# Patient Record
Sex: Female | Born: 1950 | Race: Black or African American | Hispanic: No | Marital: Married | Smoking: Former smoker
Health system: Southern US, Community
[De-identification: ages and names within clinical notes are randomized; demographics above are authoritative.]

## PROBLEM LIST (undated history)

## (undated) DIAGNOSIS — H269 Unspecified cataract: Secondary | ICD-10-CM

## (undated) DIAGNOSIS — H409 Unspecified glaucoma: Secondary | ICD-10-CM

## (undated) DIAGNOSIS — R109 Unspecified abdominal pain: Secondary | ICD-10-CM

## (undated) DIAGNOSIS — E559 Vitamin D deficiency, unspecified: Secondary | ICD-10-CM

## (undated) DIAGNOSIS — M1990 Unspecified osteoarthritis, unspecified site: Secondary | ICD-10-CM

## (undated) DIAGNOSIS — M549 Dorsalgia, unspecified: Secondary | ICD-10-CM

## (undated) DIAGNOSIS — E119 Type 2 diabetes mellitus without complications: Secondary | ICD-10-CM

## (undated) DIAGNOSIS — I10 Essential (primary) hypertension: Secondary | ICD-10-CM

## (undated) DIAGNOSIS — K219 Gastro-esophageal reflux disease without esophagitis: Secondary | ICD-10-CM

## (undated) HISTORY — DX: Unspecified glaucoma: H40.9

## (undated) HISTORY — PX: CHOLECYSTECTOMY: SHX55

## (undated) HISTORY — DX: Unspecified cataract: H26.9

## (undated) HISTORY — DX: Type 2 diabetes mellitus without complications: E11.9

## (undated) HISTORY — PX: CYST REMOVAL NECK: SHX6281

## (undated) HISTORY — PX: ABDOMINAL HYSTERECTOMY: SHX81

## (undated) NOTE — *Deleted (*Deleted)
[facility] [address] [place], [place] [address]  DATE OF SERVICE: March 02, 2020  CAROTID DOPPLER INTERPRETATION:  Bilateral Carotid Ultrsasound and Color Doppler Examination was performed. The RIGHT CCA shows mild to moderate plaque with right internal carotid artery being occluded mid to distal section. The LEFT CCA shows mild plaque in the vessel. There was no significant intimal thickening noted in the RIGHT carotid artery. There was no significant intimal thickening in the LEFT carotid artery.  The RIGHT CCA shows peak systolic velocity of 126 cm per second. The end diastolic velocity is 22 cm per second on the RIGHT side. The RIGHT ICA shows peak systolic velocity of 41 per second. RIGHT sided ICA end diastolic velocity is 15 cm per second. The RIGHT ECA shows a peak systolic velocity of 104 cm per second. The ICA/CCA ratio is calculated to be 0.3. This suggests less than 50% stenosis however the mid to distal internal carotid artery is fully occluded.. The Vertebral Artery shows antegrade flow.  The LEFT CCA shows peak systolic velocity of 118 cm per second. The end diastolic velocity is 36 cm per second on the LEFT side. The LEFT ICA shows peak systolic velocity of 80 per second. LEFT sided ICA end diastolic velocity is 22 cm per second. The LEFT ECA shows a peak systolic velocity of 112 cm per second. The ICA/CCA ratio is calculated to be 0.7. This suggests less than 50% stenosis. The Vertebral Artery shows antegrade flow.   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 The LEFT CAROTID shows less than 50% stenosis.  There is mild plaque formation noted on the LEFT and mild plaque with totally occluded internal carotid artery on the RIGHT  side. Consider a repeat Carotid doppler if clinical situation and symptoms warrant in 6-12 months. Patient should be encouraged to change lifestyles such as smoking  cessation, regular exercise and dietary modification. Use of statins in the right clinical setting and ASA is encouraged.  Saadat A Khan, MD FCCP Pulmonary Critical Care Medicine   

---

## 2008-02-11 ENCOUNTER — Ambulatory Visit: Payer: Self-pay

## 2009-01-30 ENCOUNTER — Ambulatory Visit: Payer: Self-pay | Admitting: Internal Medicine

## 2009-03-07 ENCOUNTER — Ambulatory Visit: Payer: Self-pay | Admitting: Internal Medicine

## 2009-04-09 ENCOUNTER — Ambulatory Visit: Payer: Self-pay | Admitting: Internal Medicine

## 2009-07-19 ENCOUNTER — Ambulatory Visit: Payer: Self-pay | Admitting: Gastroenterology

## 2010-03-22 ENCOUNTER — Ambulatory Visit: Payer: Self-pay

## 2010-06-25 ENCOUNTER — Ambulatory Visit: Payer: Self-pay | Admitting: Internal Medicine

## 2011-07-31 ENCOUNTER — Ambulatory Visit: Payer: Self-pay | Admitting: Internal Medicine

## 2011-08-11 ENCOUNTER — Ambulatory Visit: Payer: Self-pay | Admitting: Internal Medicine

## 2011-08-22 ENCOUNTER — Ambulatory Visit: Payer: Self-pay | Admitting: Emergency Medicine

## 2011-08-22 LAB — HEPATIC FUNCTION PANEL A (ARMC)
Albumin: 3.6 g/dL (ref 3.4–5.0)
Alkaline Phosphatase: 87 U/L (ref 50–136)
Bilirubin, Direct: <0.1 mg/dL (ref 0.00–0.20)
Bilirubin,Total: 0.6 mg/dL (ref 0.2–1.0)
SGOT(AST): 21 U/L (ref 15–37)
SGPT (ALT): 23 U/L
Total Protein: 7.5 g/dL (ref 6.4–8.2)

## 2011-08-28 ENCOUNTER — Ambulatory Visit: Payer: Self-pay | Admitting: Emergency Medicine

## 2011-09-01 LAB — PATHOLOGY REPORT

## 2012-01-23 ENCOUNTER — Ambulatory Visit: Payer: Self-pay | Admitting: Internal Medicine

## 2012-11-17 ENCOUNTER — Ambulatory Visit: Payer: Self-pay | Admitting: Internal Medicine

## 2012-12-29 ENCOUNTER — Emergency Department: Payer: Self-pay | Admitting: Emergency Medicine

## 2012-12-29 LAB — URINALYSIS, COMPLETE
Bacteria: NONE SEEN
Bilirubin,UR: NEGATIVE
Blood: NEGATIVE
Glucose,UR: NEGATIVE mg/dL (ref 0–75)
Ketone: NEGATIVE
Leukocyte Esterase: NEGATIVE
Nitrite: NEGATIVE
Ph: 7 (ref 4.5–8.0)
Protein: NEGATIVE
RBC,UR: 1 /HPF (ref 0–5)
Specific Gravity: 1.01 (ref 1.003–1.030)
Squamous Epithelial: 2
WBC UR: 1 /HPF (ref 0–5)

## 2012-12-29 LAB — COMPREHENSIVE METABOLIC PANEL
Albumin: 3.5 g/dL (ref 3.4–5.0)
Alkaline Phosphatase: 115 U/L (ref 50–136)
Anion Gap: 2 — ABNORMAL LOW (ref 7–16)
BUN: 8 mg/dL (ref 7–18)
Bilirubin,Total: 0.4 mg/dL (ref 0.2–1.0)
Calcium, Total: 8.7 mg/dL (ref 8.5–10.1)
Chloride: 110 mmol/L — ABNORMAL HIGH (ref 98–107)
Co2: 31 mmol/L (ref 21–32)
Creatinine: 0.67 mg/dL (ref 0.60–1.30)
EGFR (African American): >60
EGFR (Non-African Amer.): >60
Glucose: 97 mg/dL (ref 65–99)
Osmolality: 283 (ref 275–301)
Potassium: 3.8 mmol/L (ref 3.5–5.1)
SGOT(AST): 21 U/L (ref 15–37)
SGPT (ALT): 26 U/L (ref 12–78)
Sodium: 143 mmol/L (ref 136–145)
Total Protein: 7.3 g/dL (ref 6.4–8.2)

## 2012-12-29 LAB — CBC
HCT: 37.7 % (ref 35.0–47.0)
HGB: 12.6 g/dL (ref 12.0–16.0)
MCH: 26.6 pg (ref 26.0–34.0)
MCHC: 33.5 g/dL (ref 32.0–36.0)
MCV: 79 fL — ABNORMAL LOW (ref 80–100)
Platelet: 276 10*3/uL (ref 150–440)
RBC: 4.74 10*6/uL (ref 3.80–5.20)
RDW: 15 % — ABNORMAL HIGH (ref 11.5–14.5)
WBC: 7.6 10*3/uL (ref 3.6–11.0)

## 2013-05-19 HISTORY — PX: BREAST EXCISIONAL BIOPSY: SUR124

## 2013-12-02 ENCOUNTER — Ambulatory Visit: Payer: Self-pay | Admitting: Internal Medicine

## 2014-09-10 NOTE — Op Note (Signed)
PATIENT NAME:  Holly Forbes, Holly Forbes MR#:  [id number] DATE OF BIRTH:  1950-06-27  DATE OF PROCEDURE:  08/28/2011  PREOPERATIVE DIAGNOSES:  1. Acute cholecystitis.  2. Cyst on the left chest wall.   POSTOPERATIVE DIAGNOSES:  1. Acute cholecystitis.  2. Cyst on the left chest wall.   PROCEDURES: Excision of cyst on chest wall and laparoscopic cholecystectomy with cholangiogram.   SURGEON: Vella Kohler, MD   INDICATION: The patient was seen by me in my office because of right upper quadrant abdominal pain. She also had a cyst on the left breast skin which was a sebaceous cyst. I told her that we will remove it in surgery.   DESCRIPTION OF PROCEDURE: After she was brought to surgery, under general anesthesia, the abdomen was then prepped and draped. A small incision was made below the umbilicus. After cutting skin and subcutaneous tissue, the fascia was cut and the abdomen was entered. The fascia was then picked up with the sutures. After that the gallbladder was then found. She had a lot of adhesions of the cystic duct to the liver surface area. The cystic artery was small. By making dissection between the cystic duct and the gallbladder, I made a little hole at the distal end of the gallbladder so there was some bile leak from there, but basically the cystic duct was small. The patient had one stone. I tried to attempt to do a cholangiogram, but since there was a hole made near the cystic duct, near the junction of the gallbladder, dye kind of leaked out, so I could not really see the common bile duct, but I was very sure that I was away from it so dissection was done. The cystic duct was then isolated nicely and was then clipped and cut and the gallbladder was then lifted off from the liver. There was a little bit of oozing from the surface of the liver. It was cauterized and the gallbladder was then completely released and was removed from the epigastric port. After that irrigation of the gallbladder was  done and we made sure there was no bleeding or biliary leak noticed. After that the fascia was then closed with interrupted 0 Vicryl sutures. Subcuticular suturing was done and Steri-Strips applied.   After that attention was diverted to the left breast cyst, which is really a skin cyst. It was then completely excised.   The wound was then closed with subcuticular sutures with 4-0 Vicryl. The patient tolerated the procedure well and was sent to the recovery room in satisfactory condition.  ____________________________ Welford Roche Phylis Bougie, MD msh:slb D: 08/28/2011 08:52:20 ET T: 08/28/2011 11:26:27 ET JOB#: [id number]  cc: Mabell Esguerra S. Phylis Bougie, MD, <Dictator> Lavera Guise, MD Sharene Butters MD ELECTRONICALLY SIGNED 09/01/2011 17:16

## 2014-09-22 NOTE — Discharge Instructions (Signed)

## 2014-09-25 ENCOUNTER — Encounter
Admission: RE | Disposition: A | Discharge status: Home / Self Care | Payer: Self-pay | Source: Ambulatory Visit | Attending: Gastroenterology

## 2014-09-25 ENCOUNTER — Ambulatory Visit: Payer: 59 | Admitting: Anesthesiology

## 2014-09-25 ENCOUNTER — Ambulatory Visit
Admission: RE | Admit: 2014-09-25 | Discharge: 2014-09-25 | Disposition: A | Discharge status: Home / Self Care | Payer: 59 | Source: Ambulatory Visit | Attending: Gastroenterology | Admitting: Gastroenterology

## 2014-09-25 ENCOUNTER — Other Ambulatory Visit: Payer: Self-pay | Admitting: Gastroenterology

## 2014-09-25 DIAGNOSIS — E119 Type 2 diabetes mellitus without complications: Secondary | ICD-10-CM | POA: Diagnosis not present

## 2014-09-25 DIAGNOSIS — M1990 Unspecified osteoarthritis, unspecified site: Secondary | ICD-10-CM | POA: Insufficient documentation

## 2014-09-25 DIAGNOSIS — K2940 Chronic atrophic gastritis without bleeding: Secondary | ICD-10-CM | POA: Diagnosis not present

## 2014-09-25 DIAGNOSIS — K5730 Diverticulosis of large intestine without perforation or abscess without bleeding: Secondary | ICD-10-CM | POA: Insufficient documentation

## 2014-09-25 DIAGNOSIS — Z9049 Acquired absence of other specified parts of digestive tract: Secondary | ICD-10-CM | POA: Insufficient documentation

## 2014-09-25 DIAGNOSIS — K640 First degree hemorrhoids: Secondary | ICD-10-CM | POA: Insufficient documentation

## 2014-09-25 DIAGNOSIS — Z79899 Other long term (current) drug therapy: Secondary | ICD-10-CM | POA: Insufficient documentation

## 2014-09-25 DIAGNOSIS — K635 Polyp of colon: Secondary | ICD-10-CM | POA: Diagnosis not present

## 2014-09-25 DIAGNOSIS — Z7982 Long term (current) use of aspirin: Secondary | ICD-10-CM | POA: Insufficient documentation

## 2014-09-25 DIAGNOSIS — Z791 Long term (current) use of non-steroidal anti-inflammatories (NSAID): Secondary | ICD-10-CM | POA: Insufficient documentation

## 2014-09-25 DIAGNOSIS — Z800 Family history of malignant neoplasm of digestive organs: Secondary | ICD-10-CM | POA: Insufficient documentation

## 2014-09-25 DIAGNOSIS — Z1211 Encounter for screening for malignant neoplasm of colon: Secondary | ICD-10-CM | POA: Diagnosis not present

## 2014-09-25 DIAGNOSIS — I10 Essential (primary) hypertension: Secondary | ICD-10-CM | POA: Diagnosis not present

## 2014-09-25 DIAGNOSIS — E669 Obesity, unspecified: Secondary | ICD-10-CM | POA: Diagnosis not present

## 2014-09-25 DIAGNOSIS — Z90710 Acquired absence of both cervix and uterus: Secondary | ICD-10-CM | POA: Diagnosis not present

## 2014-09-25 DIAGNOSIS — K219 Gastro-esophageal reflux disease without esophagitis: Secondary | ICD-10-CM | POA: Diagnosis present

## 2014-09-25 HISTORY — DX: Essential (primary) hypertension: I10

## 2014-09-25 HISTORY — DX: Gastro-esophageal reflux disease without esophagitis: K21.9

## 2014-09-25 HISTORY — DX: Dorsalgia, unspecified: M54.9

## 2014-09-25 HISTORY — DX: Unspecified osteoarthritis, unspecified site: M19.90

## 2014-09-25 HISTORY — PX: COLONOSCOPY: SHX5424

## 2014-09-25 HISTORY — DX: Vitamin D deficiency, unspecified: E55.9

## 2014-09-25 HISTORY — DX: Unspecified abdominal pain: R10.9

## 2014-09-25 HISTORY — PX: POLYPECTOMY: SHX149

## 2014-09-25 HISTORY — PX: ESOPHAGOGASTRODUODENOSCOPY: SHX5428

## 2014-09-25 LAB — GLUCOSE, CAPILLARY: Glucose-Capillary: 125 mg/dL — ABNORMAL HIGH (ref 70–99)

## 2014-09-25 SURGERY — COLONOSCOPY
Anesthesia: Monitor Anesthesia Care | Wound class: Contaminated

## 2014-09-25 MED ORDER — PROPOFOL 10 MG/ML IV BOLUS
INTRAVENOUS | Status: DC | PRN
  Administered 2014-09-25 (×2): 40 mg via INTRAVENOUS
  Administered 2014-09-25: 20 mg via INTRAVENOUS
  Administered 2014-09-25 (×2): 80 mg via INTRAVENOUS
  Administered 2014-09-25: 60 mg via INTRAVENOUS

## 2014-09-25 MED ORDER — LIDOCAINE HCL (CARDIAC) 20 MG/ML IV SOLN
INTRAVENOUS | Status: DC | PRN
  Administered 2014-09-25: 30 mg via INTRAVENOUS

## 2014-09-25 MED ORDER — ACETAMINOPHEN 160 MG/5ML PO SOLN: 325.0000 mg | ORAL | Status: DC | PRN

## 2014-09-25 MED ORDER — SODIUM CHLORIDE 0.9 % IV SOLN
INTRAVENOUS | Status: DC
Start: 2014-09-25 — End: 2014-09-25

## 2014-09-25 MED ORDER — DEXAMETHASONE SODIUM PHOSPHATE 4 MG/ML IJ SOLN: 8.0000 mg | Freq: Once | INTRAMUSCULAR | Status: DC | PRN

## 2014-09-25 MED ORDER — FENTANYL CITRATE (PF) 100 MCG/2ML IJ SOLN: 25.0000 ug | INTRAMUSCULAR | Status: DC | PRN

## 2014-09-25 MED ORDER — ACETAMINOPHEN 325 MG PO TABS: 325.0000 mg | ORAL_TABLET | ORAL | Status: DC | PRN

## 2014-09-25 MED ORDER — OXYCODONE HCL 5 MG/5ML PO SOLN
5.0000 mg | Freq: Once | ORAL | Status: DC | PRN
Start: 2014-09-25 — End: 2014-09-25

## 2014-09-25 MED ORDER — LACTATED RINGERS IV SOLN
INTRAVENOUS | Status: DC
  Administered 2014-09-25 (×2): via INTRAVENOUS

## 2014-09-25 MED ORDER — OXYCODONE HCL 5 MG PO TABS: 5.0000 mg | ORAL_TABLET | Freq: Once | ORAL | Status: DC | PRN

## 2014-09-25 MED ORDER — STERILE WATER FOR IRRIGATION IR SOLN
Status: DC | PRN
  Administered 2014-09-25: 09:00:00

## 2014-09-25 MED ORDER — GLYCOPYRROLATE 0.2 MG/ML IJ SOLN
INTRAMUSCULAR | Status: DC | PRN
  Administered 2014-09-25: 0.2 mg via INTRAVENOUS

## 2014-09-25 SURGICAL SUPPLY — 38 items
BALLN DILATOR 10-12 8 (BALLOONS)
BALLN DILATOR 12-15 8 (BALLOONS)
BALLN DILATOR 15-18 8 (BALLOONS)
BALLN DILATOR CRE 0-12 8 (BALLOONS)
BALLN DILATOR ESOPH 8 10 CRE (MISCELLANEOUS) IMPLANT
BALLOON DILATOR 12-15 8 (BALLOONS) IMPLANT
BALLOON DILATOR 15-18 8 (BALLOONS) IMPLANT
BALLOON DILATOR CRE 0-12 8 (BALLOONS) IMPLANT
BLOCK BITE 60FR ADLT L/F GRN (MISCELLANEOUS) ×3 IMPLANT
CANISTER SUCT 1200ML W/VALVE (MISCELLANEOUS) ×3 IMPLANT
FCP ESCP3.2XJMB 240X2.8X (MISCELLANEOUS) ×2
FORCEPS BIOP RAD 4 LRG CAP 4 (CUTTING FORCEPS) IMPLANT
FORCEPS BIOP RJ4 240 W/NDL (MISCELLANEOUS) ×1
FORCEPS ESCP3.2XJMB 240X2.8X (MISCELLANEOUS) ×2 IMPLANT
GOWN CVR UNV OPN BCK APRN NK (MISCELLANEOUS) ×4 IMPLANT
GOWN ISOL THUMB LOOP REG UNIV (MISCELLANEOUS) ×2
HEMOCLIP INSTINCT (CLIP) IMPLANT
INJECTOR VARIJECT VIN23 (MISCELLANEOUS) IMPLANT
KIT CO2 TUBING (TUBING) ×3 IMPLANT
KIT DEFENDO VALVE AND CONN (KITS) IMPLANT
KIT ENDO PROCEDURE OLY (KITS) ×6 IMPLANT
LIGATOR MULTIBAND 6SHOOTER MBL (MISCELLANEOUS) IMPLANT
MARKER SPOT ENDO TATTOO 5ML (MISCELLANEOUS) IMPLANT
PAD GROUND ADULT SPLIT (MISCELLANEOUS) IMPLANT
SNARE SHORT THROW 13M SML OVAL (MISCELLANEOUS) IMPLANT
SNARE SHORT THROW 30M LRG OVAL (MISCELLANEOUS) IMPLANT
SPOT EX ENDOSCOPIC TATTOO (MISCELLANEOUS)
SUCTION POLY TRAP 4CHAMBER (MISCELLANEOUS) IMPLANT
SYR INFLATION 60ML (SYRINGE) IMPLANT
TRAP SUCTION POLY (MISCELLANEOUS) IMPLANT
TUBING CONN 6MMX3.1M (TUBING)
TUBING SUCTION CONN 0.25 STRL (TUBING) IMPLANT
UNDERPAD 30X60 958B10 (PK) (MISCELLANEOUS) IMPLANT
VALVE BIOPSY ENDO (VALVE) IMPLANT
VARIJECT INJECTOR VIN23 (MISCELLANEOUS)
WATER AUXILLARY (MISCELLANEOUS) IMPLANT
WATER STERILE IRR 500ML POUR (IV SOLUTION) ×3 IMPLANT
WIRE CRE 18-20MM 8CM F G (MISCELLANEOUS) IMPLANT

## 2014-09-25 NOTE — Op Note (Signed)
[facility] Gastroenterology Patient Name: Holly Forbes Procedure Date: 09/25/2014 8:37 AM MRN: [id number] Account #: [id number] Date of Birth: 11-06-1950 Admit Type: Outpatient Age: 64 Room: [facility] OR ROOM 01 Gender: Female Note Status: Finalized Procedure:         Upper GI endoscopy Indications:       Heartburn Providers:         Lucilla Lame, MD Referring MD:      Lavera Guise, MD (Referring MD) Medicines:         Propofol per Anesthesia Complications:     No immediate complications. Procedure:         Pre-Anesthesia Assessment:                    - Prior to the procedure, a History and Physical was                     performed, and patient medications and allergies were                     reviewed. The patient's tolerance of previous anesthesia                     was also reviewed. The risks and benefits of the procedure                     and the sedation options and risks were discussed with the                     patient. All questions were answered, and informed consent                     was obtained. Prior Anticoagulants: The patient has taken                     no previous anticoagulant or antiplatelet agents. ASA                     Grade Assessment: II - A patient with mild systemic                     disease. After reviewing the risks and benefits, the                     patient was deemed in satisfactory condition to undergo                     the procedure.                    After obtaining informed consent, the endoscope was passed                     under direct vision. Throughout the procedure, the                     patient's blood pressure, pulse, and oxygen saturations                     were monitored continuously. The Olympus GIF-HQ190                     Endoscope (S#. [id number]) was introduced through the mouth,  and advanced to the second part of duodenum. The upper GI                     endoscopy was  accomplished without difficulty. The patient                     tolerated the procedure well. Findings:      The examined esophagus was normal.      Localized mild inflammation characterized by erythema was found in the       gastric antrum. Biopsies were taken with a cold forceps for histology.      The examined duodenum was normal. Impression:        - Normal esophagus.                    - Gastritis. Biopsied.                    - Normal examined duodenum. Recommendation:    - Await pathology results.                    - Perform a colonoscopy today. Procedure Code(s): --- Professional ---                    [id number], Esophagogastroduodenoscopy, flexible, transoral;                     with biopsy, single or multiple Diagnosis Code(s): --- Professional ---                    R12, Heartburn                    K29.70, Gastritis, unspecified, without bleeding CPT copyright 2014 American Medical Association. All rights reserved. The codes documented in this report are preliminary and upon coder review may  be revised to meet current compliance requirements. Lucilla Lame, MD 09/25/2014 8:48:50 AM This report has been signed electronically. Number of Addenda: 0 Note Initiated On: 09/25/2014 8:37 AM Total Procedure Duration: 0 hours 1 minute 59 seconds       [facility]

## 2014-09-25 NOTE — H&P (Signed)
  [facility]  [address] [place], [place] [address] Phone: [phone] Fax : [phone]  Primary Care Physician:  Lavera Guise, MD Primary Gastroenterologist:  Dr. Allen Norris  Pre-Procedure History & Physical: HPI:  Holly Forbes is a 64 y.o. female is here for an endoscopy and colonoscopy.   Past Medical History  Diagnosis Date  . Hypertension   . 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 Back pain     Left sciatica, low back pain  . GERD (gastroesophageal reflux disease)   . Vitamin D deficiency   . Abdominal pain     Past Surgical History  Procedure Laterality Date  . Cholecystectomy    . Abdominal hysterectomy    . Cyst removal neck      Prior to Admission medications   Medication Sig Start Date End Date Taking? Authorizing Provider  aspirin 81 MG tablet Take 81 mg by mouth daily. am   Yes Historical Provider, MD  ibuprofen (ADVIL,MOTRIN) 400 MG tablet Take 400 mg by mouth as needed for moderate pain. 2 times/day   Yes Historical Provider, MD  lisinopril (PRINIVIL,ZESTRIL) 20 MG tablet Take 20 mg by mouth daily. am   Yes Historical Provider, MD  metFORMIN (GLUCOPHAGE) 500 MG tablet Take 500 mg by mouth daily. Mid-day   Yes Historical Provider, MD  Multiple Vitamin (MULTIVITAMIN) tablet Take 1 tablet by mouth daily. Doesn't take everyday   Yes Historical Provider, MD  pantoprazole (PROTONIX) 40 MG tablet Take 40 mg by mouth daily.   Yes Historical Provider, MD  VITAMIN D, CHOLECALCIFEROL, PO Take 1 Dose by mouth daily. Not on routine basis   Yes Historical Provider, MD    Allergies as of 09/19/2014  . (Not on File)    History reviewed. No pertinent family history.  History   Social History  . Marital Status: Married    Spouse Name: N/A  . Number of Children: N/A  . Years of Education: N/A   Occupational History  . Not on file.   Social History Main Topics  . Smoking status: Former Smoker -- 0.25 packs/day for 15 years    Types:  Cigarettes  . Smokeless tobacco: Not on file  . Alcohol Use: Yes     Comment: 3-5 glasses wine/month  . Drug Use: Not on file  . Sexual Activity: Not on file   Other Topics Concern  . Not on file   Social History Narrative    Review of Systems: See HPI, otherwise negative ROS  Physical Exam: BP 132/68 mmHg  Pulse 68  Temp(Src) 96.8 F (36 C) (Oral)  Resp 16  Ht 5\' 7"  (1.702 m)  Wt 211 lb (95.709 kg)  BMI 33.04 kg/m2  SpO2 99% General:   Alert,  pleasant and cooperative in NAD Head:  Normocephalic and atraumatic. Neck:  Supple; no masses or thyromegaly. Lungs:  Clear throughout to auscultation.    Heart:  Regular rate and rhythm. Abdomen:  Soft, nontender and nondistended. Normal bowel sounds, without guarding, and without rebound.   Neurologic:  Alert and  oriented x4;  grossly normal neurologically.  Impression/Plan: Holly Forbes is here for an endoscopy and colonoscopy to be performed for GERD, family history of colon cancer and personal histroy of polyps.  Risks, benefits, limitations, and alternatives regarding  endoscopy and colonoscopy have been reviewed with the patient.  Questions have been answered.  All parties agreeable.   [facility], MD  09/25/2014, 8:09 AM

## 2014-09-25 NOTE — Op Note (Signed)
[facility] Gastroenterology Patient Name: Holly Forbes Procedure Date: 09/25/2014 8:37 AM MRN: [id number] Account #: [id number] Date of Birth: 06/06/1950 Admit Type: Outpatient Age: 64 Room: [facility] OR ROOM 01 Gender: Female Note Status: Finalized Procedure:         Colonoscopy Indications:       High risk colon cancer surveillance: Personal history of                     colonic polyps, Family history of colon cancer in a                     first-degree relative Providers:         Lucilla Lame, MD Referring MD:      Lavera Guise, MD (Referring MD) Medicines:         Propofol per Anesthesia Complications:     No immediate complications. Procedure:         Pre-Anesthesia Assessment:                    - Prior to the procedure, a History and Physical was                     performed, and patient medications and allergies were                     reviewed. The patient's tolerance of previous anesthesia                     was also reviewed. The risks and benefits of the procedure                     and the sedation options and risks were discussed with the                     patient. All questions were answered, and informed consent                     was obtained. Prior Anticoagulants: The patient has taken                     no previous anticoagulant or antiplatelet agents. ASA                     Grade Assessment: II - A patient with mild systemic                     disease. After reviewing the risks and benefits, the                     patient was deemed in satisfactory condition to undergo                     the procedure.                    After obtaining informed consent, the colonoscope was                     passed under direct vision. Throughout the procedure, the                     patient's blood pressure, pulse, and oxygen saturations  were monitored continuously. The Olympus CF-HQ190L                     Colonoscope (S#.  [id number]) was introduced through the anus                     and advanced to the the cecum, identified by appendiceal                     orifice and ileocecal valve. The colonoscopy was performed                     without difficulty. The patient tolerated the procedure                     well. The quality of the bowel preparation was excellent. Findings:      The perianal and digital rectal examinations were normal.      A 3 mm polyp was found in the sigmoid colon. The polyp was sessile. The       polyp was removed with a cold biopsy forceps. Resection and retrieval       were complete.      Non-bleeding internal hemorrhoids were found during retroflexion. The       hemorrhoids were Grade I (internal hemorrhoids that do not prolapse).      Multiple small-mouthed diverticula were found in the sigmoid colon. Impression:        - One 3 mm polyp in the sigmoid colon. Resected and                     retrieved.                    - Non-bleeding internal hemorrhoids.                    - Diverticulosis in the sigmoid colon. Recommendation:    - Await pathology results.                    - Repeat colonoscopy in 5 years for surveillance. Procedure Code(s): --- Professional ---                    [phone], Colonoscopy, flexible; with biopsy, single or                     multiple Diagnosis Code(s): --- Professional ---                    Z80.0, Family history of malignant neoplasm of digestive                     organs                    Z86.010, Personal history of colonic polyps                    D12.5, Benign neoplasm of sigmoid colon CPT copyright 2014 American Medical Association. All rights reserved. The codes documented in this report are preliminary and upon coder review may  be revised to meet current compliance requirements. Lucilla Lame, MD 09/25/2014 9:04:26 AM This report has been signed electronically. Number of Addenda: 0 Note Initiated On: 09/25/2014 8:37 AM Scope Withdrawal  Time: 0 hours 9 minutes 37 seconds  Total Procedure Duration: 0 hours 10 minutes 33 seconds  [facility]

## 2014-09-25 NOTE — Anesthesia Preprocedure Evaluation (Addendum)
Anesthesia Evaluation  Patient identified by MRN, date of birth, ID band Patient awake    Reviewed: Allergy & Precautions, H&P , NPO status , Patient's Chart, lab work & pertinent test results, reviewed documented beta blocker date and time   Airway Mallampati: III  TM Distance: >3 FB Neck ROM: full    Dental no notable dental hx.    Pulmonary former smoker,  breath sounds clear to auscultation  Pulmonary exam normal       Cardiovascular Exercise Tolerance: Good hypertension, Rhythm:regular Rate:Normal     Neuro/Psych negative neurological ROS  negative psych ROS   GI/Hepatic Neg liver ROS, GERD-  Medicated,  Endo/Other  negative endocrine ROS  Renal/GU negative Renal ROS  negative genitourinary   Musculoskeletal   Abdominal   Peds  Hematology negative hematology ROS (+)   Anesthesia Other Findings   Reproductive/Obstetrics negative OB ROS                            Anesthesia Physical Anesthesia Plan  ASA: II  Anesthesia Plan: MAC   Post-op Pain Management:    Induction:   Airway Management Planned:   Additional Equipment:   Intra-op Plan:   Post-operative Plan:   Informed Consent: I have reviewed the patients History and Physical, chart, labs and discussed the procedure including the risks, benefits and alternatives for the proposed anesthesia with the patient or authorized representative who has indicated his/her understanding and acceptance.     Plan Discussed with: CRNA  Anesthesia Plan Comments:         Anesthesia Quick Evaluation

## 2014-09-25 NOTE — Anesthesia Procedure Notes (Signed)
Procedure Name: MAC Performed by: Zaylon Bossier Pre-anesthesia Checklist: Patient identified, Emergency Drugs available, Suction available, Patient being monitored and Timeout performed Patient Re-evaluated:Patient Re-evaluated prior to inductionOxygen Delivery Method: Nasal cannula       

## 2014-09-25 NOTE — Anesthesia Postprocedure Evaluation (Signed)
  Anesthesia Post-op Note  Patient: Holly Forbes  Procedure(s) Performed: Procedure(s) with comments: COLONOSCOPY (N/A) - cecum time- 0852 ESOPHAGOGASTRODUODENOSCOPY (EGD) (N/A) POLYPECTOMY INTESTINAL  Anesthesia type:MAC  Patient location: PACU  Post pain: Pain level controlled  Post assessment: Post-op Vital signs reviewed, Patient's Cardiovascular Status Stable, Respiratory Function Stable, Patent Airway and No signs of Nausea or vomiting  Post vital signs: Reviewed and stable  Last Vitals:  Filed Vitals:   09/25/14 0905  BP: 100/51  Pulse:   Temp:   Resp:     Level of consciousness: awake, alert  and patient cooperative  Complications: No apparent anesthesia complications

## 2014-09-25 NOTE — Transfer of Care (Signed)
Immediate Anesthesia Transfer of Care Note  Patient: Holly Forbes  Procedure(s) Performed: Procedure(s) with comments: COLONOSCOPY (N/A) - cecum time- 0852 ESOPHAGOGASTRODUODENOSCOPY (EGD) (N/A) POLYPECTOMY INTESTINAL  Patient Location: PACU  Anesthesia Type: MAC  Level of Consciousness: awake, alert  and patient cooperative  Airway and Oxygen Therapy: Patient Spontanous Breathing and Patient connected to supplemental oxygen  Post-op Assessment: Post-op Vital signs reviewed, Patient's Cardiovascular Status Stable, Respiratory Function Stable, Patent Airway and No signs of Nausea or vomiting  Post-op Vital Signs: Reviewed and stable  Complications: No apparent anesthesia complications

## 2014-09-27 ENCOUNTER — Encounter: Payer: Self-pay | Admitting: Gastroenterology

## 2015-04-19 ENCOUNTER — Other Ambulatory Visit: Payer: Self-pay

## 2015-04-19 DIAGNOSIS — K2100 Gastro-esophageal reflux disease with esophagitis, without bleeding: Secondary | ICD-10-CM

## 2015-04-19 MED ORDER — PANTOPRAZOLE SODIUM 40 MG PO TBEC: 40.0000 mg | DELAYED_RELEASE_TABLET | Freq: Every day | ORAL | Status: DC

## 2015-05-01 ENCOUNTER — Ambulatory Visit (INDEPENDENT_AMBULATORY_CARE_PROVIDER_SITE_OTHER): Payer: 59 | Admitting: Sports Medicine

## 2015-05-01 ENCOUNTER — Encounter: Payer: Self-pay | Admitting: Sports Medicine

## 2015-05-01 ENCOUNTER — Ambulatory Visit (INDEPENDENT_AMBULATORY_CARE_PROVIDER_SITE_OTHER): Payer: 59

## 2015-05-01 DIAGNOSIS — M79671 Pain in right foot: Secondary | ICD-10-CM

## 2015-05-01 DIAGNOSIS — S93601A Unspecified sprain of right foot, initial encounter: Secondary | ICD-10-CM

## 2015-05-01 DIAGNOSIS — M7750 Other enthesopathy of unspecified foot: Secondary | ICD-10-CM

## 2015-05-01 DIAGNOSIS — M6588 Other synovitis and tenosynovitis, other site: Secondary | ICD-10-CM | POA: Diagnosis not present

## 2015-05-01 MED ORDER — TRIAMCINOLONE ACETONIDE 10 MG/ML IJ SUSP
10.0000 mg | Freq: Once | INTRAMUSCULAR | Status: DC
Start: 2015-05-01 — End: 2023-08-13

## 2015-05-01 NOTE — Progress Notes (Signed)
Patient ID: Jackquline Branca, female   DOB: January 13, 1951, 64 y.o.   MRN: [id number] Subjective: Oletta Buehring is a 64 y.o. female patient who presents to office for evaluation of Right foot pain. Patient complains of  pain especially over the last few weeks in the Right foot at the lateral side; reports that on Thanksgiving she inverted her ankle getting into car. Report that pain has gotten a little better than it initaly was but is concerned because pain is still present . Patient has tried ace wrap and limited activity with no relief in symptoms. Admits to history of repeated sprains and instability/ Patient denies any other pedal complaints. Denies any other causative factors.   FBS not recorded; patient states that she is not a diabetic but is on metformin so she wont become diabetic; states A1C is ~6.2.  There are no active problems to display for this patient.  Current Outpatient Prescriptions on File Prior to Visit  Medication Sig Dispense Refill  . aspirin 81 MG tablet Take 81 mg by mouth daily. am    . ibuprofen (ADVIL,MOTRIN) 400 MG tablet Take 400 mg by mouth as needed for moderate pain. 2 times/day    . lisinopril (PRINIVIL,ZESTRIL) 20 MG tablet Take 20 mg by mouth daily. am    . metFORMIN (GLUCOPHAGE) 500 MG tablet Take 500 mg by mouth daily. Mid-day    . Multiple Vitamin (MULTIVITAMIN) tablet Take 1 tablet by mouth daily. Doesn't take everyday    . pantoprazole (PROTONIX) 40 MG tablet Take 1 tablet (40 mg total) by mouth daily. 30 tablet 11  . VITAMIN D, CHOLECALCIFEROL, PO Take 1 Dose by mouth daily. Not on routine basis     No current facility-administered medications on file prior to visit.   No Known Allergies   Objective:  General: Alert and oriented x3 in no acute distress  Dermatology: No open lesions bilateral lower extremities, no webspace macerations, no ecchymosis bilateral, all nails x 10 are well manicured.  Vascular: Dorsalis Pedis and Posterior Tibial pedal  pulses 2/4, Capillary Fill Time 3 seconds,(+) pedal hair growth bilateral, mild focal edema to lateral foot and ankle on right, Temperature gradient within normal limits.  Neurology: Gross sensation intact via light touch bilateral, Protective sensation intact  with Semmes Weinstein Monofilament to all pedal sites, Position sense intact, vibratory intact bilateral, Deep tendon reflexes within normal limits bilateral, No babinski sign present bilateral. (- )Tinels sign right foot.   Musculoskeletal: There is tenderness with palpation at 5th metatarsal base on Right,No instability or pain with palpation to lateral ankle ligaments or sinus tarsi, no pain with calf compression bilateral. Ankle and pedal joint range of motion is within normal limits however there is mild guarding on inversion, there is no 1st ray hypermobility noted bilateral, decreased 1st MPJ rom Right>Left with functional limitus noted on weightbearing exam, planus foot type bilateral.Strength within normal limits in all groups bilateral.   Xrays  Right Foot 3 views    Impression:Normal osseous mineralization, joint spaces within normal limits. Mild dorsal spur at 1st MTPJ. No fracture/dislocation. Mild midfoot breach with dorsal osteophytes consistent with mild osteoarthritis and planus foot type, soft tissues within normal limits, No foreign body.   Assessment and Plan: Problem List Items Addressed This Visit    None    Visit Diagnoses    Tendonitis of foot    -  Primary    right peroneous brevis    Relevant Medications    triamcinolone acetonide (KENALOG) 10 MG/ML  injection 10 mg    Right foot pain        Relevant Medications    triamcinolone acetonide (KENALOG) 10 MG/ML injection 10 mg    Other Relevant Orders    DG Foot Complete Right    Sprain of foot, right, initial encounter           -Complete examination performed -Xrays reviewed -Discussed treatement options for likely sprain and PB tendonitis -After verbal  consent inject along right PB tendon sheath and at point of max tenderness at 5th met base 2cc mixture of 2% lidocaine plain, 0.5% marcaine plain, kenalog 10, and dexamethasone phosphate without complication; post injection care discussed with patient -Dispensed CAM boot to wear at all times when ambulating -Recommend to continue with OTC motrin as needed -Recommend ice and elevation 1-2x daily -May consider at next visit transitioning to brace and PT and other treatment options since history of repeat ankle/foot sprains.  -Patient to return to office in 2 weeks for follow up evaluation or sooner if condition worsens.  Landis Martins, DPM

## 2015-05-22 ENCOUNTER — Encounter: Payer: Self-pay | Admitting: Sports Medicine

## 2015-05-22 ENCOUNTER — Ambulatory Visit (INDEPENDENT_AMBULATORY_CARE_PROVIDER_SITE_OTHER): Payer: BLUE CROSS/BLUE SHIELD | Admitting: Sports Medicine

## 2015-05-22 DIAGNOSIS — M79671 Pain in right foot: Secondary | ICD-10-CM | POA: Diagnosis not present

## 2015-05-22 DIAGNOSIS — S93601D Unspecified sprain of right foot, subsequent encounter: Secondary | ICD-10-CM | POA: Diagnosis not present

## 2015-05-22 DIAGNOSIS — M6588 Other synovitis and tenosynovitis, other site: Secondary | ICD-10-CM

## 2015-05-22 DIAGNOSIS — M7750 Other enthesopathy of unspecified foot: Secondary | ICD-10-CM

## 2015-05-22 NOTE — Progress Notes (Signed)
Patient ID: Holly Forbes, female   DOB: 1950/08/20, 65 y.o.   MRN: [id number]  Subjective: Holly Forbes is a 65 y.o. female patient who returns to office for follow up evaluation of Right foot pain. Patient states that the pain is feeling better after the injection and wearing boot; admits to occasional throbbing and some soft tissue swelling. Patient denies any other pedal complaints.   FBS not recorded; patient states that she is not a diabetic but is on metformin so she wont become diabetic; states A1C is ~6.2.  There are no active problems to display for this patient.  Current Outpatient Prescriptions on File Prior to Visit  Medication Sig Dispense Refill  . aspirin 81 MG tablet Take 81 mg by mouth daily. am    . ibuprofen (ADVIL,MOTRIN) 400 MG tablet Take 400 mg by mouth as needed for moderate pain. 2 times/day    . lisinopril (PRINIVIL,ZESTRIL) 20 MG tablet Take 20 mg by mouth daily. am    . metFORMIN (GLUCOPHAGE) 500 MG tablet Take 500 mg by mouth daily. Mid-day    . Multiple Vitamin (MULTIVITAMIN) tablet Take 1 tablet by mouth daily. Doesn't take everyday    . pantoprazole (PROTONIX) 40 MG tablet Take 1 tablet (40 mg total) by mouth daily. 30 tablet 11  . VITAMIN D, CHOLECALCIFEROL, PO Take 1 Dose by mouth daily. Not on routine basis     Current Facility-Administered Medications on File Prior to Visit  Medication Dose Route Frequency Provider Last Rate Last Dose  . triamcinolone acetonide (KENALOG) 10 MG/ML injection 10 mg  10 mg Other Once [employer], DPM       No Known Allergies   Objective:  General: Alert and oriented x3 in no acute distress  Dermatology: No open lesions bilateral lower extremities, no webspace macerations, no ecchymosis bilateral, all nails x 10 are well manicured.  Vascular: Dorsalis Pedis and Posterior Tibial pedal pulses 2/4, Capillary Fill Time 3 seconds,(+) pedal hair growth bilateral, mild focal edema to lateral foot and ankle on right,  Temperature gradient within normal limits.  Neurology: Gross sensation intact via light touch bilateral, Protective sensation intact  with Semmes Weinstein Monofilament to all pedal sites, Position sense intact, vibratory intact bilateral, Deep tendon reflexes within normal limits bilateral, No babinski sign present bilateral. (- )Tinels sign right foot.   Musculoskeletal: There is no tenderness with palpation at 5th metatarsal base on Right,No instability or pain with palpation to lateral ankle ligaments or sinus tarsi, no pain with calf compression bilateral. Ankle and pedal joint range of motion is within normal limits no guarding on inversion, there is no 1st ray hypermobility noted bilateral, decreased 1st MPJ rom Right>Left with functional limitus noted on weightbearing exam, planus foot type bilateral.Strength within normal limits in all groups bilateral.    Assessment and Plan: Problem List Items Addressed This Visit    None    Visit Diagnoses    Right foot pain    -  Primary    Tendonitis of foot        Sprain of foot, right, subsequent encounter           -Complete examination performed -Previous Xrays reviewed -Discussed treatement options for sprain and PB tendonitis -Cont with CAM boot for the next week with transisition to [place] to wear at all times when ambulating with good supportive shoes -Recommend to continue with OTC motrin as needed -Recommend ice and elevation 1-2x daily -May consider PT and other treatment options since history  of repeat ankle/foot sprains at next encounter depending on how patient does with brace.  -Patient to return to office in 2 weeks for follow up evaluation or sooner if condition worsens.  Landis Martins, DPM

## 2015-06-05 ENCOUNTER — Encounter: Payer: Self-pay | Admitting: Sports Medicine

## 2015-06-05 ENCOUNTER — Ambulatory Visit (INDEPENDENT_AMBULATORY_CARE_PROVIDER_SITE_OTHER): Payer: BLUE CROSS/BLUE SHIELD | Admitting: Sports Medicine

## 2015-06-05 DIAGNOSIS — M7750 Other enthesopathy of unspecified foot: Secondary | ICD-10-CM

## 2015-06-05 DIAGNOSIS — S93601D Unspecified sprain of right foot, subsequent encounter: Secondary | ICD-10-CM | POA: Diagnosis not present

## 2015-06-05 DIAGNOSIS — M6588 Other synovitis and tenosynovitis, other site: Secondary | ICD-10-CM | POA: Diagnosis not present

## 2015-06-05 DIAGNOSIS — M79671 Pain in right foot: Secondary | ICD-10-CM | POA: Diagnosis not present

## 2015-06-05 NOTE — Progress Notes (Signed)
Patient ID: Sharn Parlin, female   DOB: 1950/12/17, 65 y.o.   MRN: [id number]  Subjective: Holly Forbes is a 65 y.o. female patient who returns to office for follow up evaluation of Right foot pain. Patient states that she is not having any pain in the foot; 100% better; states that she has even stopped wearing the brace. Patient denies any other pedal complaints.    There are no active problems to display for this patient.  Current Outpatient Prescriptions on File Prior to Visit  Medication Sig Dispense Refill  . aspirin 81 MG tablet Take 81 mg by mouth daily. am    . ibuprofen (ADVIL,MOTRIN) 400 MG tablet Take 400 mg by mouth as needed for moderate pain. 2 times/day    . lisinopril (PRINIVIL,ZESTRIL) 20 MG tablet Take 20 mg by mouth daily. am    . metFORMIN (GLUCOPHAGE) 500 MG tablet Take 500 mg by mouth daily. Mid-day    . Multiple Vitamin (MULTIVITAMIN) tablet Take 1 tablet by mouth daily. Doesn't take everyday    . pantoprazole (PROTONIX) 40 MG tablet Take 1 tablet (40 mg total) by mouth daily. 30 tablet 11  . VITAMIN D, CHOLECALCIFEROL, PO Take 1 Dose by mouth daily. Not on routine basis     Current Facility-Administered Medications on File Prior to Visit  Medication Dose Route Frequency Provider Last Rate Last Dose  . triamcinolone acetonide (KENALOG) 10 MG/ML injection 10 mg  10 mg Other Once [employer], DPM       No Known Allergies   Objective:  General: Alert and oriented x3 in no acute distress  Dermatology: No open lesions bilateral lower extremities, no webspace macerations, no ecchymosis bilateral, all nails x 10 are well manicured.  Vascular: Dorsalis Pedis and Posterior Tibial pedal pulses 2/4, Capillary Fill Time 3 seconds,(+) pedal hair growth bilateral, mild focal edema to lateral foot and ankle on right, Temperature gradient within normal limits.  Neurology: Gross sensation intact via light touch bilateral, Protective sensation intact  with Semmes Weinstein  Monofilament to all pedal sites, Position sense intact, vibratory intact bilateral, Deep tendon reflexes within normal limits bilateral, No babinski sign present bilateral. (- )Tinels sign right foot.   Musculoskeletal: There is no tenderness with palpation at 5th metatarsal base on Right,No instability or pain with palpation to lateral ankle ligaments or sinus tarsi, no pain with calf compression bilateral. Ankle and pedal joint range of motion is within normal limits no guarding on inversion, there is no 1st ray hypermobility noted bilateral, decreased 1st MPJ rom Right>Left with functional limitus noted on weightbearing exam, planus foot type bilateral.Strength within normal limits in all groups bilateral.    Assessment and Plan: Problem List Items Addressed This Visit    None    Visit Diagnoses    Right foot pain    -  Primary    Tendonitis of foot        Sprain of foot, right, subsequent encounter           -Complete examination performed -Discussed long term care for sprain and tendonitis -Patient may wean from brace and return to activities as tolerated -Advised patient that if symptoms recur to ice, elevate, wear brace and return for re-eval. No PT at this time since patient is doing well with no residual symptoms. -Patient to return to office as needed or sooner if condition worsens.  Landis Martins, DPM

## 2015-06-28 ENCOUNTER — Emergency Department: Payer: BLUE CROSS/BLUE SHIELD

## 2015-06-28 ENCOUNTER — Emergency Department
Admission: EM | Admit: 2015-06-28 | Discharge: 2015-06-28 | Disposition: A | Discharge status: Home / Self Care | Payer: BLUE CROSS/BLUE SHIELD | Attending: Student | Admitting: Student

## 2015-06-28 ENCOUNTER — Encounter: Payer: Self-pay | Admitting: Emergency Medicine

## 2015-06-28 DIAGNOSIS — IMO0001 Reserved for inherently not codable concepts without codable children: Secondary | ICD-10-CM

## 2015-06-28 DIAGNOSIS — S3210XA Unspecified fracture of sacrum, initial encounter for closed fracture: Secondary | ICD-10-CM

## 2015-06-28 DIAGNOSIS — Z79899 Other long term (current) drug therapy: Secondary | ICD-10-CM | POA: Insufficient documentation

## 2015-06-28 DIAGNOSIS — Y92009 Unspecified place in unspecified non-institutional (private) residence as the place of occurrence of the external cause: Secondary | ICD-10-CM | POA: Insufficient documentation

## 2015-06-28 DIAGNOSIS — S322XXA Fracture of coccyx, initial encounter for closed fracture: Secondary | ICD-10-CM | POA: Insufficient documentation

## 2015-06-28 DIAGNOSIS — Z7984 Long term (current) use of oral hypoglycemic drugs: Secondary | ICD-10-CM | POA: Diagnosis not present

## 2015-06-28 DIAGNOSIS — Y998 Other external cause status: Secondary | ICD-10-CM | POA: Diagnosis not present

## 2015-06-28 DIAGNOSIS — Z7982 Long term (current) use of aspirin: Secondary | ICD-10-CM | POA: Diagnosis not present

## 2015-06-28 DIAGNOSIS — Y9389 Activity, other specified: Secondary | ICD-10-CM | POA: Diagnosis not present

## 2015-06-28 DIAGNOSIS — I10 Essential (primary) hypertension: Secondary | ICD-10-CM | POA: Insufficient documentation

## 2015-06-28 DIAGNOSIS — Z87891 Personal history of nicotine dependence: Secondary | ICD-10-CM | POA: Diagnosis not present

## 2015-06-28 DIAGNOSIS — M62830 Muscle spasm of back: Secondary | ICD-10-CM

## 2015-06-28 DIAGNOSIS — W010XXA Fall on same level from slipping, tripping and stumbling without subsequent striking against object, initial encounter: Secondary | ICD-10-CM | POA: Diagnosis not present

## 2015-06-28 DIAGNOSIS — S3992XA Unspecified injury of lower back, initial encounter: Secondary | ICD-10-CM | POA: Diagnosis present

## 2015-06-28 MED ORDER — HYDROCODONE-ACETAMINOPHEN 5-325 MG PO TABS
2.0000 | ORAL_TABLET | Freq: Once | ORAL | Status: AC
  Administered 2015-06-28: 2 via ORAL
  Filled 2015-06-28: qty 2

## 2015-06-28 MED ORDER — CYCLOBENZAPRINE HCL 5 MG PO TABS: 5.0000 mg | ORAL_TABLET | Freq: Three times a day (TID) | ORAL | Status: AC | PRN

## 2015-06-28 MED ORDER — HYDROCODONE-ACETAMINOPHEN 5-325 MG PO TABS: 1.0000 | ORAL_TABLET | Freq: Four times a day (QID) | ORAL | Status: DC | PRN

## 2015-06-28 NOTE — Discharge Instructions (Signed)
Tailbone Injury The tailbone (coccyx) is the small bone at the lower end of the spine. A tailbone injury may involve stretched ligaments, bruising, or a broken bone (fracture). Tailbone injuries can be painful, and some may take a long time to heal. CAUSES This condition is often caused by falling and landing on the tailbone. Other causes include:  Repeated strain or friction from actions such as rowing and bicycling.  Childbirth. In some cases, the cause may not be known. RISK FACTORS This condition is more common in women than in men. SYMPTOMS Symptoms of this condition include:  Pain in the lower back, especially when sitting.  Pain or difficulty when standing up from a sitting position.  Bruising in the tailbone area.  Painful bowel movements.  In women, pain during intercourse. DIAGNOSIS This condition may be diagnosed based on your symptoms and a physical exam. X-rays may be taken if a fracture is suspected. You may also have other tests, such as a CT scan or MRI. TREATMENT This condition may be treated with medicines to help relieve your pain. Most tailbone injuries heal on their own in 4-6 weeks. However, recovery time may be longer if the injury involves a fracture. HOME CARE INSTRUCTIONS  Take medicines only as directed by your health care provider.  If directed, apply ice to the injured area:  Put ice in a plastic bag.  Place a towel between your skin and the bag.  Leave the ice on for 20 minutes, 2-3 times per day for the first 1-2 days.  Sit on a large, rubber or inflated ring or cushion to ease your pain. Lean forward when you are sitting to help decrease discomfort.  Avoid sitting for long periods of time.  Increase your activity as the pain allows. Perform any exercises that are recommended by your health care provider or physical therapist.  If you have pain during bowel movements, use stool softeners as directed by your health care provider.  Eat a  diet that includes plenty of fiber to help prevent constipation.  Keep all follow-up visits as directed by your health care provider. This is important. PREVENTION Wear appropriate padding and sports gear when bicycling and rowing. This can help to prevent developing an injury that is caused by repeated strain or friction. SEEK MEDICAL CARE IF:  Your pain becomes worse.  Your bowel movements cause a great deal of discomfort.  You are unable to have a bowel movement.  You have uncontrolled urine loss (urinary incontinence).  You have a fever.   This information is not intended to replace advice given to you by your health care provider. Make sure you discuss any questions you have with your health care provider.   Document Released: 05/02/2000 Document Revised: 09/19/2014 Document Reviewed: 05/01/2014 Elsevier Interactive Patient Education 2016 [employer]  Muscle Cramps and Spasms Muscle cramps and spasms occur when a muscle or muscles tighten and you have no control over this tightening (involuntary muscle contraction). They are a common problem and can develop in any muscle. The most common place is in the calf muscles of the leg. Both muscle cramps and muscle spasms are involuntary muscle contractions, but they also have differences:   Muscle cramps are sporadic and painful. They may last a few seconds to a quarter of an hour. Muscle cramps are often more forceful and last longer than muscle spasms.  Muscle spasms may or may not be painful. They may also last just a few seconds or much longer.  CAUSES  It is uncommon for cramps or spasms to be due to a serious underlying problem. In many cases, the cause of cramps or spasms is unknown. Some common causes are:   Overexertion.   Overuse from repetitive motions (doing the same thing over and over).   Remaining in a certain position for a long period of time.   Improper preparation, form, or technique while performing a sport  or activity.   Dehydration.   Injury.   Side effects of some medicines.   Abnormally low levels of the salts and ions in your blood (electrolytes), especially potassium and calcium. This could happen if you are taking water pills (diuretics) or you are pregnant.  Some underlying medical problems can make it more likely to develop cramps or spasms. These include, but are not limited to:   Diabetes.   Parkinson disease.   Hormone disorders, such as thyroid problems.   Alcohol abuse.   Diseases specific to muscles, joints, and bones.   Blood vessel disease where not enough blood is getting to the muscles.  HOME CARE INSTRUCTIONS   Stay well hydrated. Drink enough water and fluids to keep your urine clear or pale yellow.  It may be helpful to massage, stretch, and relax the affected muscle.  For tight or tense muscles, use a warm towel, heating pad, or hot shower water directed to the affected area.  If you are sore or have pain after a cramp or spasm, applying ice to the affected area may relieve discomfort.  Put ice in a plastic bag.  Place a towel between your skin and the bag.  Leave the ice on for 15-20 minutes, 03-04 times a day.  Medicines used to treat a known cause of cramps or spasms may help reduce their frequency or severity. Only take over-the-counter or prescription medicines as directed by your caregiver. SEEK MEDICAL CARE IF:  Your cramps or spasms get more severe, more frequent, or do not improve over time.  MAKE SURE YOU:   Understand these instructions.  Will watch your condition.  Will get help right away if you are not doing well or get worse.   This information is not intended to replace advice given to you by your health care provider. Make sure you discuss any questions you have with your health care provider.   Document Released: 10/25/2001 Document Revised: 08/30/2012 Document Reviewed: 04/21/2012 Elsevier Interactive Patient  Education 2016 [employer].   Cryotherapy    Cryotherapy means treatment with cold. Ice or gel packs can be used to reduce both pain and swelling. Ice is the most helpful within the first 24 to 48 hours after an injury or flare-up from overusing a muscle or joint. Sprains, strains, spasms, burning pain, shooting pain, and aches can all be eased with ice. Ice can also be used when recovering from surgery. Ice is effective, has very few side effects, and is safe for most people to use.  PRECAUTIONS  Ice is not a safe treatment option for people with:  Raynaud phenomenon. This is a condition affecting small blood vessels in the extremities. Exposure to cold may cause your problems to return.  Cold hypersensitivity. There are many forms of cold hypersensitivity, including:  Cold urticaria. Red, itchy hives appear on the skin when the tissues begin to warm after being iced.  Cold erythema. This is a red, itchy rash caused by exposure to cold.  Cold hemoglobinuria. Red blood cells break down when the tissues begin to  warm after being iced. The hemoglobin that carry oxygen are passed into the urine because they cannot combine with blood proteins fast enough. Numbness or altered sensitivity in the area being iced. If you have any of the following conditions, do not use ice until you have discussed cryotherapy with your caregiver:  Heart conditions, such as arrhythmia, angina, or chronic heart disease.  High blood pressure.  Healing wounds or open skin in the area being iced.  Current infections.  Rheumatoid arthritis.  Poor circulation.  Diabetes. Ice slows the blood flow in the region it is applied. This is beneficial when trying to stop inflamed tissues from spreading irritating chemicals to surrounding tissues. However, if you expose your skin to cold temperatures for too long or without the proper protection, you can damage your skin or nerves. Watch for signs of skin damage due to cold.  HOME  CARE INSTRUCTIONS  Follow these tips to use ice and cold packs safely.  Place a dry or damp towel between the ice and skin. A damp towel will cool the skin more quickly, so you may need to shorten the time that the ice is used.  For a more rapid response, add gentle compression to the ice.  Ice for no more than 10 to 20 minutes at a time. The bonier the area you are icing, the less time it will take to get the benefits of ice.  Check your skin after 5 minutes to make sure there are no signs of a poor response to cold or skin damage.  Rest 20 minutes or more between uses.  Once your skin is numb, you can end your treatment. You can test numbness by very lightly touching your skin. The touch should be so light that you do not see the skin dimple from the pressure of your fingertip. When using ice, most people will feel these normal sensations in this order: cold, burning, aching, and numbness.  Do not use ice on someone who cannot communicate their responses to pain, such as small children or people with dementia. HOW TO MAKE AN ICE PACK  Ice packs are the most common way to use ice therapy. Other methods include ice massage, ice baths, and cryosprays. Muscle creams that cause a cold, tingly feeling do not offer the same benefits that ice offers and should not be used as a substitute unless recommended by your caregiver.  To make an ice pack, do one of the following:  Place crushed ice or a bag of frozen vegetables in a sealable plastic bag. Squeeze out the excess air. Place this bag inside another plastic bag. Slide the bag into a pillowcase or place a damp towel between your skin and the bag.  Mix 3 parts water with 1 part rubbing alcohol. Freeze the mixture in a sealable plastic bag. When you remove the mixture from the freezer, it will be slushy. Squeeze out the excess air. Place this bag inside another plastic bag. Slide the bag into a pillowcase or place a damp towel between your skin and the  bag. SEEK MEDICAL CARE IF:  You develop white spots on your skin. This may give the skin a blotchy (mottled) appearance.  Your skin turns blue or pale.  Your skin becomes waxy or hard.  Your swelling gets worse. MAKE SURE YOU:  Understand these instructions.  Will watch your condition.  Will get help right away if you are not doing well or get worse. This information is not intended to  replace advice given to you by your health care provider. Make sure you discuss any questions you have with your health care provider.  Document Released: 12/30/2010 Document Revised: 05/26/2014 Document Reviewed: 12/30/2010  Elsevier Interactive Patient Education [employer].

## 2015-06-28 NOTE — ED Notes (Signed)
Pt reports slipping and falling on hardwood floor, reports pain to sacral area.

## 2015-06-28 NOTE — ED Provider Notes (Signed)
[facility] Emergency Department Provider Note  ____________________________________________  Time seen: Approximately 11:01 AM  I have reviewed the triage vital signs and the nursing notes.   HISTORY  Chief Complaint Tailbone Pain    HPI Holly Forbes is a 65 y.o. female , NAD, in pain, presents to the emergency department after slipping and falling on a hardwood floor at home earlier this morning. Was stepping over a pillow over the floor and believes she stepped on the edge of it causing her fall. Denies any LOC, head injury or other trauma. Has pain and muscle spasm in the lower back since the fall. Denies any numbness, weakness, tingling. No saddle paresthesias. Notes she had the sensation of loss of breath at the time of fall but denies any shortness of breath or difficulty breathing at this time.   Past Medical History  Diagnosis Date  . Hypertension   . 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 Back pain     Left sciatica, low back pain  . GERD (gastroesophageal reflux disease)   . Vitamin D deficiency   . Abdominal pain     There are no active problems to display for this patient.   Past Surgical History  Procedure Laterality Date  . Cholecystectomy    . Abdominal hysterectomy    . Cyst removal neck    . Colonoscopy N/A 09/25/2014    Procedure: COLONOSCOPY;  Surgeon: Lucilla Lame, MD;  Location: [place];  Service: Gastroenterology;  Laterality: N/A;  cecum [id number]  . Esophagogastroduodenoscopy N/A 09/25/2014    Procedure: ESOPHAGOGASTRODUODENOSCOPY (EGD);  Surgeon: Lucilla Lame, MD;  Location: [place];  Service: Gastroenterology;  Laterality: N/A;  . Polypectomy  09/25/2014    Procedure: POLYPECTOMY INTESTINAL;  Surgeon: Lucilla Lame, MD;  Location: [place];  Service: Gastroenterology;;    Current Outpatient Rx  Name  Route  Sig  Dispense  Refill  . aspirin 81 MG tablet   Oral   Take 81 mg by mouth  daily. am         . cyclobenzaprine (FLEXERIL) 5 MG tablet   Oral   Take 1 tablet (5 mg total) by mouth 3 (three) times daily as needed for muscle spasms.   21 tablet   0   . HYDROcodone-acetaminophen (NORCO) 5-325 MG tablet   Oral   Take 1-2 tablets by mouth every 6 (six) hours as needed for severe pain.   20 tablet   0   . ibuprofen (ADVIL,MOTRIN) 400 MG tablet   Oral   Take 400 mg by mouth as needed for moderate pain. 2 times/day         . lisinopril (PRINIVIL,ZESTRIL) 20 MG tablet   Oral   Take 20 mg by mouth daily. am         . metFORMIN (GLUCOPHAGE) 500 MG tablet   Oral   Take 500 mg by mouth daily. Mid-day         . Multiple Vitamin (MULTIVITAMIN) tablet   Oral   Take 1 tablet by mouth daily. Doesn't take everyday         . pantoprazole (PROTONIX) 40 MG tablet   Oral   Take 1 tablet (40 mg total) by mouth daily.   30 tablet   11   . VITAMIN D, CHOLECALCIFEROL, PO   Oral   Take 1 Dose by mouth daily. Not on routine basis  Allergies Review of patient's allergies indicates no known allergies.  No family history on file.  Social History Social History  Substance Use Topics  . Smoking status: Former Smoker -- 0.25 packs/day for 15 years    Types: Cigarettes  . Smokeless tobacco: None  . Alcohol Use: Yes     Comment: 3-5 glasses wine/month     Review of Systems  Constitutional: No fever/chills, fatigue.  Eyes: No visual changes.  Cardiovascular: No chest pain. Respiratory: No shortness of breath. No wheezing.  Gastrointestinal: No abdominal pain.  No nausea, vomiting.   Genitourinary: Negative for dysuria. No hematuria. No urinary hesitancy, urgency or increased frequency. Musculoskeletal: Positive for back pain. No neck or shoulder pain.  Skin: Negative for rash, laceration, bruising. Neurological: Negative for headaches, focal weakness or numbness. No loss of bowel or bladder control.  10-point ROS otherwise  negative.  ____________________________________________   PHYSICAL EXAM:  VITAL SIGNS: ED Triage Vitals  Enc Vitals Group     BP 06/28/15 1044 160/80 mmHg     Pulse Rate 06/28/15 1044 80     Resp 06/28/15 1044 24     Temp 06/28/15 1044 98.9 F (37.2 C)     Temp Source 06/28/15 1044 Oral     SpO2 06/28/15 1044 100 %     Weight 06/28/15 1044 210 lb (95.255 kg)     Height 06/28/15 1044 5\' 7"  (1.702 m)     Head Cir --      Peak Flow --      Pain Score 06/28/15 1045 10     Pain Loc --      Pain Edu? --      Excl. in [place]? --     Constitutional: Alert and oriented. Well appearing and in no acute distress, laying on left side in pain. Eyes: Conjunctivae are normal. PERRL. Head: Atraumatic. Neck: Supple with full range of motion. No cervical spine tenderness to palpation. Hematological/Lymphatic/Immunilogical: No cervical lymphadenopathy. Cardiovascular: Normal rate, regular rhythm. Normal S1 and S2.  Good peripheral circulation. Respiratory: Normal respiratory effort without tachypnea or retractions. Lungs CTAB. Musculoskeletal: No pain to palpation about the cervical nor thoracic spine. Tenderness to palpation over the lower portion of the lumbar spine. Mild muscle spasm appreciated bilaterally about the lower lumbar spine. No pain about the sacral area. Patient has full range of motion of bilateral lower extremities. Gait is slow due to pain but steady. Neurologic:  Normal speech and language. No gross focal neurologic deficits are appreciated. Sensation intact to bilateral lower extremities. Skin:  Skin is warm, dry and intact. No rash noted. No erythema or ecchymosis noted. Psychiatric: Mood and affect are normal. Speech and behavior are normal. Patient exhibits appropriate insight and judgement.   ____________________________________________   LABS  None  ____________________________________________  EKG  None ____________________________________________  RADIOLOGY I  have personally viewed and evaluated these images (plain radiographs) as part of my medical decision making, as well as reviewing the written report by the radiologist.  Dg Lumbar Spine Complete  06/28/2015  CLINICAL DATA:  Fall this morning with low back pain, initial encounter EXAM: LUMBAR SPINE - COMPLETE 4+ VIEW COMPARISON:  None. FINDINGS: Five lumbar type vertebral bodies are well visualized. Lumbar vertebral body height is well maintained. Mild compression deformity at T12 is seen but appears chronic in nature. Correlation to point tenderness is recommended. No pars defects are seen. No spondylolisthesis is noted. IMPRESSION: Compression deformity at T12 which appears chronic in nature. Correlation to point tenderness  is recommended. Nonemergent MRI may be helpful for further evaluation. Electronically Signed   By: Inez Catalina M.D.   On: 06/28/2015 12:56   Dg Sacrum/coccyx  06/28/2015  CLINICAL DATA:  Pain following fall EXAM: SACRUM AND COCCYX - 2+ VIEW COMPARISON:  None. FINDINGS: Frontal and lateral views were obtained. There is an nondisplaced fracture in the mid coccyx. No other fracture evident. No diastases. The joint spaces appear intact. IMPRESSION: Nondisplaced fracture mid coccyx. Electronically Signed   By: Lowella Grip III M.D.   On: 06/28/2015 11:30    ____________________________________________    PROCEDURES  Procedure(s) performed: None     Medications  HYDROcodone-acetaminophen (NORCO/VICODIN) 5-325 MG per tablet 2 tablet (2 tablets Oral Given 06/28/15 1211)     ____________________________________________   INITIAL IMPRESSION / ASSESSMENT AND PLAN / ED COURSE  Pertinent imaging results that were available during my care of the patient were reviewed by me and considered in my medical decision making (see chart for details).  Patient's diagnosis is consistent with acute coccygeal fracture after fall. Patient visualized ambulating without weakness. Will be  discharged home with prescriptions for Norco to take one to 2 tablets every 4-6 hours as needed for severe pain. Advised that the patient discontinue this pain medication within 48 hours and start taking Tylenol 500 mg 1-2 tablets every 8 hours as needed for pain at that time. Advise against any NSAID medication as could delay healing per up-to-date. Chronic compression deformity at level T12 was noted on lumbar spinal x-rays but patient denies any tenderness to palpation in this area. She may follow with her primary care provider to evaluate such as needed. Patient is to follow up with Dr. Mack Guise with orthopedics if symptoms persist past this treatment course. Patient is given ED precautions to return to the ED for any worsening or new symptoms.    ____________________________________________  FINAL CLINICAL IMPRESSION(S) / ED DIAGNOSES  Final diagnoses:  Fracture of coccyx or sacrum, without spinal injury, closed, initial encounter  Muscle spasm of back      NEW MEDICATIONS STARTED DURING THIS VISIT:  New Prescriptions   CYCLOBENZAPRINE (FLEXERIL) 5 MG TABLET    Take 1 tablet (5 mg total) by mouth 3 (three) times daily as needed for muscle spasms.   HYDROCODONE-ACETAMINOPHEN (NORCO) 5-325 MG TABLET    Take 1-2 tablets by mouth every 6 (six) hours as needed for severe pain.         Braxton Feathers, PA-C 06/28/15 1318  Joanne Gavel, MD 06/28/15 [phone]

## 2017-06-19 ENCOUNTER — Encounter: Payer: Self-pay | Admitting: Nurse Practitioner

## 2017-06-19 ENCOUNTER — Ambulatory Visit: Payer: BLUE CROSS/BLUE SHIELD | Admitting: Nurse Practitioner

## 2017-06-19 VITALS — BP 140/80 | HR 89 | Resp 16 | Ht 67.0 in | Wt 219.0 lb

## 2017-06-19 DIAGNOSIS — R1013 Epigastric pain: Secondary | ICD-10-CM | POA: Diagnosis not present

## 2017-06-19 DIAGNOSIS — Z1211 Encounter for screening for malignant neoplasm of colon: Secondary | ICD-10-CM

## 2017-06-19 DIAGNOSIS — K279 Peptic ulcer, site unspecified, unspecified as acute or chronic, without hemorrhage or perforation: Secondary | ICD-10-CM | POA: Diagnosis not present

## 2017-06-19 DIAGNOSIS — E1165 Type 2 diabetes mellitus with hyperglycemia: Secondary | ICD-10-CM

## 2017-06-19 DIAGNOSIS — B9681 Helicobacter pylori [H. pylori] as the cause of diseases classified elsewhere: Secondary | ICD-10-CM

## 2017-06-19 LAB — POCT GLYCOSYLATED HEMOGLOBIN (HGB A1C): Hemoglobin A1C: 6.3

## 2017-06-19 NOTE — Progress Notes (Addendum)
[facility] [place], [place] [address]  Internal MEDICINE  Office Visit Note  Patient Name: Holly Forbes  [id number]  [id number]  Date of Service: 06/28/2017  Chief Complaint  Patient presents with  . Abdominal Pain    has history of H.pylori  . Diabetes    Abdominal Pain  This is a recurrent problem. The current episode started more than 1 month ago. The onset quality is gradual. The problem occurs constantly. The problem has been gradually worsening. The pain is located in the epigastric region and RUQ. The quality of the pain is colicky. The abdominal pain radiates to the RUQ. Associated symptoms include belching, constipation and flatus. Pertinent negatives include no arthralgias, diarrhea, dysuria, frequency, headaches, nausea or vomiting. The pain is aggravated by eating. The pain is relieved by nothing. She has tried proton pump inhibitors (GI detox) for the symptoms. The treatment provided no relief. Prior diagnostic workup includes upper endoscopy. Her past medical history is significant for abdominal surgery and gallstones.  Diabetes  She presents for her follow-up diabetic visit. She has type 2 diabetes mellitus. No MedicAlert identification noted. Her disease course has been improving. There are no hypoglycemic associated symptoms. Pertinent negatives for hypoglycemia include no headaches, nervousness/anxiousness or tremors. There are no diabetic associated symptoms. Pertinent negatives for diabetes include no chest pain and no fatigue. There are no hypoglycemic complications. Symptoms are improving. There are no diabetic complications. Risk factors for coronary artery disease include dyslipidemia and hypertension. When asked about current treatments, none (the patient has been able to discontinue all diabetic medication) were reported. She is compliant with treatment all of the time. Her weight is decreasing steadily. She is following a generally healthy  diet. Meal planning includes ADA exchanges. She has not had a previous visit with a dietitian. She participates in exercise daily. Her home blood glucose trend is decreasing steadily. An ACE inhibitor/angiotensin II receptor blocker is being taken. She does not see a podiatrist.Eye exam is not current.    Pt is here for routine follow up.    Current Medication: Outpatient Encounter Medications as of 06/19/2017  Medication Sig  . aspirin 81 MG tablet Take 81 mg by mouth daily. am  . cyanocobalamin 1000 MCG tablet Take 1,000 mcg by mouth daily.  Marland Kitchen ibuprofen (ADVIL,MOTRIN) 400 MG tablet Take 400 mg by mouth as needed for moderate pain. 2 times/day  . lisinopril (PRINIVIL,ZESTRIL) 20 MG tablet Take 20 mg by mouth daily. am  . Multiple Vitamin (MULTIVITAMIN) tablet Take 1 tablet by mouth daily. Doesn't take everyday  . pantoprazole (PROTONIX) 40 MG tablet Take 1 tablet (40 mg total) by mouth daily.  Marland Kitchen VITAMIN D, CHOLECALCIFEROL, PO Take 1 Dose by mouth daily. Not on routine basis  . [DISCONTINUED] HYDROcodone-acetaminophen (NORCO) 5-325 MG tablet Take 1-2 tablets by mouth every 6 (six) hours as needed for severe pain. (Patient not taking: Reported on 06/19/2017)  . [DISCONTINUED] metFORMIN (GLUCOPHAGE) 500 MG tablet Take 500 mg by mouth daily. Mid-day   Facility-Administered Encounter Medications as of 06/19/2017  Medication  . triamcinolone acetonide (KENALOG) 10 MG/ML injection 10 mg    Surgical History: Past Surgical History:  Procedure Laterality Date  . ABDOMINAL HYSTERECTOMY    . CHOLECYSTECTOMY    . COLONOSCOPY N/A 09/25/2014   Procedure: COLONOSCOPY;  Surgeon: Lucilla Lame, MD;  Location: [place];  Service: Gastroenterology;  Laterality: N/A;  cecum time- 2637  . CYST REMOVAL NECK    . ESOPHAGOGASTRODUODENOSCOPY N/A 09/25/2014  Procedure: ESOPHAGOGASTRODUODENOSCOPY (EGD);  Surgeon: Lucilla Lame, MD;  Location: [place];  Service: Gastroenterology;  Laterality: N/A;  .  POLYPECTOMY  09/25/2014   Procedure: POLYPECTOMY INTESTINAL;  Surgeon: Lucilla Lame, MD;  Location: [place];  Service: Gastroenterology;;    Medical History: Past Medical History:  Diagnosis Date  . Abdominal pain   . 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 Back pain    Left sciatica, low back pain  . GERD (gastroesophageal reflux disease)   . Hypertension   . Vitamin D deficiency     Family History: Family History  Problem Relation Age of Onset  . Heart disease Sister   . Diabetes Sister   . Hypertension Sister   . Heart disease Brother   . Diabetes Brother   . Hypertension Brother     Social History   Socioeconomic History  . Marital status: Married    Spouse name: Not on file  . Number of children: Not on file  . Years of education: Not on file  . Highest education level: Not on file  Social Needs  . Financial resource strain: Not on file  . Food insecurity - worry: Not on file  . Food insecurity - inability: Not on file  . Transportation needs - medical: Not on file  . Transportation needs - non-medical: Not on file  Occupational History  . Not on file  Tobacco Use  . Smoking status: Former Smoker    Packs/day: 0.25    Years: 15.00    Pack years: 3.75    Types: Cigarettes  Substance and Sexual Activity  . Alcohol use: Yes    Comment: 3-5 glasses wine/month  . Drug use: Not on file  . Sexual activity: Not on file  Other Topics Concern  . Not on file  Social History Narrative  . Not on file      Review of Systems  Constitutional: Positive for activity change. Negative for chills, fatigue and unexpected weight change.  HENT: Negative for congestion, postnasal drip, rhinorrhea, sneezing and sore throat.   Eyes: Negative for redness.  Respiratory: Negative for cough, chest tightness and shortness of breath.   Cardiovascular: Negative for chest pain and palpitations.  Gastrointestinal: Positive for abdominal distention, abdominal pain,  constipation and flatus. Negative for diarrhea, nausea and vomiting.       Epigastric pain, mostly after eating.   Endocrine:       Blood sugars doing well   Genitourinary: Negative for dysuria and frequency.  Musculoskeletal: Negative for arthralgias, back pain, joint swelling and neck pain.  Skin: Negative for rash.  Allergic/Immunologic: Negative for environmental allergies, food allergies and immunocompromised state.  Neurological: Negative for tremors, light-headedness, numbness and headaches.  Hematological: Negative for adenopathy. Does not bruise/bleed easily.  Psychiatric/Behavioral: Negative for behavioral problems (Depression), sleep disturbance and suicidal ideas. The patient is not nervous/anxious.     Today's Vitals   06/19/17 1032  BP: 140/80  Pulse: 89  Resp: 16  SpO2: 98%  Weight: 219 lb (99.3 kg)  Height: 5\' 7"  (1.702 m)    Physical Exam  Constitutional: She is oriented to person, place, and time. She appears well-developed and well-nourished. No distress.  HENT:  Head: Normocephalic and atraumatic.  Mouth/Throat: Oropharynx is clear and moist. No oropharyngeal exudate.  Eyes: EOM are normal. Pupils are equal, round, and reactive to light.  Neck: Normal range of motion. Neck supple. No JVD present. Carotid bruit is not present. No  tracheal deviation present. No thyromegaly present.  Cardiovascular: Normal rate, regular rhythm and normal heart sounds. Exam reveals no gallop and no friction rub.  No murmur heard. Pulmonary/Chest: Effort normal and breath sounds normal. No respiratory distress. She has no wheezes. She has no rales. She exhibits no tenderness.  Abdominal: Soft. Bowel sounds are normal. There is hepatosplenomegaly. There is tenderness in the epigastric area. There is no CVA tenderness, no tenderness at McBurney's point and negative Murphy's sign.  Musculoskeletal: Normal range of motion.  Lymphadenopathy:    She has no cervical adenopathy.   Neurological: She is alert and oriented to person, place, and time. No cranial nerve deficit.  Skin: Skin is warm and dry. She is not diaphoretic.  Psychiatric: She has a normal mood and affect. Her behavior is normal. Judgment and thought content normal.  Nursing note and vitals reviewed.   Assessment/Plan: 1. Type 2 diabetes mellitus with hyperglycemia, unspecified whether long term insulin use (HCC) - POCT HgB A1C  6.3 today. Currently not on meds for diabetes. Controlling with diett and exercise alone.  - Comprehensive metabolic panel  2. Colicky epigastric pain protonix not helping GERD.  - DG UGI  W/KUB; Future - CBC w/Diff/Platelet - Comprehensive metabolic panel - H Pylori, IGM, IGG, IGA AB  3. Peptic ulcer due to Helicobacter pylori - DG UGI  W/KUB; Future - CBC w/Diff/Platelet - H Pylori, IGM, IGG, IGA AB  4. Screening for colon cancer - Ambulatory referral to Gastroenterology   General Counseling: maila dukes understanding of the findings of todays visit and agrees with plan of treatment. I have discussed any further diagnostic evaluation that may be needed or ordered today. We also reviewed her medications today. she has been encouraged to call the office with any questions or concerns that should arise related to todays visit.    This patient was seen by Leretha Pol, FNP- C in Collaboration with Dr Lavera Guise as a part of collaborative care agreement  Orders Placed This Encounter  Procedures  . DG UGI  W/KUB  . CBC w/Diff/Platelet  . Comprehensive metabolic panel  . H Pylori, IGM, IGG, IGA AB  . Ambulatory referral to Gastroenterology  . POCT HgB A1C     Time spent: 20 Minutes      Dr Lavera Guise Internal medicine

## 2017-06-28 ENCOUNTER — Encounter: Payer: Self-pay | Admitting: Nurse Practitioner

## 2017-06-30 ENCOUNTER — Ambulatory Visit
Admission: RE | Admit: 2017-06-30 | Discharge: 2017-06-30 | Disposition: A | Discharge status: Home / Self Care | Payer: BLUE CROSS/BLUE SHIELD | Source: Ambulatory Visit | Attending: Nurse Practitioner | Admitting: Nurse Practitioner

## 2017-06-30 DIAGNOSIS — K279 Peptic ulcer, site unspecified, unspecified as acute or chronic, without hemorrhage or perforation: Secondary | ICD-10-CM | POA: Insufficient documentation

## 2017-06-30 DIAGNOSIS — R1013 Epigastric pain: Secondary | ICD-10-CM | POA: Diagnosis not present

## 2017-06-30 DIAGNOSIS — B9681 Helicobacter pylori [H. pylori] as the cause of diseases classified elsewhere: Secondary | ICD-10-CM | POA: Insufficient documentation

## 2017-06-30 DIAGNOSIS — K219 Gastro-esophageal reflux disease without esophagitis: Secondary | ICD-10-CM | POA: Insufficient documentation

## 2017-06-30 DIAGNOSIS — K449 Diaphragmatic hernia without obstruction or gangrene: Secondary | ICD-10-CM | POA: Diagnosis not present

## 2017-08-13 ENCOUNTER — Other Ambulatory Visit: Payer: Self-pay | Admitting: Internal Medicine

## 2017-08-13 DIAGNOSIS — K2100 Gastro-esophageal reflux disease with esophagitis, without bleeding: Secondary | ICD-10-CM

## 2017-09-15 ENCOUNTER — Other Ambulatory Visit: Payer: Self-pay | Admitting: Internal Medicine

## 2017-09-17 ENCOUNTER — Other Ambulatory Visit: Payer: Self-pay

## 2017-09-17 MED ORDER — LISINOPRIL 20 MG PO TABS: 20.0000 mg | ORAL_TABLET | Freq: Every day | ORAL | 6 refills | Status: DC

## 2017-09-24 ENCOUNTER — Other Ambulatory Visit: Payer: Self-pay

## 2017-09-24 DIAGNOSIS — K2100 Gastro-esophageal reflux disease with esophagitis, without bleeding: Secondary | ICD-10-CM

## 2017-09-24 MED ORDER — PANTOPRAZOLE SODIUM 40 MG PO TBEC
40.0000 mg | DELAYED_RELEASE_TABLET | Freq: Every day | ORAL | 5 refills | Status: DC
Start: 2017-09-24 — End: 2018-03-26

## 2017-10-19 ENCOUNTER — Ambulatory Visit: Payer: BLUE CROSS/BLUE SHIELD | Admitting: Nurse Practitioner

## 2017-10-19 ENCOUNTER — Encounter: Payer: Self-pay | Admitting: Nurse Practitioner

## 2017-10-19 VITALS — BP 140/72 | HR 69 | Resp 16 | Ht 67.0 in | Wt 231.4 lb

## 2017-10-19 DIAGNOSIS — K219 Gastro-esophageal reflux disease without esophagitis: Secondary | ICD-10-CM | POA: Diagnosis not present

## 2017-10-19 DIAGNOSIS — E1165 Type 2 diabetes mellitus with hyperglycemia: Secondary | ICD-10-CM

## 2017-10-19 DIAGNOSIS — I10 Essential (primary) hypertension: Secondary | ICD-10-CM | POA: Diagnosis not present

## 2017-10-19 LAB — POCT GLYCOSYLATED HEMOGLOBIN (HGB A1C): Hemoglobin A1C: 6.7 % — AB (ref 4.0–5.6)

## 2017-10-19 NOTE — Progress Notes (Signed)
[facility] [place], [place] [address]  Internal MEDICINE  Office Visit Note  Patient Name: Holly Forbes  [id number]  [id number]  Date of Service: 10/19/2017   Pt is here for routine follow up.   Chief Complaint  Patient presents with  . Diabetes    The patient has seen GI since her last visit. Recommend she take pantoprazole twice daily, however, she continues to take it once daily. Will have endoscopy after July, once her new insurance kicks in. Doing well with pantoprazole as prescribed   Diabetes  She presents for her follow-up diabetic visit. She has type 2 diabetes mellitus. Her disease course has been stable. There are no hypoglycemic associated symptoms. Pertinent negatives for hypoglycemia include no headaches, nervousness/anxiousness or tremors. Pertinent negatives for diabetes include no chest pain and no fatigue. There are no hypoglycemic complications. Symptoms are stable. There are no diabetic complications. Risk factors for coronary artery disease include hypertension. Current diabetic treatment includes diet. She is compliant with treatment all of the time. Her weight is stable. She is following a generally healthy diet. Meal planning includes avoidance of concentrated sweets. She has not had a previous visit with a dietitian. She participates in exercise intermittently. There is no change in her home blood glucose trend. An ACE inhibitor/angiotensin II receptor blocker is being taken. She does not see a podiatrist.Eye exam is current.       Current Medication: Outpatient Encounter Medications as of 10/19/2017  Medication Sig  . aspirin 81 MG tablet Take 81 mg by mouth daily. am  . cyanocobalamin 1000 MCG tablet Take 1,000 mcg by mouth daily.  Marland Kitchen ibuprofen (ADVIL,MOTRIN) 400 MG tablet TAKE 1 TABLET BY MOUTH TWICE DAILY WITH FOOD  . lisinopril (PRINIVIL,ZESTRIL) 20 MG tablet Take 1 tablet (20 mg total) by mouth daily. am  . Multiple Vitamin  (MULTIVITAMIN) tablet Take 1 tablet by mouth daily. Doesn't take everyday  . pantoprazole (PROTONIX) 40 MG tablet Take 1 tablet (40 mg total) by mouth daily.  Marland Kitchen VITAMIN D, CHOLECALCIFEROL, PO Take 1 Dose by mouth daily. Not on routine basis   Facility-Administered Encounter Medications as of 10/19/2017  Medication  . triamcinolone acetonide (KENALOG) 10 MG/ML injection 10 mg    Surgical History: Past Surgical History:  Procedure Laterality Date  . ABDOMINAL HYSTERECTOMY    . CHOLECYSTECTOMY    . COLONOSCOPY N/A 09/25/2014   Procedure: COLONOSCOPY;  Surgeon: Lucilla Lame, MD;  Location: [place];  Service: Gastroenterology;  Laterality: N/A;  cecum time- 7564  . CYST REMOVAL NECK    . ESOPHAGOGASTRODUODENOSCOPY N/A 09/25/2014   Procedure: ESOPHAGOGASTRODUODENOSCOPY (EGD);  Surgeon: Lucilla Lame, MD;  Location: [place];  Service: Gastroenterology;  Laterality: N/A;  . POLYPECTOMY  09/25/2014   Procedure: POLYPECTOMY INTESTINAL;  Surgeon: Lucilla Lame, MD;  Location: [place];  Service: Gastroenterology;;    Medical History: Past Medical History:  Diagnosis Date  . Abdominal pain   . 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 Back pain    Left sciatica, low back pain  . GERD (gastroesophageal reflux disease)   . Hypertension   . Vitamin D deficiency     Family History: Family History  Problem Relation Age of Onset  . Heart disease Sister   . Diabetes Sister   . Hypertension Sister   . Heart disease Brother   . Diabetes Brother   . Hypertension Brother     Social History   Socioeconomic  History  . Marital status: Married    Spouse name: Not on file  . Number of children: Not on file  . Years of education: Not on file  . Highest education level: Not on file  Occupational History  . Not on file  Social Needs  . Financial resource strain: Not on file  . Food insecurity:    Worry: Not on file    Inability: Not on file  . Transportation  needs:    Medical: Not on file    Non-medical: Not on file  Tobacco Use  . Smoking status: Former Smoker    Packs/day: 0.25    Years: 15.00    Pack years: 3.75    Types: Cigarettes  . Smokeless tobacco: Never Used  Substance and Sexual Activity  . Alcohol use: Yes    Comment: 3-5 glasses wine/month  . Drug use: Not on file  . Sexual activity: Not on file  Lifestyle  . Physical activity:    Days per week: Not on file    Minutes per session: Not on file  . Stress: Not on file  Relationships  . Social connections:    Talks on phone: Not on file    Gets together: Not on file    Attends religious service: Not on file    Active member of club or organization: Not on file    Attends meetings of clubs or organizations: Not on file    Relationship status: Not on file  . Intimate partner violence:    Fear of current or ex partner: Not on file    Emotionally abused: Not on file    Physically abused: Not on file    Forced sexual activity: Not on file  Other Topics Concern  . Not on file  Social History Narrative  . Not on file      Review of Systems  Constitutional: Positive for activity change. Negative for chills, fatigue and unexpected weight change.  HENT: Negative for congestion, postnasal drip, rhinorrhea, sneezing and sore throat.   Eyes: Negative for redness.  Respiratory: Negative for cough, chest tightness and shortness of breath.   Cardiovascular: Negative for chest pain and palpitations.  Gastrointestinal: Positive for abdominal pain and constipation. Negative for abdominal distention, diarrhea, nausea and vomiting.       GERD symptoms improved .  Endocrine:       Blood sugars doing well   Genitourinary: Negative for dysuria and frequency.  Musculoskeletal: Negative for arthralgias, back pain, joint swelling and neck pain.  Skin: Negative for rash.  Allergic/Immunologic: Positive for environmental allergies. Negative for food allergies and immunocompromised  state.  Neurological: Negative for tremors, light-headedness, numbness and headaches.  Hematological: Negative for adenopathy. Does not bruise/bleed easily.  Psychiatric/Behavioral: Negative for behavioral problems (Depression), sleep disturbance and suicidal ideas. The patient is not nervous/anxious.    Today's Vitals   10/19/17 0952  BP: 140/72  Pulse: 69  Resp: 16  SpO2: 96%  Weight: 231 lb 6.4 oz (105 kg)  Height: 5\' 7"  (1.702 m)    Physical Exam  Constitutional: She is oriented to person, place, and time. She appears well-developed and well-nourished. No distress.  HENT:  Head: Normocephalic and atraumatic.  Mouth/Throat: Oropharynx is clear and moist. No oropharyngeal exudate.  Eyes: Pupils are equal, round, and reactive to light. Conjunctivae and EOM are normal.  Neck: Normal range of motion. Neck supple. No JVD present. Carotid bruit is not present. No tracheal deviation present. No thyromegaly present.  Cardiovascular: Normal rate, regular rhythm and normal heart sounds. Exam reveals no gallop and no friction rub.  No murmur heard. Pulmonary/Chest: Effort normal and breath sounds normal. No respiratory distress. She has no wheezes. She has no rales. She exhibits no tenderness.  Abdominal: Soft. Bowel sounds are normal. There is hepatosplenomegaly. There is no tenderness. There is no CVA tenderness, no tenderness at McBurney's point and negative Murphy's sign.  Musculoskeletal: Normal range of motion.  Lymphadenopathy:    She has no cervical adenopathy.  Neurological: She is alert and oriented to person, place, and time. No cranial nerve deficit.  Skin: Skin is warm and dry. She is not diaphoretic.  Psychiatric: She has a normal mood and affect. Her behavior is normal. Judgment and thought content normal.  Nursing note and vitals reviewed.  Assessment/Plan: 1. Uncontrolled type 2 diabetes mellitus with hyperglycemia (HCC) - POCT HgB A1C 6.7 today. Continue to control  through diet and exercise. Will refer for diabetic eye exam at her next visit.   2. Gastroesophageal reflux disease without esophagitis Improved. Continue pantoprazole as prescribed.   3. Essential hypertension Stable. Continue bp medication as prescribed.   General Counseling: lalana wachter understanding of the findings of todays visit and agrees with plan of treatment. I have discussed any further diagnostic evaluation that may be needed or ordered today. We also reviewed her medications today. she has been encouraged to call the office with any questions or concerns that should arise related to todays visit.  Diabetes Counseling:  1. Addition of ACE inh/ ARB'S for nephroprotection. 2. Diabetic foot care, prevention of complications.  3.Exercise and lose weight.  4. Diabetic eye examination, 5. Monitor blood sugar closlely. nutrition counseling.  6.Sign and symptoms of hypoglycemia including shaking sweating,confusion and headaches.   This patient was seen by Leretha Pol, FNP- C in Collaboration with Dr Lavera Guise as a part of collaborative care agreement    Orders Placed This Encounter  Procedures  . POCT HgB A1C     Time spent: 20 Minutes          Dr Lavera Guise Internal medicine

## 2017-10-19 NOTE — Patient Instructions (Signed)

## 2018-01-21 ENCOUNTER — Other Ambulatory Visit: Payer: Self-pay | Admitting: Nurse Practitioner

## 2018-01-21 DIAGNOSIS — E559 Vitamin D deficiency, unspecified: Secondary | ICD-10-CM | POA: Diagnosis not present

## 2018-01-21 DIAGNOSIS — I10 Essential (primary) hypertension: Secondary | ICD-10-CM | POA: Diagnosis not present

## 2018-01-21 DIAGNOSIS — E1165 Type 2 diabetes mellitus with hyperglycemia: Secondary | ICD-10-CM | POA: Diagnosis not present

## 2018-01-21 DIAGNOSIS — Z0000 Encounter for general adult medical examination without abnormal findings: Secondary | ICD-10-CM | POA: Diagnosis not present

## 2018-01-22 ENCOUNTER — Ambulatory Visit (INDEPENDENT_AMBULATORY_CARE_PROVIDER_SITE_OTHER): Payer: PPO | Admitting: Nurse Practitioner

## 2018-01-22 ENCOUNTER — Encounter: Payer: Self-pay | Admitting: Nurse Practitioner

## 2018-01-22 VITALS — BP 185/99 | HR 65 | Resp 16 | Ht 67.0 in | Wt 226.6 lb

## 2018-01-22 DIAGNOSIS — E1165 Type 2 diabetes mellitus with hyperglycemia: Secondary | ICD-10-CM | POA: Diagnosis not present

## 2018-01-22 DIAGNOSIS — E2839 Other primary ovarian failure: Secondary | ICD-10-CM | POA: Diagnosis not present

## 2018-01-22 DIAGNOSIS — Z23 Encounter for immunization: Secondary | ICD-10-CM

## 2018-01-22 DIAGNOSIS — Z1239 Encounter for other screening for malignant neoplasm of breast: Secondary | ICD-10-CM

## 2018-01-22 DIAGNOSIS — R300 Dysuria: Secondary | ICD-10-CM | POA: Diagnosis not present

## 2018-01-22 DIAGNOSIS — I10 Essential (primary) hypertension: Secondary | ICD-10-CM | POA: Diagnosis not present

## 2018-01-22 DIAGNOSIS — J019 Acute sinusitis, unspecified: Secondary | ICD-10-CM | POA: Diagnosis not present

## 2018-01-22 DIAGNOSIS — Z0001 Encounter for general adult medical examination with abnormal findings: Secondary | ICD-10-CM | POA: Diagnosis not present

## 2018-01-22 DIAGNOSIS — Z1231 Encounter for screening mammogram for malignant neoplasm of breast: Secondary | ICD-10-CM | POA: Diagnosis not present

## 2018-01-22 LAB — CBC
Hematocrit: 39 % (ref 34.0–46.6)
Hemoglobin: 12.5 g/dL (ref 11.1–15.9)
MCH: 25.7 pg — ABNORMAL LOW (ref 26.6–33.0)
MCHC: 32.1 g/dL (ref 31.5–35.7)
MCV: 80 fL (ref 79–97)
Platelets: 317 10*3/uL (ref 150–450)
RBC: 4.87 x10E6/uL (ref 3.77–5.28)
RDW: 15.4 % (ref 12.3–15.4)
WBC: 8 10*3/uL (ref 3.4–10.8)

## 2018-01-22 LAB — LIPID PANEL W/O CHOL/HDL RATIO
Cholesterol, Total: 213 mg/dL — ABNORMAL HIGH (ref 100–199)
HDL: 56 mg/dL (ref >39)
LDL Calculated: 142 mg/dL — ABNORMAL HIGH (ref 0–99)
Triglycerides: 76 mg/dL (ref 0–149)
VLDL Cholesterol Cal: 15 mg/dL (ref 5–40)

## 2018-01-22 LAB — COMPREHENSIVE METABOLIC PANEL
ALT: 16 IU/L (ref 0–32)
AST: 13 IU/L (ref 0–40)
Albumin/Globulin Ratio: 1.6 (ref 1.2–2.2)
Albumin: 4.1 g/dL (ref 3.6–4.8)
Alkaline Phosphatase: 105 IU/L (ref 39–117)
BUN/Creatinine Ratio: 8 — ABNORMAL LOW (ref 12–28)
BUN: 6 mg/dL — ABNORMAL LOW (ref 8–27)
Bilirubin Total: 0.5 mg/dL (ref 0.0–1.2)
CO2: 24 mmol/L (ref 20–29)
Calcium: 9.2 mg/dL (ref 8.7–10.3)
Chloride: 104 mmol/L (ref 96–106)
Creatinine, Ser: 0.74 mg/dL (ref 0.57–1.00)
GFR calc Af Amer: 98 mL/min/{1.73_m2} (ref >59)
GFR calc non Af Amer: 85 mL/min/{1.73_m2} (ref >59)
Globulin, Total: 2.6 g/dL (ref 1.5–4.5)
Glucose: 123 mg/dL — ABNORMAL HIGH (ref 65–99)
Potassium: 4.4 mmol/L (ref 3.5–5.2)
Sodium: 142 mmol/L (ref 134–144)
Total Protein: 6.7 g/dL (ref 6.0–8.5)

## 2018-01-22 LAB — MICROALBUMIN / CREATININE URINE RATIO
Creatinine, Urine: 106.8 mg/dL
Microalb/Creat Ratio: 5.1 mg/g creat (ref 0.0–30.0)
Microalbumin, Urine: 5.5 ug/mL

## 2018-01-22 LAB — POCT GLYCOSYLATED HEMOGLOBIN (HGB A1C): Hemoglobin A1C: 6.5 % — AB (ref 4.0–5.6)

## 2018-01-22 LAB — VITAMIN D 25 HYDROXY (VIT D DEFICIENCY, FRACTURES): Vit D, 25-Hydroxy: 52.6 ng/mL (ref 30.0–100.0)

## 2018-01-22 LAB — TSH: TSH: 1.76 u[IU]/mL (ref 0.450–4.500)

## 2018-01-22 LAB — T4, FREE: Free T4: 1.19 ng/dL (ref 0.82–1.77)

## 2018-01-22 MED ORDER — AMOXICILLIN 875 MG PO TABS: 875.0000 mg | ORAL_TABLET | Freq: Two times a day (BID) | ORAL | 0 refills | Status: DC

## 2018-01-22 MED ORDER — HYDROCHLOROTHIAZIDE 12.5 MG PO TABS: 12.5000 mg | ORAL_TABLET | Freq: Every day | ORAL | 3 refills | Status: DC

## 2018-01-22 NOTE — Progress Notes (Signed)
[facility] [place], [place] [address]  Internal MEDICINE  Office Visit Note  Patient Name: Holly Forbes  [id number]  [id number]  Date of Service: 01/28/2018   Pt is here for routine health maintenance examination   Chief Complaint  Patient presents with  . Annual Exam    3 month cpe  . Diabetes  . Sinusitis    pt has a feeling that she have some sinus pressure/headasche going on this morning  . Hypertension     Diabetes  She presents for her follow-up diabetic visit. She has type 2 diabetes mellitus. No MedicAlert identification noted. Her disease course has been stable. Hypoglycemia symptoms include dizziness and headaches. Pertinent negatives for hypoglycemia include no nervousness/anxiousness. Associated symptoms include fatigue. There are no hypoglycemic complications. Symptoms are stable. There are no diabetic complications. Risk factors for coronary artery disease include diabetes mellitus, hypertension, obesity, post-menopausal and stress. Current diabetic treatment includes oral agent (monotherapy). She is compliant with treatment all of the time. Her weight is stable. She is following a generally healthy diet. Meal planning includes avoidance of concentrated sweets. She has not had a previous visit with a dietitian. She participates in exercise weekly. There is no change in her home blood glucose trend. An ACE inhibitor/angiotensin II receptor blocker is being taken. She does not see a podiatrist.Eye exam is not current.  Sinusitis  This is a new problem. The current episode started yesterday. The problem is unchanged. There has been no fever. The fever has been present for less than 1 day. Associated symptoms include congestion, coughing, ear pain, headaches, shortness of breath and sinus pressure. Past treatments include nothing.  Hypertension  This is a chronic problem. The current episode started more than 1 year ago. The problem has been  gradually worsening since onset. The problem is uncontrolled. Associated symptoms include headaches, orthopnea, peripheral edema and shortness of breath. There are no associated agents to hypertension. Risk factors for coronary artery disease include dyslipidemia, diabetes mellitus, obesity, post-menopausal state, sedentary lifestyle and stress. Past treatments include ACE inhibitors. The current treatment provides moderate improvement. Compliance problems include exercise.      Current Medication: Outpatient Encounter Medications as of 01/22/2018  Medication Sig  . aspirin 81 MG tablet Take 81 mg by mouth daily. am  . cyanocobalamin 1000 MCG tablet Take 1,000 mcg by mouth daily.  Marland Kitchen ibuprofen (ADVIL,MOTRIN) 400 MG tablet TAKE 1 TABLET BY MOUTH TWICE DAILY WITH FOOD  . lisinopril (PRINIVIL,ZESTRIL) 20 MG tablet Take 1 tablet (20 mg total) by mouth daily. am  . Multiple Vitamin (MULTIVITAMIN) tablet Take 1 tablet by mouth daily. Doesn't take everyday  . pantoprazole (PROTONIX) 40 MG tablet Take 1 tablet (40 mg total) by mouth daily.  Marland Kitchen VITAMIN D, CHOLECALCIFEROL, PO Take 1 Dose by mouth daily. Not on routine basis  . amoxicillin (AMOXIL) 875 MG tablet Take 1 tablet (875 mg total) by mouth 2 (two) times daily.  . hydrochlorothiazide (HYDRODIURIL) 12.5 MG tablet Take 1 tablet (12.5 mg total) by mouth daily.   Facility-Administered Encounter Medications as of 01/22/2018  Medication  . triamcinolone acetonide (KENALOG) 10 MG/ML injection 10 mg    Surgical History: Past Surgical History:  Procedure Laterality Date  . ABDOMINAL HYSTERECTOMY    . CHOLECYSTECTOMY    . COLONOSCOPY N/A 09/25/2014   Procedure: COLONOSCOPY;  Surgeon: Lucilla Lame, MD;  Location: [place];  Service: Gastroenterology;  Laterality: N/A;  cecum time- 8413  . CYST REMOVAL NECK    .  ESOPHAGOGASTRODUODENOSCOPY N/A 09/25/2014   Procedure: ESOPHAGOGASTRODUODENOSCOPY (EGD);  Surgeon: Lucilla Lame, MD;  Location: [place];  Service: Gastroenterology;  Laterality: N/A;  . POLYPECTOMY  09/25/2014   Procedure: POLYPECTOMY INTESTINAL;  Surgeon: Lucilla Lame, MD;  Location: [facility];  Service: Gastroenterology;;    Medical History: Past Medical History:  Diagnosis Date  . Abdominal pain   . 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 Back pain    Left sciatica, low back pain  . GERD (gastroesophageal reflux disease)   . Hypertension   . Vitamin D deficiency     Family History: Family History  Problem Relation Age of Onset  . Heart disease Sister   . Diabetes Sister   . Hypertension Sister   . Heart disease Brother   . Diabetes Brother   . Hypertension Brother       Review of Systems  Constitutional: Positive for fatigue.  HENT: Positive for congestion, ear pain, postnasal drip and sinus pressure.   Eyes: Negative.   Respiratory: Positive for cough and shortness of breath.   Cardiovascular: Positive for orthopnea.       Bp very elevated today.   Gastrointestinal: Positive for abdominal distention, abdominal pain and nausea. Negative for constipation and vomiting.  Endocrine:       Blood sugars doing well   Genitourinary: Negative.   Musculoskeletal: Negative for arthralgias, back pain and myalgias.  Skin: Negative for rash.  Allergic/Immunologic: Positive for environmental allergies.  Neurological: Positive for dizziness and headaches.  Hematological: Negative for adenopathy.  Psychiatric/Behavioral: Negative for behavioral problems and dysphoric mood. The patient is not nervous/anxious.      Today's Vitals   01/22/18 0921  BP: (!) 185/99  Pulse: 65  Resp: 16  SpO2: 98%  Weight: 226 lb 9.6 oz (102.8 kg)  Height: 5\' 7"  (1.702 m)    Physical Exam  Constitutional: She is oriented to person, place, and time. She appears well-developed and well-nourished. No distress.  HENT:  Head: Normocephalic and atraumatic.  Mouth/Throat: Oropharynx is clear and moist. No  oropharyngeal exudate.  Eyes: Pupils are equal, round, and reactive to light. Conjunctivae and EOM are normal.  Neck: Normal range of motion. Neck supple. No JVD present. Carotid bruit is not present. No tracheal deviation present. No thyromegaly present.  Cardiovascular: Normal rate, regular rhythm and normal heart sounds. Exam reveals no gallop and no friction rub.  No murmur heard. Pulses:      Dorsalis pedis pulses are 2+ on the left side.       Posterior tibial pulses are 2+ on the right side, and 2+ on the left side.  Pulmonary/Chest: Effort normal and breath sounds normal. No respiratory distress. She has no wheezes. She has no rales. She exhibits no tenderness.  Abdominal: Soft. Bowel sounds are normal. There is hepatosplenomegaly. There is no tenderness. There is no CVA tenderness, no tenderness at McBurney's point and negative Murphy's sign.  Musculoskeletal: Normal range of motion.       Right foot: There is normal range of motion and no deformity.       Left foot: There is normal range of motion and no deformity.  Feet:  Right Foot:  Protective Sensation: 10 sites tested. 10 sites sensed.  Left Foot:  Protective Sensation: 10 sites tested. 10 sites sensed.  Lymphadenopathy:    She has no cervical adenopathy.  Neurological: She is alert and oriented to person, place, and time. No cranial nerve deficit.  Skin: Skin is warm and dry. She is not diaphoretic.  Psychiatric: She has a normal mood and affect. Her behavior is normal. Judgment and thought content normal.  Nursing note and vitals reviewed.    LABS: Recent Results (from the past 2160 hour(s))  Comprehensive metabolic panel     Status: Abnormal   Collection Time: 01/21/18  9:08 AM  Result Value Ref Range   Glucose 123 (H) 65 - 99 mg/dL   BUN 6 (L) 8 - 27 mg/dL   Creatinine, Ser 0.74 0.57 - 1.00 mg/dL   GFR calc non Af Amer 85 >59 mL/min/1.73   GFR calc Af Amer 98 >59 mL/min/1.73   BUN/Creatinine Ratio 8 (L) 12 - 28    Sodium 142 134 - 144 mmol/L   Potassium 4.4 3.5 - 5.2 mmol/L   Chloride 104 96 - 106 mmol/L   CO2 24 20 - 29 mmol/L   Calcium 9.2 8.7 - 10.3 mg/dL   Total Protein 6.7 6.0 - 8.5 g/dL   Albumin 4.1 3.6 - 4.8 g/dL   Globulin, Total 2.6 1.5 - 4.5 g/dL   Albumin/Globulin Ratio 1.6 1.2 - 2.2   Bilirubin Total 0.5 0.0 - 1.2 mg/dL   Alkaline Phosphatase 105 39 - 117 IU/L   AST 13 0 - 40 IU/L   ALT 16 0 - 32 IU/L  CBC     Status: Abnormal   Collection Time: 01/21/18  9:08 AM  Result Value Ref Range   WBC 8.0 3.4 - 10.8 x10E3/uL   RBC 4.87 3.77 - 5.28 x10E6/uL   Hemoglobin 12.5 11.1 - 15.9 g/dL   Hematocrit 39.0 34.0 - 46.6 %   MCV 80 79 - 97 fL   MCH 25.7 (L) 26.6 - 33.0 pg   MCHC 32.1 31.5 - 35.7 g/dL   RDW 15.4 12.3 - 15.4 %   Platelets 317 150 - 450 x10E3/uL  Lipid Panel w/o Chol/HDL Ratio     Status: Abnormal   Collection Time: 01/21/18  9:08 AM  Result Value Ref Range   Cholesterol, Total 213 (H) 100 - 199 mg/dL   Triglycerides 76 0 - 149 mg/dL   HDL 56 >39 mg/dL   VLDL Cholesterol Cal 15 5 - 40 mg/dL   LDL Calculated 142 (H) 0 - 99 mg/dL  Microalbumin / creatinine urine ratio     Status: None   Collection Time: 01/21/18  9:08 AM  Result Value Ref Range   Creatinine, Urine 106.8 Not Estab. mg/dL   Microalbumin, Urine 5.5 Not Estab. ug/mL   Microalb/Creat Ratio 5.1 0.0 - 30.0 mg/g creat    Comment:                      Normal:                0.0 -  30.0                      Albuminuria:          31.0 - 300.0                      Clinical albuminuria:       >300.0   T4, free     Status: None   Collection Time: 01/21/18  9:08 AM  Result Value Ref Range   Free T4 1.19 0.82 - 1.77 ng/dL  TSH     Status: None   Collection Time: 01/21/18  9:08 AM  Result Value Ref Range   TSH 1.760 0.450 - 4.500 uIU/mL  VITAMIN D 25 Hydroxy (Vit-D Deficiency, Fractures)     Status: None   Collection Time: 01/21/18  9:08 AM  Result Value Ref Range   Vit D, 25-Hydroxy 52.6 30.0 - 100.0  ng/mL    Comment: Vitamin D deficiency has been defined by the [place] practice guideline as a level of serum 25-OH vitamin D less than 20 ng/mL (1,2). The Endocrine Society went on to further define vitamin D insufficiency as a level between 21 and 29 ng/mL (2). 1. IOM (Institute of Medicine). 2010. Dietary reference    intakes for calcium and D. [place]: The    [employer]. 2. Holick MF, [place], Bischoff-Ferrari HA, et al.    Evaluation, treatment, and prevention of vitamin D    deficiency: an Endocrine Society clinical practice    guideline. JCEM. 2011 Jul; 96(7):1911-30.   UA/M w/rflx Culture, Routine     Status: None   Collection Time: 01/22/18  9:31 AM  Result Value Ref Range   Specific Gravity, UA 1.005 1.005 - 1.030   pH, UA 6.5 5.0 - 7.5   Color, UA Yellow Yellow   Appearance Ur Clear Clear   Leukocytes, UA Negative Negative   Protein, UA Negative Negative/Trace   Glucose, UA Negative Negative   Ketones, UA Negative Negative   RBC, UA Negative Negative   Bilirubin, UA Negative Negative   Urobilinogen, Ur 0.2 0.2 - 1.0 mg/dL   Nitrite, UA Negative Negative   Microscopic Examination Comment     Comment: Microscopic follows if indicated.   Microscopic Examination See below:     Comment: Microscopic was indicated and was performed.   Urinalysis Reflex Comment     Comment: This specimen will not reflex to a Urine Culture.  Microscopic Examination     Status: None   Collection Time: 01/22/18  9:31 AM  Result Value Ref Range   WBC, UA 0-5 0 - 5 /hpf   RBC, UA 0-2 0 - 2 /hpf   Epithelial Cells (non renal) None seen 0 - 10 /hpf   Casts None seen None seen /lpf   Mucus, UA Present Not Estab.   Bacteria, UA Few None seen/Few  POCT HgB A1C     Status: Abnormal   Collection Time: 01/22/18  9:58 AM  Result Value Ref Range   Hemoglobin A1C 6.5 (A) 4.0 - 5.6 %   HbA1c POC (<> result, manual entry)     HbA1c, POC  (prediabetic range)     HbA1c, POC (controlled diabetic range)     MMSE - Mini Mental State Exam 01/22/2018  Orientation to time 5  Orientation to Place 5  Registration 3  Attention/ Calculation 5  Recall 3  Language- name 2 objects 2  Language- repeat 1  Language- follow 3 step command 3  Language- read & follow direction 1  Write a sentence 1  Copy design 1  Total score 30    Functional Status Survey: Is the patient deaf or have difficulty hearing?: No Does the patient have difficulty seeing, even when wearing glasses/contacts?: No Does the patient have difficulty concentrating, remembering, or making decisions?: No Does the patient have difficulty walking or climbing stairs?: No Does the patient have difficulty dressing or bathing?: No Does the patient have difficulty doing errands alone such as visiting a doctor's office or shopping?: No   Depression  screen [facility] 2/9 01/22/2018 10/19/2017  Decreased Interest 0 0  Down, Depressed, Hopeless 0 0  PHQ - 2 Score 0 0    Fall Risk  01/22/2018 10/19/2017  Falls in the past year? No No       Assessment/Plan:  1. Encounter for general adult medical examination with abnormal findings Annual health maintenance exam today.   2. Uncontrolled type 2 diabetes mellitus with hyperglycemia (HCC) - POCT HgB A1C 6.5 today. Controlled through diet and exercise. Refer for diabetic eye exam.  - Ambulatory referral to Ophthalmology  3. Essential hypertension Add HCTZ 12.5mg  daily. Continue other bp medication as prescribed. Decrease salt and increase water in the diet. Reassess at next visit.  - hydrochlorothiazide (HYDRODIURIL) 12.5 MG tablet; Take 1 tablet (12.5 mg total) by mouth daily.  Dispense: 30 tablet; Refill: 3  4. Acute non-recurrent sinusitis, unspecified location Start amoxicillin 875mg  bid for 10 days. Use OTC medication to alleviate symptoms.  - amoxicillin (AMOXIL) 875 MG tablet; Take 1 tablet (875 mg total) by mouth 2 (two)  times daily.  Dispense: 20 tablet; Refill: 0  5. Ovarian failure - DG Bone Density; Future  6. Screening for breast cancer - MM DIGITAL SCREENING BILATERAL; Future  7. Need for vaccination against Streptococcus pneumoniae using pneumococcal conjugate vaccine 13 - Pneumococcal conjugate vaccine 13-valent IM  8. Dysuria - UA/M w/rflx Culture, Routine   General Counseling: Aleila verbalizes understanding of the findings of todays visit and agrees with plan of treatment. I have discussed any further diagnostic evaluation that may be needed or ordered today. We also reviewed her medications today. she has been encouraged to call the office with any questions or concerns that should arise related to todays visit.    Counseling:  Hypertension Counseling:   The following hypertensive lifestyle modification were recommended and discussed:  1. Limiting alcohol intake to less than 1 oz/day of ethanol:(24 oz of beer or 8 oz of wine or 2 oz of 100-proof whiskey). 2. Take baby ASA 81 mg daily. 3. Importance of regular aerobic exercise and losing weight. 4. Reduce dietary saturated fat and cholesterol intake for overall cardiovascular health. 5. Maintaining adequate dietary potassium, calcium, and magnesium intake. 6. Regular monitoring of the blood pressure. 7. Reduce sodium intake to less than 100 mmol/day (less than 2.3 gm of sodium or less than 6 gm of sodium choride)   Diabetes Counseling:  1. Addition of ACE inh/ ARB'S for nephroprotection. Microalbumin is updated  2. Diabetic foot care, prevention of complications. Podiatry consult 3. Exercise and lose weight.  4. Diabetic eye examination, Diabetic eye exam is updated  5. Monitor blood sugar closlely. nutrition counseling.  6. Sign and symptoms of hypoglycemia including shaking sweating,confusion and headaches.   This patient was seen by Leretha Pol FNP Collaboration with Dr Lavera Guise as a part of collaborative care  agreement  Orders Placed This Encounter  Procedures  . Microscopic Examination  . MM DIGITAL SCREENING BILATERAL  . DG Bone Density  . Pneumococcal conjugate vaccine 13-valent IM  . UA/M w/rflx Culture, Routine  . Ambulatory referral to Ophthalmology  . POCT HgB A1C    Meds ordered this encounter  Medications  . hydrochlorothiazide (HYDRODIURIL) 12.5 MG tablet    Sig: Take 1 tablet (12.5 mg total) by mouth daily.    Dispense:  30 tablet    Refill:  3    Order Specific Question:   Supervising Provider    Answer:   Lavera Guise [[id number]]  .  amoxicillin (AMOXIL) 875 MG tablet    Sig: Take 1 tablet (875 mg total) by mouth 2 (two) times daily.    Dispense:  20 tablet    Refill:  0    Order Specific Question:   Supervising Provider    Answer:   Lavera Guise [[id number]]    Time spent: [place], MD  Internal Medicine

## 2018-01-23 LAB — UA/M W/RFLX CULTURE, ROUTINE
Bilirubin, UA: NEGATIVE
Glucose, UA: NEGATIVE
Ketones, UA: NEGATIVE
Leukocytes, UA: NEGATIVE
Nitrite, UA: NEGATIVE
Protein, UA: NEGATIVE
RBC, UA: NEGATIVE
Specific Gravity, UA: 1.005 (ref 1.005–1.030)
Urobilinogen, Ur: 0.2 mg/dL (ref 0.2–1.0)
pH, UA: 6.5 (ref 5.0–7.5)

## 2018-01-23 LAB — MICROSCOPIC EXAMINATION
Casts: NONE SEEN /lpf
Epithelial Cells (non renal): NONE SEEN /hpf (ref 0–10)

## 2018-01-28 DIAGNOSIS — Z1239 Encounter for other screening for malignant neoplasm of breast: Secondary | ICD-10-CM

## 2018-01-28 DIAGNOSIS — Z0001 Encounter for general adult medical examination with abnormal findings: Secondary | ICD-10-CM | POA: Insufficient documentation

## 2018-01-28 DIAGNOSIS — E1165 Type 2 diabetes mellitus with hyperglycemia: Secondary | ICD-10-CM | POA: Insufficient documentation

## 2018-01-28 DIAGNOSIS — R300 Dysuria: Secondary | ICD-10-CM | POA: Insufficient documentation

## 2018-01-28 DIAGNOSIS — Z23 Encounter for immunization: Secondary | ICD-10-CM | POA: Insufficient documentation

## 2018-01-28 DIAGNOSIS — J019 Acute sinusitis, unspecified: Secondary | ICD-10-CM | POA: Insufficient documentation

## 2018-01-28 DIAGNOSIS — E2839 Other primary ovarian failure: Secondary | ICD-10-CM | POA: Insufficient documentation

## 2018-02-10 DIAGNOSIS — H4010X0 Unspecified open-angle glaucoma, stage unspecified: Secondary | ICD-10-CM | POA: Diagnosis not present

## 2018-02-15 ENCOUNTER — Encounter: Payer: Self-pay | Admitting: Nurse Practitioner

## 2018-02-15 ENCOUNTER — Ambulatory Visit (INDEPENDENT_AMBULATORY_CARE_PROVIDER_SITE_OTHER): Payer: PPO | Admitting: Nurse Practitioner

## 2018-02-15 VITALS — BP 118/80 | HR 71 | Resp 16 | Ht 67.0 in | Wt 227.0 lb

## 2018-02-15 DIAGNOSIS — J301 Allergic rhinitis due to pollen: Secondary | ICD-10-CM | POA: Diagnosis not present

## 2018-02-15 DIAGNOSIS — E1165 Type 2 diabetes mellitus with hyperglycemia: Secondary | ICD-10-CM | POA: Diagnosis not present

## 2018-02-15 DIAGNOSIS — K219 Gastro-esophageal reflux disease without esophagitis: Secondary | ICD-10-CM | POA: Diagnosis not present

## 2018-02-15 DIAGNOSIS — I10 Essential (primary) hypertension: Secondary | ICD-10-CM | POA: Diagnosis not present

## 2018-02-15 NOTE — Progress Notes (Signed)
[facility] [place], [place] [address]  Internal MEDICINE  Office Visit Note  Patient Name: Holly Forbes  [id number]  [id number]  Date of Service: 02/15/2018  Chief Complaint  Patient presents with  . Hypertension    follow up  . Diabetes    Blood pressure very elevated at her last visit. Added HCTZ 12.5mg  tablets daily. Blood pressure has improved immensely. She has no negative side effects associated with the addition of this medication.   Hypertension  This is a chronic problem. The problem has been rapidly improving since onset. The problem is controlled. Associated symptoms include headaches. Pertinent negatives include no shortness of breath. There are no associated agents to hypertension. Risk factors for coronary artery disease include diabetes mellitus and post-menopausal state. Past treatments include diuretics and ACE inhibitors. The current treatment provides moderate improvement. Compliance problems include exercise.        Current Medication: Outpatient Encounter Medications as of 02/15/2018  Medication Sig  . aspirin 81 MG tablet Take 81 mg by mouth daily. am  . cyanocobalamin 1000 MCG tablet Take 1,000 mcg by mouth daily.  . dorzolamide-timolol (COSOPT) 22.3-6.8 MG/ML ophthalmic solution INSTILL 1 DROP INTO BOTH EYES QHS  . hydrochlorothiazide (HYDRODIURIL) 12.5 MG tablet Take 1 tablet (12.5 mg total) by mouth daily.  Marland Kitchen ibuprofen (ADVIL,MOTRIN) 400 MG tablet TAKE 1 TABLET BY MOUTH TWICE DAILY WITH FOOD  . lisinopril (PRINIVIL,ZESTRIL) 20 MG tablet Take 1 tablet (20 mg total) by mouth daily. am  . Multiple Vitamin (MULTIVITAMIN) tablet Take 1 tablet by mouth daily. Doesn't take everyday  . pantoprazole (PROTONIX) 40 MG tablet Take 1 tablet (40 mg total) by mouth daily.  Marland Kitchen VITAMIN D, CHOLECALCIFEROL, PO Take 1 Dose by mouth daily. Not on routine basis  . latanoprost (XALATAN) 0.005 % ophthalmic solution Place 1 drop into both eyes.   .  [DISCONTINUED] amoxicillin (AMOXIL) 875 MG tablet Take 1 tablet (875 mg total) by mouth 2 (two) times daily. (Patient not taking: Reported on 02/15/2018)   Facility-Administered Encounter Medications as of 02/15/2018  Medication  . triamcinolone acetonide (KENALOG) 10 MG/ML injection 10 mg    Surgical History: Past Surgical History:  Procedure Laterality Date  . ABDOMINAL HYSTERECTOMY    . CHOLECYSTECTOMY    . COLONOSCOPY N/A 09/25/2014   Procedure: COLONOSCOPY;  Surgeon: Lucilla Lame, MD;  Location: [place];  Service: Gastroenterology;  Laterality: N/A;  cecum time- 0623  . CYST REMOVAL NECK    . ESOPHAGOGASTRODUODENOSCOPY N/A 09/25/2014   Procedure: ESOPHAGOGASTRODUODENOSCOPY (EGD);  Surgeon: Lucilla Lame, MD;  Location: [place];  Service: Gastroenterology;  Laterality: N/A;  . POLYPECTOMY  09/25/2014   Procedure: POLYPECTOMY INTESTINAL;  Surgeon: Lucilla Lame, MD;  Location: [place];  Service: Gastroenterology;;    Medical History: Past Medical History:  Diagnosis Date  . Abdominal pain   . 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 Back pain    Left sciatica, low back pain  . GERD (gastroesophageal reflux disease)   . Glaucoma   . Hypertension   . Vitamin D deficiency     Family History: Family History  Problem Relation Age of Onset  . Heart disease Sister   . Diabetes Sister   . Hypertension Sister   . Heart disease Brother   . Diabetes Brother   . Hypertension Brother     Social History   Socioeconomic History  . Marital status: Married    Spouse name: Not  on file  . Number of children: Not on file  . Years of education: Not on file  . Highest education level: Not on file  Occupational History  . Not on file  Social Needs  . Financial resource strain: Not on file  . Food insecurity:    Worry: Not on file    Inability: Not on file  . Transportation needs:    Medical: Not on file    Non-medical: Not on file  Tobacco Use  .  Smoking status: Former Smoker    Packs/day: 0.25    Years: 15.00    Pack years: 3.75    Types: Cigarettes  . Smokeless tobacco: Never Used  Substance and Sexual Activity  . Alcohol use: Yes    Comment: 3-5 glasses wine/month  . Drug use: Not on file  . Sexual activity: Not on file  Lifestyle  . Physical activity:    Days per week: Not on file    Minutes per session: Not on file  . Stress: Not on file  Relationships  . Social connections:    Talks on phone: Not on file    Gets together: Not on file    Attends religious service: Not on file    Active member of club or organization: Not on file    Attends meetings of clubs or organizations: Not on file    Relationship status: Not on file  . Intimate partner violence:    Fear of current or ex partner: Not on file    Emotionally abused: Not on file    Physically abused: Not on file    Forced sexual activity: Not on file  Other Topics Concern  . Not on file  Social History Narrative  . Not on file      Review of Systems  Constitutional: Negative for fatigue.  HENT: Negative for congestion, ear pain, postnasal drip and sinus pressure.   Eyes: Negative.   Respiratory: Negative for cough and shortness of breath.   Cardiovascular:       Blood pressure improved today.  Gastrointestinal: Negative for abdominal distention, abdominal pain, constipation, nausea and vomiting.  Endocrine: Negative for cold intolerance, heat intolerance, polydipsia, polyphagia and polyuria.       Blood sugars doing well   Genitourinary: Negative.   Musculoskeletal: Negative for arthralgias, back pain and myalgias.  Skin: Negative for rash.  Allergic/Immunologic: Positive for environmental allergies.  Neurological: Positive for dizziness and headaches.  Hematological: Negative for adenopathy.  Psychiatric/Behavioral: Negative for behavioral problems and dysphoric mood. The patient is not nervous/anxious.     Today's Vitals   02/15/18 0832  BP:  118/80  Pulse: 71  Resp: 16  SpO2: 98%  Weight: 227 lb (103 kg)  Height: 5\' 7"  (1.702 m)    Physical Exam  Constitutional: She is oriented to person, place, and time. She appears well-developed and well-nourished. No distress.  HENT:  Head: Normocephalic and atraumatic.  Nose: Nose normal.  Mouth/Throat: Oropharynx is clear and moist. No oropharyngeal exudate.  Eyes: Pupils are equal, round, and reactive to light. Conjunctivae and EOM are normal.  Neck: Normal range of motion. Neck supple. No JVD present. Carotid bruit is not present. No tracheal deviation present. No thyromegaly present.  Cardiovascular: Normal rate, regular rhythm and normal heart sounds. Exam reveals no gallop and no friction rub.  No murmur heard. Pulmonary/Chest: Effort normal and breath sounds normal. No respiratory distress. She has no wheezes. She has no rales. She exhibits no tenderness.  Abdominal: Soft. Bowel sounds are normal. There is hepatosplenomegaly. There is no tenderness. There is no CVA tenderness, no tenderness at McBurney's point and negative Murphy's sign.  Musculoskeletal: Normal range of motion.  Lymphadenopathy:    She has no cervical adenopathy.  Neurological: She is alert and oriented to person, place, and time. No cranial nerve deficit.  Skin: Skin is warm and dry. She is not diaphoretic.  Psychiatric: She has a normal mood and affect. Her behavior is normal. Judgment and thought content normal.  Nursing note and vitals reviewed.  Assessment/Plan: 1. Essential hypertension Much improved. Continue HCTZ with lisinopril every day.   2. Uncontrolled type 2 diabetes mellitus with hyperglycemia (HCC) Well managed without medications. Continue to follow closely.   3. Allergic rhinitis due to pollen, unspecified seasonality Suggested adding claritin or zyrtec every evening to help prevent and treat allergy symptoms.   4. Gastroesophageal reflux disease without esophagitis Encouraged her to  make follow up appointment with GI.   General Counseling: shanelle clontz understanding of the findings of todays visit and agrees with plan of treatment. I have discussed any further diagnostic evaluation that may be needed or ordered today. We also reviewed her medications today. she has been encouraged to call the office with any questions or concerns that should arise related to todays visit.  Hypertension Counseling:   The following hypertensive lifestyle modification were recommended and discussed:  1. Limiting alcohol intake to less than 1 oz/day of ethanol:(24 oz of beer or 8 oz of wine or 2 oz of 100-proof whiskey). 2. Take baby ASA 81 mg daily. 3. Importance of regular aerobic exercise and losing weight. 4. Reduce dietary saturated fat and cholesterol intake for overall cardiovascular health. 5. Maintaining adequate dietary potassium, calcium, and magnesium intake. 6. Regular monitoring of the blood pressure. 7. Reduce sodium intake to less than 100 mmol/day (less than 2.3 gm of sodium or less than 6 gm of sodium choride)   This patient was seen by [place] with Dr Lavera Guise as a part of collaborative care agreement    Time spent: 27 Minutes      Dr Lavera Guise Internal medicine

## 2018-02-23 DIAGNOSIS — K219 Gastro-esophageal reflux disease without esophagitis: Secondary | ICD-10-CM | POA: Diagnosis not present

## 2018-02-23 DIAGNOSIS — R1013 Epigastric pain: Secondary | ICD-10-CM | POA: Diagnosis not present

## 2018-03-08 ENCOUNTER — Ambulatory Visit
Admission: RE | Admit: 2018-03-08 | Discharge: 2018-03-08 | Disposition: A | Discharge status: Home / Self Care | Payer: PPO | Source: Ambulatory Visit | Attending: Nurse Practitioner | Admitting: Nurse Practitioner

## 2018-03-08 DIAGNOSIS — E2839 Other primary ovarian failure: Secondary | ICD-10-CM | POA: Diagnosis not present

## 2018-03-08 DIAGNOSIS — Z1231 Encounter for screening mammogram for malignant neoplasm of breast: Secondary | ICD-10-CM | POA: Diagnosis not present

## 2018-03-08 DIAGNOSIS — M85851 Other specified disorders of bone density and structure, right thigh: Secondary | ICD-10-CM | POA: Diagnosis not present

## 2018-03-08 DIAGNOSIS — Z780 Asymptomatic menopausal state: Secondary | ICD-10-CM | POA: Diagnosis not present

## 2018-03-08 DIAGNOSIS — Z1239 Encounter for other screening for malignant neoplasm of breast: Secondary | ICD-10-CM

## 2018-03-08 DIAGNOSIS — Z13820 Encounter for screening for osteoporosis: Secondary | ICD-10-CM | POA: Diagnosis not present

## 2018-03-24 DIAGNOSIS — H401131 Primary open-angle glaucoma, bilateral, mild stage: Secondary | ICD-10-CM | POA: Diagnosis not present

## 2018-03-26 ENCOUNTER — Other Ambulatory Visit: Payer: Self-pay | Admitting: Nurse Practitioner

## 2018-03-26 DIAGNOSIS — K2100 Gastro-esophageal reflux disease with esophagitis, without bleeding: Secondary | ICD-10-CM

## 2018-04-06 ENCOUNTER — Encounter: Payer: Self-pay | Admitting: *Deleted

## 2018-04-07 ENCOUNTER — Encounter
Admission: RE | Disposition: A | Discharge status: Home / Self Care | Payer: Self-pay | Source: Ambulatory Visit | Attending: Internal Medicine

## 2018-04-07 ENCOUNTER — Ambulatory Visit
Admission: RE | Admit: 2018-04-07 | Discharge: 2018-04-07 | Disposition: A | Discharge status: Home / Self Care | Payer: PPO | Source: Ambulatory Visit | Attending: Internal Medicine | Admitting: Internal Medicine

## 2018-04-07 ENCOUNTER — Ambulatory Visit: Payer: PPO | Admitting: Certified Registered Nurse Anesthetist

## 2018-04-07 ENCOUNTER — Encounter: Payer: Self-pay | Admitting: *Deleted

## 2018-04-07 DIAGNOSIS — Z9049 Acquired absence of other specified parts of digestive tract: Secondary | ICD-10-CM | POA: Diagnosis not present

## 2018-04-07 DIAGNOSIS — M5442 Lumbago with sciatica, left side: Secondary | ICD-10-CM | POA: Insufficient documentation

## 2018-04-07 DIAGNOSIS — M170 Bilateral primary osteoarthritis of knee: Secondary | ICD-10-CM | POA: Diagnosis not present

## 2018-04-07 DIAGNOSIS — E559 Vitamin D deficiency, unspecified: Secondary | ICD-10-CM | POA: Diagnosis not present

## 2018-04-07 DIAGNOSIS — M19041 Primary osteoarthritis, right hand: Secondary | ICD-10-CM | POA: Insufficient documentation

## 2018-04-07 DIAGNOSIS — Z87891 Personal history of nicotine dependence: Secondary | ICD-10-CM | POA: Diagnosis not present

## 2018-04-07 DIAGNOSIS — Z79899 Other long term (current) drug therapy: Secondary | ICD-10-CM | POA: Diagnosis not present

## 2018-04-07 DIAGNOSIS — R1013 Epigastric pain: Secondary | ICD-10-CM | POA: Diagnosis not present

## 2018-04-07 DIAGNOSIS — K210 Gastro-esophageal reflux disease with esophagitis: Secondary | ICD-10-CM | POA: Diagnosis not present

## 2018-04-07 DIAGNOSIS — K3189 Other diseases of stomach and duodenum: Secondary | ICD-10-CM | POA: Insufficient documentation

## 2018-04-07 DIAGNOSIS — Z791 Long term (current) use of non-steroidal anti-inflammatories (NSAID): Secondary | ICD-10-CM | POA: Diagnosis not present

## 2018-04-07 DIAGNOSIS — K2970 Gastritis, unspecified, without bleeding: Secondary | ICD-10-CM | POA: Insufficient documentation

## 2018-04-07 DIAGNOSIS — I10 Essential (primary) hypertension: Secondary | ICD-10-CM | POA: Diagnosis not present

## 2018-04-07 DIAGNOSIS — Z86010 Personal history of colonic polyps: Secondary | ICD-10-CM | POA: Diagnosis not present

## 2018-04-07 DIAGNOSIS — K219 Gastro-esophageal reflux disease without esophagitis: Secondary | ICD-10-CM | POA: Diagnosis not present

## 2018-04-07 DIAGNOSIS — Z7982 Long term (current) use of aspirin: Secondary | ICD-10-CM | POA: Diagnosis not present

## 2018-04-07 DIAGNOSIS — K2960 Other gastritis without bleeding: Secondary | ICD-10-CM | POA: Diagnosis not present

## 2018-04-07 DIAGNOSIS — H409 Unspecified glaucoma: Secondary | ICD-10-CM | POA: Diagnosis not present

## 2018-04-07 DIAGNOSIS — M19042 Primary osteoarthritis, left hand: Secondary | ICD-10-CM | POA: Insufficient documentation

## 2018-04-07 DIAGNOSIS — E119 Type 2 diabetes mellitus without complications: Secondary | ICD-10-CM | POA: Insufficient documentation

## 2018-04-07 HISTORY — PX: ESOPHAGOGASTRODUODENOSCOPY (EGD) WITH PROPOFOL: SHX5813

## 2018-04-07 SURGERY — ESOPHAGOGASTRODUODENOSCOPY (EGD) WITH PROPOFOL
Anesthesia: General

## 2018-04-07 MED ORDER — SODIUM CHLORIDE 0.9 % IV SOLN
INTRAVENOUS | Status: DC
  Administered 2018-04-07: 08:00:00 via INTRAVENOUS

## 2018-04-07 MED ORDER — LIDOCAINE HCL (CARDIAC) PF 100 MG/5ML IV SOSY
PREFILLED_SYRINGE | INTRAVENOUS | Status: DC | PRN
  Administered 2018-04-07: 100 mg via INTRAVENOUS

## 2018-04-07 MED ORDER — LIDOCAINE HCL (PF) 2 % IJ SOLN
INTRAMUSCULAR | Status: AC
  Filled 2018-04-07: qty 10

## 2018-04-07 MED ORDER — PROPOFOL 500 MG/50ML IV EMUL
INTRAVENOUS | Status: DC | PRN
  Administered 2018-04-07: 150 ug/kg/min via INTRAVENOUS

## 2018-04-07 MED ORDER — PROPOFOL 10 MG/ML IV BOLUS
INTRAVENOUS | Status: DC | PRN
  Administered 2018-04-07: 70 mg via INTRAVENOUS

## 2018-04-07 MED ORDER — PROPOFOL 500 MG/50ML IV EMUL
INTRAVENOUS | Status: AC
  Filled 2018-04-07: qty 200

## 2018-04-07 MED ORDER — GLYCOPYRROLATE 0.2 MG/ML IJ SOLN
INTRAMUSCULAR | Status: DC | PRN
  Administered 2018-04-07: 0.2 mg via INTRAVENOUS

## 2018-04-07 MED ORDER — SUCCINYLCHOLINE CHLORIDE 20 MG/ML IJ SOLN
INTRAMUSCULAR | Status: AC
  Filled 2018-04-07: qty 1

## 2018-04-07 NOTE — Anesthesia Postprocedure Evaluation (Signed)
Anesthesia Post Note  Patient: Holly Forbes  Procedure(s) Performed: ESOPHAGOGASTRODUODENOSCOPY (EGD) WITH PROPOFOL (N/A )  Patient location during evaluation: Endoscopy Anesthesia Type: General Level of consciousness: awake and alert Pain management: pain level controlled Vital Signs Assessment: post-procedure vital signs reviewed and stable Respiratory status: spontaneous breathing, nonlabored ventilation and respiratory function stable Cardiovascular status: blood pressure returned to baseline and stable Postop Assessment: no apparent nausea or vomiting Anesthetic complications: no     Last Vitals:  Vitals:   04/07/18 0840 04/07/18 0846  BP:  (!) 150/90  Pulse: 69 77  Resp: 12 17  Temp:    SpO2: 99% 99%    Last Pain:  Vitals:   04/07/18 0817  TempSrc: Tympanic  PainSc:                  Alphonsus Sias

## 2018-04-07 NOTE — Anesthesia Procedure Notes (Signed)
Performed by: Harbor Vanover, CRNA Pre-anesthesia Checklist: Patient identified, Emergency Drugs available, Suction available, Patient being monitored and Timeout performed Patient Re-evaluated:Patient Re-evaluated prior to induction Oxygen Delivery Method: Nasal cannula Induction Type: IV induction       

## 2018-04-07 NOTE — Anesthesia Preprocedure Evaluation (Signed)
Anesthesia Evaluation  Patient identified by MRN, date of birth, ID band Patient awake    Reviewed: Allergy & Precautions, H&P , NPO status , reviewed documented beta blocker date and time   Airway Mallampati: III  TM Distance: >3 FB Neck ROM: full    Dental  (+) Caps   Pulmonary former smoker,    Pulmonary exam normal        Cardiovascular hypertension, Normal cardiovascular exam     Neuro/Psych    GI/Hepatic GERD  ,  Endo/Other  diabetes  Renal/GU      Musculoskeletal  (+) Arthritis ,   Abdominal   Peds  Hematology   Anesthesia Other Findings Past Medical History: No date: Abdominal pain No date: Arthritis     Comment:  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 No date: Back pain     Comment:  Left sciatica, low back pain No date: GERD (gastroesophageal reflux disease) No date: Glaucoma No date: Hypertension No date: Vitamin D deficiency  Past Surgical History: No date: ABDOMINAL HYSTERECTOMY 2015: BREAST EXCISIONAL BIOPSY; Left     Comment:  cyst removed No date: CHOLECYSTECTOMY 09/25/2014: COLONOSCOPY; N/A     Comment:  Procedure: COLONOSCOPY;  Surgeon: Lucilla Lame, MD;                Location: [place];  Service:               Gastroenterology;  Laterality: N/A;  cecum time- 0321 No date: CYST REMOVAL NECK 09/25/2014: ESOPHAGOGASTRODUODENOSCOPY; N/A     Comment:  Procedure: ESOPHAGOGASTRODUODENOSCOPY (EGD);  Surgeon:               Lucilla Lame, MD;  Location: [place];                Service: Gastroenterology;  Laterality: N/A; 09/25/2014: POLYPECTOMY     Comment:  Procedure: POLYPECTOMY INTESTINAL;  Surgeon: Lucilla Lame, MD;  Location: [place];  Service:               Gastroenterology;;  BMI    Body Mass Index:  34.46 kg/m      Reproductive/Obstetrics                             Anesthesia Physical Anesthesia Plan  ASA: II  Anesthesia  Plan: General   Post-op Pain Management:    Induction: Intravenous  PONV Risk Score and Plan: Treatment may vary due to age or medical condition and TIVA  Airway Management Planned: Nasal Cannula and Natural Airway  Additional Equipment:   Intra-op Plan:   Post-operative Plan:   Informed Consent: I have reviewed the patients History and Physical, chart, labs and discussed the procedure including the risks, benefits and alternatives for the proposed anesthesia with the patient or authorized representative who has indicated his/her understanding and acceptance.   Dental Advisory Given  Plan Discussed with: CRNA  Anesthesia Plan Comments:         Anesthesia Quick Evaluation

## 2018-04-07 NOTE — H&P (Signed)
Outpatient short stay form Pre-procedure 04/07/2018 8:03 AM Holly Forbes K. Alice Reichert, M.D.  Primary Physician: Clayborn Bigness, MD  Reason for visit: Epigastric pain, GERD not responding to medical therapy.  History of present illness: 67 year old female presents with intermittent epigastric pain for the last 2 years with GERD symptoms that have not completely responded to Nexium or pantoprazole therapy.  Heartburn interestingly is not strictly related to meals or in the postmeal time.  There is no complaint of dysphagia, weight loss or hematemesis.    Current Facility-Administered Medications:  .  0.9 %  sodium chloride infusion, , Intravenous, Continuous, [place], Benay Pike, MD, Last Rate: 20 mL/hr at 04/07/18 7619  Facility-Administered Medications Prior to Admission  Medication Dose Route Frequency Provider Last Rate Last Dose  . triamcinolone acetonide (KENALOG) 10 MG/ML injection 10 mg  10 mg Other Once Landis Martins, DPM       Medications Prior to Admission  Medication Sig Dispense Refill Last Dose  . aspirin 81 MG tablet Take 81 mg by mouth daily. am   04/06/2018 at Unknown time  . cyanocobalamin 1000 MCG tablet Take 1,000 mcg by mouth daily.   04/06/2018 at Unknown time  . dorzolamide-timolol (COSOPT) 22.3-6.8 MG/ML ophthalmic solution INSTILL 1 DROP INTO BOTH EYES QHS  5 04/06/2018 at Unknown time  . hydrochlorothiazide (HYDRODIURIL) 12.5 MG tablet Take 1 tablet (12.5 mg total) by mouth daily. 30 tablet 3 04/06/2018 at Unknown time  . latanoprost (XALATAN) 0.005 % ophthalmic solution Place 1 drop into both eyes.   3 04/06/2018 at Unknown time  . lisinopril (PRINIVIL,ZESTRIL) 20 MG tablet Take 1 tablet (20 mg total) by mouth daily. am 30 tablet 6 04/06/2018 at Unknown time  . Multiple Vitamin (MULTIVITAMIN) tablet Take 1 tablet by mouth daily. Doesn't take everyday   04/06/2018 at Unknown time  . pantoprazole (PROTONIX) 40 MG tablet TAKE 1 TABLET(40 MG) BY MOUTH DAILY 30 tablet 1 04/06/2018  at Unknown time  . VITAMIN D, CHOLECALCIFEROL, PO Take 1 Dose by mouth daily. Not on routine basis   04/06/2018 at Unknown time  . ibuprofen (ADVIL,MOTRIN) 400 MG tablet TAKE 1 TABLET BY MOUTH TWICE DAILY WITH FOOD 60 tablet 0 Taking     Allergies  Allergen Reactions  . Other      Past Medical History:  Diagnosis Date  . Abdominal pain   . 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 Back pain    Left sciatica, low back pain  . GERD (gastroesophageal reflux disease)   . Glaucoma   . Hypertension   . Vitamin D deficiency     Review of systems:  Otherwise negative.    Physical Exam  Gen: Alert, oriented. Appears stated age.  HEENT: [place]/AT. PERRLA. Lungs: CTA, no wheezes. CV: RR nl S1, S2. Abd: soft, benign, no masses. BS+ Ext: No edema. Pulses 2+    Planned procedures: Proceed with EGD. The patient understands the nature of the planned procedure, indications, risks, alternatives and potential complications including but not limited to bleeding, infection, perforation, damage to internal organs and possible oversedation/side effects from anesthesia. The patient agrees and gives consent to proceed.  Please refer to procedure notes for findings, recommendations and patient disposition/instructions.     Yogesh Cominsky K. Alice Reichert, M.D. Gastroenterology 04/07/2018  8:03 AM

## 2018-04-07 NOTE — Interval H&P Note (Signed)
History and Physical Interval Note:  04/07/2018 8:05 AM  Holly Forbes  has presented today for surgery, with the diagnosis of GERD, EPIGASTRIC PAIN  The various methods of treatment have been discussed with the patient and family. After consideration of risks, benefits and other options for treatment, the patient has consented to  Procedure(s): ESOPHAGOGASTRODUODENOSCOPY (EGD) WITH PROPOFOL (N/A) as a surgical intervention .  The patient's history has been reviewed, patient examined, no change in status, stable for surgery.  I have reviewed the patient's chart and labs.  Questions were answered to the patient's satisfaction.     [place], [place]

## 2018-04-07 NOTE — Op Note (Addendum)
[facility] Patient Name: Holly Forbes Procedure Date: 04/07/2018 7:28 AM MRN: [id number] Account #: [id number] Date of Birth: 1950-10-10 Admit Type: Outpatient Age: 67 Room: [facility] ENDO ROOM 3 Gender: Female Note Status: Finalized Procedure:            Upper GI endoscopy Indications:          Epigastric abdominal pain, Suspected esophageal reflux,                        Failure to respond to medical treatment Providers:            Benay Pike. Alice Reichert MD, MD Referring MD:         Lavera Guise, MD (Referring MD) Medicines:            Propofol per Anesthesia Complications:        No immediate complications. Procedure:            Pre-Anesthesia Assessment:                       - The risks and benefits of the procedure and the                        sedation options and risks were discussed with the                        patient. All questions were answered and informed                        consent was obtained.                       - Patient identification and proposed procedure were                        verified prior to the procedure by the nurse. The                        procedure was verified in the procedure room.                       - ASA Grade Assessment: III - A patient with severe                        systemic disease.                       - After reviewing the risks and benefits, the patient                        was deemed in satisfactory condition to undergo the                        procedure.                       After obtaining informed consent, the endoscope was                        passed under direct vision. Throughout the procedure,  the patient's blood pressure, pulse, and oxygen                        saturations were monitored continuously. The Endoscope                        was introduced through the mouth, and advanced to the                        third part of duodenum. The upper  GI endoscopy was                        accomplished without difficulty. The patient tolerated                        the procedure well. Findings:      The examined esophagus was normal. Biopsies were obtained from the       proximal and distal esophagus with cold forceps for histology of       suspected eosinophilic esophagitis.      Localized mildly erythematous mucosa without bleeding was found in the       gastric antrum. Biopsies were taken with a cold forceps for Helicobacter       pylori testing.      The cardia and gastric fundus were normal on retroflexion.      The examined duodenum was normal.      The exam was otherwise without abnormality. Impression:           - Normal esophagus. Biopsied.                       - Erythematous mucosa in the antrum. Biopsied.                       - Normal examined duodenum.                       - The examination was otherwise normal. Recommendation:       - Patient has a contact number available for                        emergencies. The signs and symptoms of potential                        delayed complications were discussed with the patient.                        Return to normal activities tomorrow. Written discharge                        instructions were provided to the patient.                       - Resume previous diet.                       - Continue present medications.                       - Await pathology results.                       -  Return to physician assistant in 2 months. Procedure Code(s):    --- Professional ---                       [phone], Esophagogastroduodenoscopy, flexible, transoral;                        with biopsy, single or multiple Diagnosis Code(s):    --- Professional ---                       R10.13, Epigastric pain                       K31.89, Other diseases of stomach and duodenum CPT copyright 2018 American Medical Association. All rights reserved. The codes documented in this report are  preliminary and upon coder review may  be revised to meet current compliance requirements. Efrain Sella MD, MD 04/07/2018 8:15:20 AM This report has been signed electronically. Number of Addenda: 0 Note Initiated On: 04/07/2018 7:28 AM      [facility]

## 2018-04-07 NOTE — Transfer of Care (Signed)
Immediate Anesthesia Transfer of Care Note  Patient: Holly Forbes  Procedure(s) Performed: ESOPHAGOGASTRODUODENOSCOPY (EGD) WITH PROPOFOL (N/A )  Patient Location: PACU and Endoscopy Unit  Anesthesia Type:General  Level of Consciousness: drowsy  Airway & Oxygen Therapy: Patient Spontanous Breathing and Patient connected to nasal cannula oxygen  Post-op Assessment: Report given to RN and Post -op Vital signs reviewed and stable  Post vital signs: Reviewed and stable  Last Vitals:  Vitals Value Taken Time  BP    Temp    Pulse    Resp    SpO2      Last Pain:  Vitals:   04/07/18 0728  TempSrc: Tympanic  PainSc: 0-No pain         Complications: No apparent anesthesia complications

## 2018-04-07 NOTE — Anesthesia Post-op Follow-up Note (Signed)
Anesthesia QCDR form completed.        

## 2018-04-08 LAB — SURGICAL PATHOLOGY

## 2018-04-09 ENCOUNTER — Encounter: Payer: Self-pay | Admitting: Internal Medicine

## 2018-05-04 ENCOUNTER — Other Ambulatory Visit: Payer: Self-pay

## 2018-05-04 MED ORDER — LISINOPRIL 20 MG PO TABS: 20.0000 mg | ORAL_TABLET | Freq: Every day | ORAL | 6 refills | Status: DC

## 2018-05-17 ENCOUNTER — Other Ambulatory Visit: Payer: Self-pay

## 2018-05-17 ENCOUNTER — Ambulatory Visit: Payer: PPO | Admitting: Nurse Practitioner

## 2018-05-17 ENCOUNTER — Encounter: Payer: Self-pay | Admitting: Nurse Practitioner

## 2018-05-17 VITALS — BP 132/78 | HR 71 | Resp 16 | Ht 67.0 in | Wt 231.6 lb

## 2018-05-17 DIAGNOSIS — M79672 Pain in left foot: Secondary | ICD-10-CM

## 2018-05-17 DIAGNOSIS — E119 Type 2 diabetes mellitus without complications: Secondary | ICD-10-CM

## 2018-05-17 DIAGNOSIS — K219 Gastro-esophageal reflux disease without esophagitis: Secondary | ICD-10-CM | POA: Diagnosis not present

## 2018-05-17 DIAGNOSIS — M150 Primary generalized (osteo)arthritis: Secondary | ICD-10-CM | POA: Diagnosis not present

## 2018-05-17 DIAGNOSIS — I10 Essential (primary) hypertension: Secondary | ICD-10-CM | POA: Diagnosis not present

## 2018-05-17 HISTORY — DX: Pain in left foot: M79.672

## 2018-05-17 LAB — POCT GLYCOSYLATED HEMOGLOBIN (HGB A1C): Hemoglobin A1C: 7.1 % — AB (ref 4.0–5.6)

## 2018-05-17 MED ORDER — HYDROCHLOROTHIAZIDE 12.5 MG PO TABS: 12.5000 mg | ORAL_TABLET | Freq: Every day | ORAL | 5 refills | Status: DC

## 2018-05-17 MED ORDER — IBUPROFEN 400 MG PO TABS: 400.0000 mg | ORAL_TABLET | Freq: Two times a day (BID) | ORAL | 2 refills | Status: DC

## 2018-05-17 NOTE — Progress Notes (Signed)
[facility] [place], [place] [address]  Internal MEDICINE  Office Visit Note  Patient Name: Holly Forbes  [id number]  [id number]  Date of Service: 05/17/2018  Chief Complaint  Patient presents with  . Medical Management of Chronic Issues    3 month follow up  . Diabetes  . Quality Metric Gaps    pt does not get the flu vaccine    The patient is here for routine follow up visit. She is c/o left heel pain. This is intermittent. Does radiate up the left leg and into the hip. Taking ibuprofen does not help the pain. Hinders her ability to exercise.  She states that she has some intermittent ear pain. Left ear will get sharp, shooting pain, which subsides very quickly. Right ear will have intermittent ringing without pain. Has had some mild sinus congestion and headache, but this has resolved. Blood pressure is improved and stable. Taking both blood pressure medications consistently. Denies chest pain, chest pressure, or shortness of breath, or headache.  Blood sugars elevated recently. She currently controls diabetes through diet and exercise. Holidays have caused a lapse in good diet control, eating more sweets and carbohydrates. Denies polydipsia or polyuria.        Current Medication: Outpatient Encounter Medications as of 05/17/2018  Medication Sig  . aspirin 81 MG tablet Take 81 mg by mouth daily. am  . cyanocobalamin 1000 MCG tablet Take 1,000 mcg by mouth daily.  . dorzolamide-timolol (COSOPT) 22.3-6.8 MG/ML ophthalmic solution INSTILL 1 DROP INTO BOTH EYES QHS  . ibuprofen (ADVIL,MOTRIN) 400 MG tablet Take 1 tablet (400 mg total) by mouth 2 (two) times daily with a meal.  . latanoprost (XALATAN) 0.005 % ophthalmic solution Place 1 drop into both eyes.   Marland Kitchen lisinopril (PRINIVIL,ZESTRIL) 20 MG tablet Take 1 tablet (20 mg total) by mouth daily. am  . Multiple Vitamin (MULTIVITAMIN) tablet Take 1 tablet by mouth daily. Doesn't take everyday  .  pantoprazole (PROTONIX) 40 MG tablet TAKE 1 TABLET(40 MG) BY MOUTH DAILY  . VITAMIN D, CHOLECALCIFEROL, PO Take 1 Dose by mouth daily. Not on routine basis  . [DISCONTINUED] hydrochlorothiazide (HYDRODIURIL) 12.5 MG tablet Take 1 tablet (12.5 mg total) by mouth daily.  . [DISCONTINUED] ibuprofen (ADVIL,MOTRIN) 400 MG tablet TAKE 1 TABLET BY MOUTH TWICE DAILY WITH FOOD   Facility-Administered Encounter Medications as of 05/17/2018  Medication  . triamcinolone acetonide (KENALOG) 10 MG/ML injection 10 mg    Surgical History: Past Surgical History:  Procedure Laterality Date  . ABDOMINAL HYSTERECTOMY    . BREAST EXCISIONAL BIOPSY Left 2015   cyst removed  . CHOLECYSTECTOMY    . COLONOSCOPY N/A 09/25/2014   Procedure: COLONOSCOPY;  Surgeon: Lucilla Lame, MD;  Location: [place];  Service: Gastroenterology;  Laterality: N/A;  cecum time- 7628  . CYST REMOVAL NECK    . ESOPHAGOGASTRODUODENOSCOPY N/A 09/25/2014   Procedure: ESOPHAGOGASTRODUODENOSCOPY (EGD);  Surgeon: Lucilla Lame, MD;  Location: [place];  Service: Gastroenterology;  Laterality: N/A;  . ESOPHAGOGASTRODUODENOSCOPY (EGD) WITH PROPOFOL N/A 04/07/2018   Procedure: ESOPHAGOGASTRODUODENOSCOPY (EGD) WITH PROPOFOL;  Surgeon: Toledo, Benay Pike, MD;  Location: [facility];  Service: Gastroenterology;  Laterality: N/A;  . POLYPECTOMY  09/25/2014   Procedure: POLYPECTOMY INTESTINAL;  Surgeon: Lucilla Lame, MD;  Location: [place];  Service: Gastroenterology;;    Medical History: Past Medical History:  Diagnosis Date  . Abdominal pain   . 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  Back pain    Left sciatica, low back pain  . GERD (gastroesophageal reflux disease)   . Glaucoma   . Hypertension   . Vitamin D deficiency     Family History: Family History  Problem Relation Age of Onset  . Heart disease Sister   . Diabetes Sister   . Hypertension Sister   . Heart disease Brother   . Diabetes Brother    . Hypertension Brother   . Breast cancer Daughter 5  . Colon cancer Mother   . Stroke Mother   . Lung cancer Father     Social History   Socioeconomic History  . Marital status: Married    Spouse name: Not on file  . Number of children: Not on file  . Years of education: Not on file  . Highest education level: Not on file  Occupational History  . Not on file  Social Needs  . Financial resource strain: Not on file  . Food insecurity:    Worry: Not on file    Inability: Not on file  . Transportation needs:    Medical: Not on file    Non-medical: Not on file  Tobacco Use  . Smoking status: Former Smoker    Packs/day: 0.25    Years: 15.00    Pack years: 3.75    Types: Cigarettes  . Smokeless tobacco: Never Used  Substance and Sexual Activity  . Alcohol use: Yes    Comment: 3-5 glasses wine/month  . Drug use: Never  . Sexual activity: Not on file  Lifestyle  . Physical activity:    Days per week: Not on file    Minutes per session: Not on file  . Stress: Not on file  Relationships  . Social connections:    Talks on phone: Not on file    Gets together: Not on file    Attends religious service: Not on file    Active member of club or organization: Not on file    Attends meetings of clubs or organizations: Not on file    Relationship status: Not on file  . Intimate partner violence:    Fear of current or ex partner: Not on file    Emotionally abused: Not on file    Physically abused: Not on file    Forced sexual activity: Not on file  Other Topics Concern  . Not on file  Social History Narrative  . Not on file      Review of Systems  Constitutional: Negative for activity change and fatigue.  HENT: Positive for ear pain and sinus pressure. Negative for congestion and postnasal drip.        Intermittent.   Eyes: Negative.   Respiratory: Negative for cough and shortness of breath.   Cardiovascular: Negative for chest pain and palpitations.       Blood  pressure improved today.  Gastrointestinal: Negative for abdominal distention, abdominal pain, constipation, nausea and vomiting.       Improving GERD.  Endocrine: Negative for cold intolerance, heat intolerance, polydipsia and polyuria.       Elevated blood sugars past few months.   Genitourinary: Negative.   Musculoskeletal: Positive for arthralgias and back pain. Negative for myalgias.       Left heel pain which radiates up the left leg and into the left hip. Intermittent lower back pain.   Skin: Negative for rash.  Allergic/Immunologic: Positive for environmental allergies.  Neurological: Positive for headaches. Negative for dizziness.  Hematological: Negative for  adenopathy.  Psychiatric/Behavioral: Negative for behavioral problems and dysphoric mood. The patient is not nervous/anxious.     Vital Signs: BP 132/78 (BP Location: Left Arm, Patient Position: Sitting, Cuff Size: Large)   Pulse 71   Resp 16   Ht 5\' 7"  (1.702 m)   Wt 231 lb 9.6 oz (105.1 kg)   LMP  (LMP Unknown)   SpO2 96%   BMI 36.27 kg/m    Physical Exam Vitals signs and nursing note reviewed.  Constitutional:      General: She is not in acute distress.    Appearance: Normal appearance. She is well-developed. She is not diaphoretic.  HENT:     Head: Normocephalic and atraumatic.     Right Ear: Ear canal and external ear normal.     Left Ear: Ear canal and external ear normal.     Nose: Nose normal.     Mouth/Throat:     Pharynx: No oropharyngeal exudate.  Eyes:     Extraocular Movements: Extraocular movements intact.     Conjunctiva/sclera: Conjunctivae normal.     Pupils: Pupils are equal, round, and reactive to light.  Neck:     Musculoskeletal: Normal range of motion and neck supple.     Thyroid: No thyromegaly.     Vascular: No JVD.     Trachea: No tracheal deviation.  Cardiovascular:     Rate and Rhythm: Normal rate and regular rhythm.     Heart sounds: Normal heart sounds. No murmur. No  friction rub. No gallop.   Pulmonary:     Effort: Pulmonary effort is normal. No respiratory distress.     Breath sounds: Normal breath sounds. No wheezing or rales.  Chest:     Chest wall: No tenderness.  Abdominal:     General: Bowel sounds are normal.     Palpations: Abdomen is soft.     Tenderness: There is no abdominal tenderness. Negative signs include Murphy's sign and McBurney's sign.  Musculoskeletal: Normal range of motion.     Comments: Tenderness with deep palpation of the left heel. No bony abnormality or deformity present. No swelling or redness appreciated.   Lymphadenopathy:     Cervical: No cervical adenopathy.  Skin:    General: Skin is warm and dry.  Neurological:     General: No focal deficit present.     Mental Status: She is alert and oriented to person, place, and time.     Cranial Nerves: No cranial nerve deficit.  Psychiatric:        Behavior: Behavior normal.        Thought Content: Thought content normal.        Judgment: Judgment normal.   Assessment/Plan:  1. Type 2 diabetes mellitus without complication, without long-term current use of insulin (HCC) - POCT HgB A1C 7.1 today. Advised patient to limit carbohydrates and sugar in her diet. Try to imcorporate exercise into daily routine. Closely monitor blood sugars. Consider adding metformin if no improvement at next visit.   2. Essential hypertension Stable. Continue bp medication as prescribed   3. Inflammatory pain of left heel Use ibuprofen as needed and as prescribed. Refer to podiatry for further evaluation and treatment.  - Ambulatory referral to Podiatry  4. Primary generalized (osteo)arthritis Use ibuprofen 400mg  twice daily as needed for pain/inflammation. Stay active.  - ibuprofen (ADVIL,MOTRIN) 400 MG tablet; Take 1 tablet (400 mg total) by mouth 2 (two) times daily with a meal.  Dispense: 60 tablet; Refill: 2  5.  Gastroesophageal reflux disease without esophagitis Reviewed results of  endoscopy. Follow up with GI as scheduled.   General Counseling: khila papp understanding of the findings of todays visit and agrees with plan of treatment. I have discussed any further diagnostic evaluation that may be needed or ordered today. We also reviewed her medications today. she has been encouraged to call the office with any questions or concerns that should arise related to todays visit.  Diabetes Counseling:  1. Addition of ACE inh/ ARB'S for nephroprotection. Microalbumin is updated  2. Diabetic foot care, prevention of complications. Podiatry consult 3. Exercise and lose weight.  4. Diabetic eye examination, Diabetic eye exam is updated  5. Monitor blood sugar closlely. nutrition counseling.  6. Sign and symptoms of hypoglycemia including shaking sweating,confusion and headaches.  This patient was seen by Leretha Pol FNP Collaboration with Dr Lavera Guise as a part of collaborative care agreement  Orders Placed This Encounter  Procedures  . Ambulatory referral to Podiatry  . POCT HgB A1C    Meds ordered this encounter  Medications  . ibuprofen (ADVIL,MOTRIN) 400 MG tablet    Sig: Take 1 tablet (400 mg total) by mouth 2 (two) times daily with a meal.    Dispense:  60 tablet    Refill:  2    Order Specific Question:   Supervising Provider    Answer:   Lavera Guise [[id number]]    Time spent: 68 Minutes      Dr Lavera Guise Internal medicine

## 2018-06-01 ENCOUNTER — Other Ambulatory Visit: Payer: Self-pay

## 2018-06-01 DIAGNOSIS — K2100 Gastro-esophageal reflux disease with esophagitis, without bleeding: Secondary | ICD-10-CM

## 2018-06-01 MED ORDER — PANTOPRAZOLE SODIUM 40 MG PO TBEC: DELAYED_RELEASE_TABLET | ORAL | 5 refills | Status: DC

## 2018-06-07 DIAGNOSIS — K210 Gastro-esophageal reflux disease with esophagitis: Secondary | ICD-10-CM | POA: Diagnosis not present

## 2018-06-09 ENCOUNTER — Encounter: Payer: Self-pay | Admitting: Podiatry

## 2018-06-09 ENCOUNTER — Ambulatory Visit: Payer: PPO | Admitting: Podiatry

## 2018-06-09 ENCOUNTER — Ambulatory Visit (INDEPENDENT_AMBULATORY_CARE_PROVIDER_SITE_OTHER): Payer: PPO

## 2018-06-09 DIAGNOSIS — M722 Plantar fascial fibromatosis: Secondary | ICD-10-CM | POA: Diagnosis not present

## 2018-06-09 MED ORDER — METHYLPREDNISOLONE 4 MG PO TBPK: ORAL_TABLET | ORAL | 0 refills | Status: DC

## 2018-06-09 MED ORDER — MELOXICAM 15 MG PO TABS: 15.0000 mg | ORAL_TABLET | Freq: Every day | ORAL | 3 refills | Status: DC

## 2018-06-09 NOTE — Progress Notes (Signed)
Subjective:  Patient ID: Holly Forbes, female    DOB: 08/10/1950,  MRN: [id number] HPI Chief Complaint  Patient presents with  . Foot Pain    Patient presents today for bilat heel pain off and on x 1 year.  She reports the pain is mostly in her heels but also hurts throughout her feet.  She states "the pains are sharp when walking at times and my arches cramp sometimes"  She has been using inserts, diffrent shoes, stretching and Ibuprofen which helps at times.    68 y.o. female presents with the above complaint.   ROS: Denies fever chills nausea vomiting muscle aches pains calf pain back pain chest pain shortness of breath.  Past Medical History:  Diagnosis Date  . Abdominal pain   . 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 Back pain    Left sciatica, low back pain  . GERD (gastroesophageal reflux disease)   . Glaucoma   . Hypertension   . Vitamin D deficiency    Past Surgical History:  Procedure Laterality Date  . ABDOMINAL HYSTERECTOMY    . BREAST EXCISIONAL BIOPSY Left 2015   cyst removed  . CHOLECYSTECTOMY    . COLONOSCOPY N/A 09/25/2014   Procedure: COLONOSCOPY;  Surgeon: Lucilla Lame, MD;  Location: [facility];  Service: Gastroenterology;  Laterality: N/A;  cecum time- 4696  . CYST REMOVAL NECK    . ESOPHAGOGASTRODUODENOSCOPY N/A 09/25/2014   Procedure: ESOPHAGOGASTRODUODENOSCOPY (EGD);  Surgeon: Lucilla Lame, MD;  Location: [place];  Service: Gastroenterology;  Laterality: N/A;  . ESOPHAGOGASTRODUODENOSCOPY (EGD) WITH PROPOFOL N/A 04/07/2018   Procedure: ESOPHAGOGASTRODUODENOSCOPY (EGD) WITH PROPOFOL;  Surgeon: Toledo, Benay Pike, MD;  Location: [facility];  Service: Gastroenterology;  Laterality: N/A;  . POLYPECTOMY  09/25/2014   Procedure: POLYPECTOMY INTESTINAL;  Surgeon: Lucilla Lame, MD;  Location: [place];  Service: Gastroenterology;;    Current Outpatient Medications:  .  aspirin 81 MG tablet, Take 81 mg by mouth daily.  am, Disp: , Rfl:  .  dorzolamide-timolol (COSOPT) 22.3-6.8 MG/ML ophthalmic solution, INSTILL 1 DROP INTO BOTH EYES QHS, Disp: , Rfl: 5 .  hydrochlorothiazide (HYDRODIURIL) 12.5 MG tablet, Take 1 tablet (12.5 mg total) by mouth daily., Disp: 30 tablet, Rfl: 5 .  ibuprofen (ADVIL,MOTRIN) 400 MG tablet, Take 1 tablet (400 mg total) by mouth 2 (two) times daily with a meal., Disp: 60 tablet, Rfl: 2 .  latanoprost (XALATAN) 0.005 % ophthalmic solution, Place 1 drop into both eyes. , Disp: , Rfl: 3 .  lisinopril (PRINIVIL,ZESTRIL) 20 MG tablet, Take 1 tablet (20 mg total) by mouth daily. am, Disp: 30 tablet, Rfl: 6 .  meloxicam (MOBIC) 15 MG tablet, Take 1 tablet (15 mg total) by mouth daily., Disp: 30 tablet, Rfl: 3 .  methylPREDNISolone (MEDROL DOSEPAK) 4 MG TBPK tablet, 6 day dose pack - take as directed, Disp: 21 tablet, Rfl: 0 .  Multiple Vitamin (MULTIVITAMIN) tablet, Take 1 tablet by mouth daily. Doesn't take everyday, Disp: , Rfl:  .  pantoprazole (PROTONIX) 40 MG tablet, TAKE 1 TABLET(40 MG) BY MOUTH DAILY, Disp: 30 tablet, Rfl: 5  Current Facility-Administered Medications:  .  triamcinolone acetonide (KENALOG) 10 MG/ML injection 10 mg, 10 mg, Other, Once, Landis Martins, DPM  Allergies  Allergen Reactions  . Other    Review of Systems Objective:  There were no vitals filed for this visit.  General: Well developed, nourished, in no acute distress, alert and oriented x3   Dermatological:  Skin is warm, dry and supple bilateral. Nails x 10 are well maintained; remaining integument appears unremarkable at this time. There are no open sores, no preulcerative lesions, no rash or signs of infection present.  Vascular: Dorsalis Pedis artery and Posterior Tibial artery pedal pulses are 2/4 bilateral with immedate capillary fill time. Pedal hair growth present. No varicosities and no lower extremity edema present bilateral.   Neruologic: Grossly intact via light touch bilateral. Vibratory  intact via tuning fork bilateral. Protective threshold with Semmes Wienstein monofilament intact to all pedal sites bilateral. Patellar and Achilles deep tendon reflexes 2+ bilateral. No Babinski or clonus noted bilateral.   Musculoskeletal: No gross boney pedal deformities bilateral. No pain, crepitus, or limitation noted with foot and ankle range of motion bilateral. Muscular strength 5/5 in all groups tested bilateral.  Pain on palpation medial calcaneal tubercle left heel over the right.  Gait: Unassisted, Nonantalgic.    Radiographs:  Radiographs taken today demonstrate a small plantar distally oriented calcaneal heel spur soft tissue increase in density plantar calcaneal insertion site.  Other chronic findings would be pes planus.  Assessment & Plan:   Assessment: Plantar fasciitis left greater than right.  Plan: Discussed etiology pathology conservative versus surgical therapies.  At this point injected her left heel with 20 mg Kenalog 5 mg Marcaine point maximal tenderness left.  Placed her in a plantar fascial brace and a night splint.  Discussed appropriate shoe gear stretching exercise ice therapy sugar modifications. Follow-up with her in 1 month.     Holly Forbes, [place]

## 2018-06-09 NOTE — Patient Instructions (Signed)

## 2018-07-05 ENCOUNTER — Encounter: Payer: Self-pay | Admitting: Podiatry

## 2018-07-05 ENCOUNTER — Ambulatory Visit: Payer: PPO | Admitting: Podiatry

## 2018-07-05 DIAGNOSIS — M722 Plantar fascial fibromatosis: Secondary | ICD-10-CM

## 2018-07-05 NOTE — Progress Notes (Signed)
She presents today for follow-up she states that is doing so much better have not needed to take any of the meloxicam.  Objective: She still has some tenderness on palpation medial calcaneal tubercle of the left heel.  Otherwise pulses are palpable.  No edema erythema cellulitis drainage odor no open lesions or wounds.  Assessment: Residual plantar fasciitis however 95% resolved.  Plan: Recommended that she continue meloxicam and braces.  Follow-up with me if this does not resolve orthotics will be next.

## 2018-07-27 DIAGNOSIS — H401131 Primary open-angle glaucoma, bilateral, mild stage: Secondary | ICD-10-CM | POA: Diagnosis not present

## 2018-08-16 ENCOUNTER — Other Ambulatory Visit: Payer: Self-pay

## 2018-08-16 ENCOUNTER — Encounter: Payer: Self-pay | Admitting: Nurse Practitioner

## 2018-08-16 ENCOUNTER — Ambulatory Visit (INDEPENDENT_AMBULATORY_CARE_PROVIDER_SITE_OTHER): Payer: PPO | Admitting: Nurse Practitioner

## 2018-08-16 VITALS — BP 147/82 | HR 60 | Resp 16 | Ht 67.0 in | Wt 228.0 lb

## 2018-08-16 DIAGNOSIS — K219 Gastro-esophageal reflux disease without esophagitis: Secondary | ICD-10-CM

## 2018-08-16 DIAGNOSIS — M79672 Pain in left foot: Secondary | ICD-10-CM

## 2018-08-16 DIAGNOSIS — I10 Essential (primary) hypertension: Secondary | ICD-10-CM

## 2018-08-16 DIAGNOSIS — E119 Type 2 diabetes mellitus without complications: Secondary | ICD-10-CM | POA: Diagnosis not present

## 2018-08-16 LAB — POCT GLYCOSYLATED HEMOGLOBIN (HGB A1C): Hemoglobin A1C: 6.6 % — AB (ref 4.0–5.6)

## 2018-08-16 NOTE — Progress Notes (Signed)
Pt blood pressure is elevated,  Informed provider. Pt stated that her blood pressure is elevated every time she comes in.

## 2018-08-16 NOTE — Progress Notes (Signed)
[facility] [place], [place] [address]  Internal MEDICINE  Office Visit Note  Patient Name: Holly Forbes  [id number]  [id number]  Date of Service: 08/16/2018  Chief Complaint  Patient presents with  . Medical Management of Chronic Issues  . Hypertension    The patient has hada great deal of personal stress. Her sister passed away and then her sister's son had some significant health problems which she had to deal with. She has lost three pounds since her last visit. Her blood pressure is generally well controlled. Does not check her blood sugars at home. HgbA1c was elevated at her last visit. She has been working on this as much as possible in order to avoid starting on medication for sugars.      Current Medication: Outpatient Encounter Medications as of 08/16/2018  Medication Sig  . aspirin 81 MG tablet Take 81 mg by mouth daily. am  . dorzolamide-timolol (COSOPT) 22.3-6.8 MG/ML ophthalmic solution INSTILL 1 DROP INTO BOTH EYES QHS  . hydrochlorothiazide (HYDRODIURIL) 12.5 MG tablet Take 1 tablet (12.5 mg total) by mouth daily.  Marland Kitchen ibuprofen (ADVIL,MOTRIN) 400 MG tablet Take 1 tablet (400 mg total) by mouth 2 (two) times daily with a meal.  . latanoprost (XALATAN) 0.005 % ophthalmic solution Place 1 drop into both eyes.   Marland Kitchen lisinopril (PRINIVIL,ZESTRIL) 20 MG tablet Take 1 tablet (20 mg total) by mouth daily. am  . meloxicam (MOBIC) 15 MG tablet Take 1 tablet (15 mg total) by mouth daily.  . Multiple Vitamin (MULTIVITAMIN) tablet Take 1 tablet by mouth daily. Doesn't take everyday  . pantoprazole (PROTONIX) 20 MG tablet    Facility-Administered Encounter Medications as of 08/16/2018  Medication  . triamcinolone acetonide (KENALOG) 10 MG/ML injection 10 mg    Surgical History: Past Surgical History:  Procedure Laterality Date  . ABDOMINAL HYSTERECTOMY    . BREAST EXCISIONAL BIOPSY Left 2015   cyst removed  . CHOLECYSTECTOMY    .  COLONOSCOPY N/A 09/25/2014   Procedure: COLONOSCOPY;  Surgeon: Lucilla Lame, MD;  Location: [place];  Service: Gastroenterology;  Laterality: N/A;  cecum time- 7106  . CYST REMOVAL NECK    . ESOPHAGOGASTRODUODENOSCOPY N/A 09/25/2014   Procedure: ESOPHAGOGASTRODUODENOSCOPY (EGD);  Surgeon: Lucilla Lame, MD;  Location: [place];  Service: Gastroenterology;  Laterality: N/A;  . ESOPHAGOGASTRODUODENOSCOPY (EGD) WITH PROPOFOL N/A 04/07/2018   Procedure: ESOPHAGOGASTRODUODENOSCOPY (EGD) WITH PROPOFOL;  Surgeon: Toledo, Benay Pike, MD;  Location: [facility];  Service: Gastroenterology;  Laterality: N/A;  . POLYPECTOMY  09/25/2014   Procedure: POLYPECTOMY INTESTINAL;  Surgeon: Lucilla Lame, MD;  Location: [place];  Service: Gastroenterology;;    Medical History: Past Medical History:  Diagnosis Date  . Abdominal pain   . 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 Back pain    Left sciatica, low back pain  . GERD (gastroesophageal reflux disease)   . Glaucoma   . Hypertension   . Vitamin D deficiency     Family History: Family History  Problem Relation Age of Onset  . Heart disease Sister   . Diabetes Sister   . Hypertension Sister   . Heart disease Brother   . Diabetes Brother   . Hypertension Brother   . Breast cancer Daughter 38  . Colon cancer Mother   . Stroke Mother   . Lung cancer Father     Social History   Socioeconomic History  . Marital status: Married  Spouse name: Not on file  . Number of children: Not on file  . Years of education: Not on file  . Highest education level: Not on file  Occupational History  . Not on file  Social Needs  . Financial resource strain: Not on file  . Food insecurity:    Worry: Not on file    Inability: Not on file  . Transportation needs:    Medical: Not on file    Non-medical: Not on file  Tobacco Use  . Smoking status: Former Smoker    Packs/day: 0.25    Years: 15.00    Pack years: 3.75     Types: Cigarettes  . Smokeless tobacco: Never Used  Substance and Sexual Activity  . Alcohol use: Yes    Comment: 3-5 glasses wine/month  . Drug use: Never  . Sexual activity: Not on file  Lifestyle  . Physical activity:    Days per week: Not on file    Minutes per session: Not on file  . Stress: Not on file  Relationships  . Social connections:    Talks on phone: Not on file    Gets together: Not on file    Attends religious service: Not on file    Active member of club or organization: Not on file    Attends meetings of clubs or organizations: Not on file    Relationship status: Not on file  . Intimate partner violence:    Fear of current or ex partner: Not on file    Emotionally abused: Not on file    Physically abused: Not on file    Forced sexual activity: Not on file  Other Topics Concern  . Not on file  Social History Narrative  . Not on file      Review of Systems  Constitutional: Negative for activity change and fatigue.  HENT: Negative for congestion, ear pain, postnasal drip and sinus pressure.        Intermittent.   Respiratory: Negative for cough, shortness of breath and wheezing.   Cardiovascular: Negative for chest pain and palpitations.       Slightly elevated blood pressure today.  Gastrointestinal: Negative for abdominal distention, abdominal pain, constipation, nausea and vomiting.       Now taking protonix 20mg  daily. Using pepcid as needed. She states she took the pepcid for a little while, but stopped because she couldn't tell if this was working .  Endocrine: Negative for cold intolerance, heat intolerance, polydipsia and polyuria.       Elevated blood sugars past few months.   Musculoskeletal: Negative for arthralgias, back pain and myalgias.  Skin: Negative for rash.  Allergic/Immunologic: Positive for environmental allergies.  Neurological: Positive for headaches. Negative for dizziness.  Hematological: Negative for adenopathy.   Psychiatric/Behavioral: Negative for behavioral problems and dysphoric mood. The patient is nervous/anxious.     Today's Vitals   08/16/18 1040  BP: (!) 147/82  Pulse: 60  Resp: 16  SpO2: 98%  Weight: 228 lb (103.4 kg)  Height: 5\' 7"  (1.702 m)   Body mass index is 35.71 kg/m.   Physical Exam Vitals signs and nursing note reviewed.  Constitutional:      General: She is not in acute distress.    Appearance: Normal appearance. She is well-developed. She is not diaphoretic.  HENT:     Head: Normocephalic and atraumatic.     Mouth/Throat:     Pharynx: No oropharyngeal exudate.  Eyes:     Conjunctiva/sclera:  Conjunctivae normal.     Pupils: Pupils are equal, round, and reactive to light.  Neck:     Musculoskeletal: Normal range of motion and neck supple.     Thyroid: No thyromegaly.     Vascular: No carotid bruit or JVD.     Trachea: No tracheal deviation.  Cardiovascular:     Rate and Rhythm: Normal rate and regular rhythm.     Heart sounds: Normal heart sounds. No murmur. No friction rub. No gallop.   Pulmonary:     Effort: Pulmonary effort is normal. No respiratory distress.     Breath sounds: Normal breath sounds. No wheezing or rales.  Chest:     Chest wall: No tenderness.  Abdominal:     General: Bowel sounds are normal.     Palpations: Abdomen is soft.     Tenderness: There is no abdominal tenderness. Negative signs include Murphy's sign and McBurney's sign.  Musculoskeletal: Normal range of motion.  Lymphadenopathy:     Cervical: No cervical adenopathy.  Skin:    General: Skin is warm and dry.  Neurological:     General: No focal deficit present.     Mental Status: She is alert and oriented to person, place, and time.     Cranial Nerves: No cranial nerve deficit.  Psychiatric:        Behavior: Behavior normal.        Thought Content: Thought content normal.        Judgment: Judgment normal.   Assessment/Plan: 1. Type 2 diabetes mellitus without  complication, without long-term current use of insulin (HCC) - POCT HgB A1C 6.6 today. Much improved through diet and exercise. Continue to follow ADA diet and incorporate exercise into daily routine. Recheck HgbA1c at next visit.   2. Essential hypertension Stable. Continue BP medication as prescribed   3. Gastroesophageal reflux disease without esophagitis Take protonix 20mg  daily as needed and pepcid when needed. If continues, recommend she follow up with GI provider.   4. Inflammatory pain of left heel Continue meloxicam daily as prescribed per podiatry.   General Counseling: Holly Forbes understanding of the findings of todays visit and agrees with plan of treatment. I have discussed any further diagnostic evaluation that may be needed or ordered today. We also reviewed her medications today. she has been encouraged to call the office with any questions or concerns that should arise related to todays visit.  Diabetes Counseling:  1. Addition of ACE inh/ ARB'S for nephroprotection. Microalbumin is updated  2. Diabetic foot care, prevention of complications. Podiatry consult 3. Exercise and lose weight.  4. Diabetic eye examination, Diabetic eye exam is updated  5. Monitor blood sugar closlely. nutrition counseling.  6. Sign and symptoms of hypoglycemia including shaking sweating,confusion and headaches.  This patient was seen by Leretha Pol FNP Collaboration with Dr Lavera Guise as a part of collaborative care agreement  Orders Placed This Encounter  Procedures  . POCT HgB A1C      Time spent: 25 Minutes      Dr Lavera Guise Internal medicine

## 2018-11-10 ENCOUNTER — Other Ambulatory Visit: Payer: Self-pay

## 2018-11-10 DIAGNOSIS — I10 Essential (primary) hypertension: Secondary | ICD-10-CM

## 2018-11-10 MED ORDER — LISINOPRIL 20 MG PO TABS: 20.0000 mg | ORAL_TABLET | Freq: Every day | ORAL | 1 refills | Status: DC

## 2018-11-10 MED ORDER — HYDROCHLOROTHIAZIDE 12.5 MG PO TABS: 12.5000 mg | ORAL_TABLET | Freq: Every day | ORAL | 5 refills | Status: DC

## 2018-11-11 ENCOUNTER — Other Ambulatory Visit: Payer: Self-pay

## 2018-11-11 MED ORDER — MELOXICAM 15 MG PO TABS: 15.0000 mg | ORAL_TABLET | Freq: Every day | ORAL | 3 refills | Status: DC

## 2018-11-11 NOTE — Telephone Encounter (Signed)
Pharmacy refill request for Meloxicam 15mg . Per Dr. Stephenie Acres verbal order, ok to refill.   Script has been sent to pharmacy

## 2018-11-29 ENCOUNTER — Ambulatory Visit: Payer: PPO | Admitting: Nurse Practitioner

## 2018-12-03 ENCOUNTER — Ambulatory Visit: Payer: PPO | Admitting: Nurse Practitioner

## 2019-01-03 ENCOUNTER — Other Ambulatory Visit: Payer: Self-pay | Admitting: Nurse Practitioner

## 2019-01-03 DIAGNOSIS — M150 Primary generalized (osteo)arthritis: Secondary | ICD-10-CM

## 2019-01-03 MED ORDER — IBUPROFEN 400 MG PO TABS: 400.0000 mg | ORAL_TABLET | Freq: Two times a day (BID) | ORAL | 2 refills | Status: DC

## 2019-01-25 DIAGNOSIS — H401131 Primary open-angle glaucoma, bilateral, mild stage: Secondary | ICD-10-CM | POA: Diagnosis not present

## 2019-01-28 ENCOUNTER — Ambulatory Visit (INDEPENDENT_AMBULATORY_CARE_PROVIDER_SITE_OTHER): Payer: PPO | Admitting: Nurse Practitioner

## 2019-01-28 ENCOUNTER — Encounter: Payer: Self-pay | Admitting: Nurse Practitioner

## 2019-01-28 ENCOUNTER — Other Ambulatory Visit: Payer: Self-pay

## 2019-01-28 VITALS — BP 157/78 | HR 64 | Resp 16 | Ht 67.0 in | Wt 230.0 lb

## 2019-01-28 DIAGNOSIS — E119 Type 2 diabetes mellitus without complications: Secondary | ICD-10-CM

## 2019-01-28 DIAGNOSIS — Z0001 Encounter for general adult medical examination with abnormal findings: Secondary | ICD-10-CM

## 2019-01-28 DIAGNOSIS — I10 Essential (primary) hypertension: Secondary | ICD-10-CM

## 2019-01-28 DIAGNOSIS — Z1239 Encounter for other screening for malignant neoplasm of breast: Secondary | ICD-10-CM

## 2019-01-28 DIAGNOSIS — R300 Dysuria: Secondary | ICD-10-CM

## 2019-01-28 DIAGNOSIS — J301 Allergic rhinitis due to pollen: Secondary | ICD-10-CM

## 2019-01-28 LAB — POCT GLYCOSYLATED HEMOGLOBIN (HGB A1C): Hemoglobin A1C: 6.9 % — AB (ref 4.0–5.6)

## 2019-01-28 NOTE — Progress Notes (Signed)
[facility] [place], [place] [address]  Internal MEDICINE  Office Visit Note  Patient Name: Holly Forbes  [id number]  [id number]  Date of Service: 01/28/2019   Pt is here for routine health maintenance examination  Chief Complaint  Patient presents with  . Annual Exam  . Hypertension  . Gastroesophageal Reflux  . Quality Metric Gaps    diabetic foot exam and pna vacc   . Sinus Problem    drainage     The patient is here for health maintenance exam. Today, she is complaining about sinus pain and pressure. Has a great deal of post nasal drip. She denies headache, fever, or chills. Believes she may have allergies to ragweed or grass. The patient is diabetic and controls her blood sugars through diet and exercise. Her HgbA1c is 6.9 today.  Blood pressure is mildly elevated. She states that she takes her blood pressure medications every day. She feels good. Denies headache or dizziness. She does need to have routine labs done and will be due for mammogram in beginning of November.     Current Medication: Outpatient Encounter Medications as of 01/28/2019  Medication Sig  . aspirin 81 MG tablet Take 81 mg by mouth daily. am  . dorzolamide-timolol (COSOPT) 22.3-6.8 MG/ML ophthalmic solution INSTILL 1 DROP INTO BOTH EYES QHS  . hydrochlorothiazide (HYDRODIURIL) 12.5 MG tablet Take 1 tablet (12.5 mg total) by mouth daily.  Marland Kitchen latanoprost (XALATAN) 0.005 % ophthalmic solution Place 1 drop into both eyes.   Marland Kitchen lisinopril (ZESTRIL) 20 MG tablet Take 1 tablet (20 mg total) by mouth daily. am  . meloxicam (MOBIC) 15 MG tablet Take 1 tablet (15 mg total) by mouth daily.  . pantoprazole (PROTONIX) 20 MG tablet Take 20 mg by mouth daily.   Marland Kitchen VITAMIN D PO Take by mouth daily.  Marland Kitchen VITAMIN E PO Take by mouth daily.  . [DISCONTINUED] ibuprofen (ADVIL) 400 MG tablet Take 1 tablet (400 mg total) by mouth 2 (two) times daily with a meal. (Patient not taking: Reported  on 01/28/2019)  . [DISCONTINUED] Multiple Vitamin (MULTIVITAMIN) tablet Take 1 tablet by mouth daily. Doesn't take everyday   Facility-Administered Encounter Medications as of 01/28/2019  Medication  . triamcinolone acetonide (KENALOG) 10 MG/ML injection 10 mg    Surgical History: Past Surgical History:  Procedure Laterality Date  . ABDOMINAL HYSTERECTOMY    . BREAST EXCISIONAL BIOPSY Left 2015   cyst removed  . CHOLECYSTECTOMY    . COLONOSCOPY N/A 09/25/2014   Procedure: COLONOSCOPY;  Surgeon: Lucilla Lame, MD;  Location: [place];  Service: Gastroenterology;  Laterality: N/A;  cecum [id number]  . CYST REMOVAL NECK    . ESOPHAGOGASTRODUODENOSCOPY N/A 09/25/2014   Procedure: ESOPHAGOGASTRODUODENOSCOPY (EGD);  Surgeon: Lucilla Lame, MD;  Location: [place];  Service: Gastroenterology;  Laterality: N/A;  . ESOPHAGOGASTRODUODENOSCOPY (EGD) WITH PROPOFOL N/A 04/07/2018   Procedure: ESOPHAGOGASTRODUODENOSCOPY (EGD) WITH PROPOFOL;  Surgeon: Toledo, Benay Pike, MD;  Location: [facility];  Service: Gastroenterology;  Laterality: N/A;  . POLYPECTOMY  09/25/2014   Procedure: POLYPECTOMY INTESTINAL;  Surgeon: Lucilla Lame, MD;  Location: [place];  Service: Gastroenterology;;    Medical History: Past Medical History:  Diagnosis Date  . Abdominal pain   . 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 Back pain    Left sciatica, low back pain  . GERD (gastroesophageal reflux disease)   . Glaucoma   . Hypertension   . Vitamin D  deficiency     Family History: Family History  Problem Relation Age of Onset  . Heart disease Sister   . Diabetes Sister   . Hypertension Sister   . Heart disease Brother   . Diabetes Brother   . Hypertension Brother   . Breast cancer Daughter 38  . Colon cancer Mother   . Stroke Mother   . Lung cancer Father       Review of Systems  Constitutional: Negative for activity change and fatigue.  HENT: Positive for postnasal drip.  Negative for congestion, ear pain and sinus pressure.        Intermittent.   Respiratory: Negative for cough, shortness of breath and wheezing.   Cardiovascular: Negative for chest pain and palpitations.       Slightly elevated blood pressure today.  Gastrointestinal: Negative for abdominal distention, abdominal pain, constipation, nausea and vomiting.       Now taking protonix 20mg  daily. Using pepcid as needed. She states she took the pepcid for a little while, but stopped because she couldn't tell if this was working .  Endocrine: Negative for cold intolerance, heat intolerance, polydipsia and polyuria.       Elevated blood sugars past few months.   Musculoskeletal: Negative for arthralgias, back pain and myalgias.  Skin: Negative for rash.  Allergic/Immunologic: Positive for environmental allergies.  Neurological: Positive for headaches. Negative for dizziness.  Hematological: Negative for adenopathy.  Psychiatric/Behavioral: Negative for behavioral problems and dysphoric mood. The patient is nervous/anxious.      Today's Vitals   01/28/19 0911  BP: (!) 157/78  Pulse: 64  Resp: 16  SpO2: 97%  Weight: 230 lb (104.3 kg)  Height: 5\' 7"  (1.702 m)   Body mass index is 36.02 kg/m.  Physical Exam Vitals signs and nursing note reviewed.  Constitutional:      General: She is not in acute distress.    Appearance: Normal appearance. She is well-developed. She is not diaphoretic.  HENT:     Head: Normocephalic and atraumatic.     Nose: Nose normal.     Mouth/Throat:     Pharynx: No oropharyngeal exudate.  Eyes:     Pupils: Pupils are equal, round, and reactive to light.  Neck:     Musculoskeletal: Normal range of motion and neck supple.     Thyroid: No thyromegaly.     Vascular: No carotid bruit or JVD.     Trachea: No tracheal deviation.  Cardiovascular:     Rate and Rhythm: Normal rate and regular rhythm.     Pulses: Normal pulses.          Dorsalis pedis pulses are 2+ on  the right side and 2+ on the left side.       Posterior tibial pulses are 2+ on the right side and 2+ on the left side.     Heart sounds: Normal heart sounds. No murmur. No friction rub. No gallop.   Pulmonary:     Effort: Pulmonary effort is normal. No respiratory distress.     Breath sounds: Normal breath sounds. No wheezing or rales.  Chest:     Chest wall: No tenderness.     Breasts:        Right: Normal. No swelling, bleeding, inverted nipple, mass, nipple discharge, skin change or tenderness.        Left: Normal. No swelling, bleeding, inverted nipple, mass, nipple discharge, skin change or tenderness.  Abdominal:     General: Bowel sounds are  normal.     Palpations: Abdomen is soft.     Tenderness: There is no abdominal tenderness.  Musculoskeletal: Normal range of motion.     Right foot: Normal range of motion. No deformity.     Left foot: Normal range of motion. No deformity or bunion.  Feet:     Right foot:     Protective Sensation: 10 sites tested. 10 sites sensed.     Skin integrity: Skin integrity normal. No skin breakdown.     Toenail Condition: Right toenails are normal.     Left foot:     Protective Sensation: 10 sites tested. 10 sites sensed.     Skin integrity: Skin integrity normal. No skin breakdown.     Toenail Condition: Left toenails are normal.  Lymphadenopathy:     Cervical: No cervical adenopathy.  Skin:    General: Skin is warm and dry.  Neurological:     Mental Status: She is alert and oriented to person, place, and time. Mental status is at baseline.     Cranial Nerves: No cranial nerve deficit.  Psychiatric:        Behavior: Behavior normal.        Thought Content: Thought content normal.        Judgment: Judgment normal.    Depression screen [facility] 2/9 01/28/2019 08/16/2018 05/17/2018 01/22/2018 10/19/2017  Decreased Interest 0 0 0 0 0  Down, Depressed, Hopeless 0 0 0 0 0  PHQ - 2 Score 0 0 0 0 0    Functional Status Survey: Is the patient deaf or  have difficulty hearing?: No Does the patient have difficulty seeing, even when wearing glasses/contacts?: No Does the patient have difficulty concentrating, remembering, or making decisions?: No Does the patient have difficulty walking or climbing stairs?: No Does the patient have difficulty dressing or bathing?: No Does the patient have difficulty doing errands alone such as visiting a doctor's office or shopping?: No  MMSE - [place] Exam 01/28/2019 01/22/2018  Orientation to time 5 5  Orientation to Place 5 5  Registration 3 3  Attention/ Calculation 5 5  Recall 3 3  Language- name 2 objects 2 2  Language- repeat 1 1  Language- follow 3 step command 3 3  Language- read & follow direction 1 1  Write a sentence 1 1  Copy design 1 1  Total score 30 30    Fall Risk  01/28/2019 08/16/2018 05/17/2018 01/22/2018 10/19/2017  Falls in the past year? 0 0 0 No No      LABS: Recent Results (from the past 2160 hour(s))  POCT HgB A1C     Status: Abnormal   Collection Time: 01/28/19  9:33 AM  Result Value Ref Range   Hemoglobin A1C 6.9 (A) 4.0 - 5.6 %   HbA1c POC (<> result, manual entry)     HbA1c, POC (prediabetic range)     HbA1c, POC (controlled diabetic range)     Assessment/Plan: 1. Encounter for general adult medical examination with abnormal findings Annual health maintenance exam today.  2. Type 2 diabetes mellitus without complication, without long-term current use of insulin (HCC) - POCT HgB A1C 6.9 today. Continue to control through diet and exercise   3. Essential hypertension Generally stable. Patient to begin monitoring her blood pressures at home at the same time every day. Will adjust medication as indicated.   4. Seasonal allergic rhinitis due to pollen Recommended OTC allergy medications as indicated.   5. Screening for  breast cancer - MM DIGITAL SCREENING BILATERAL; Future  6. Dysuria - UA/M w/rflx Culture, Routine  General Counseling: Khylin  verbalizes understanding of the findings of todays visit and agrees with plan of treatment. I have discussed any further diagnostic evaluation that may be needed or ordered today. We also reviewed her medications today. she has been encouraged to call the office with any questions or concerns that should arise related to todays visit.    Counseling:  Hypertension Counseling:   The following hypertensive lifestyle modification were recommended and discussed:  1. Limiting alcohol intake to less than 1 oz/day of ethanol:(24 oz of beer or 8 oz of wine or 2 oz of 100-proof whiskey). 2. Take baby ASA 81 mg daily. 3. Importance of regular aerobic exercise and losing weight. 4. Reduce dietary saturated fat and cholesterol intake for overall cardiovascular health. 5. Maintaining adequate dietary potassium, calcium, and magnesium intake. 6. Regular monitoring of the blood pressure. 7. Reduce sodium intake to less than 100 mmol/day (less than 2.3 gm of sodium or less than 6 gm of sodium choride)   This patient was seen by [place] with Dr Lavera Guise as a part of collaborative care agreement  Orders Placed This Encounter  Procedures  . MM DIGITAL SCREENING BILATERAL  . UA/M w/rflx Culture, Routine  . POCT HgB A1C     Time spent: [place], MD  Internal Medicine

## 2019-01-29 LAB — UA/M W/RFLX CULTURE, ROUTINE
Bilirubin, UA: NEGATIVE
Glucose, UA: NEGATIVE
Ketones, UA: NEGATIVE
Leukocytes,UA: NEGATIVE
Nitrite, UA: NEGATIVE
Protein,UA: NEGATIVE
RBC, UA: NEGATIVE
Specific Gravity, UA: 1.015 (ref 1.005–1.030)
Urobilinogen, Ur: 0.2 mg/dL (ref 0.2–1.0)
pH, UA: 6 (ref 5.0–7.5)

## 2019-01-29 LAB — MICROSCOPIC EXAMINATION
Casts: NONE SEEN /lpf
Epithelial Cells (non renal): >10 /hpf — AB (ref 0–10)

## 2019-02-04 DIAGNOSIS — Z1231 Encounter for screening mammogram for malignant neoplasm of breast: Secondary | ICD-10-CM | POA: Diagnosis not present

## 2019-04-19 ENCOUNTER — Other Ambulatory Visit: Payer: Self-pay

## 2019-04-26 ENCOUNTER — Other Ambulatory Visit: Payer: Self-pay

## 2019-04-26 MED ORDER — IBUPROFEN 400 MG PO TABS: 400.0000 mg | ORAL_TABLET | Freq: Two times a day (BID) | ORAL | 3 refills | Status: DC | PRN

## 2019-05-06 ENCOUNTER — Ambulatory Visit: Payer: PPO | Admitting: Nurse Practitioner

## 2019-05-30 ENCOUNTER — Other Ambulatory Visit: Payer: Self-pay

## 2019-05-30 MED ORDER — LISINOPRIL 20 MG PO TABS: 20.0000 mg | ORAL_TABLET | Freq: Every day | ORAL | 1 refills | Status: DC

## 2019-06-06 ENCOUNTER — Other Ambulatory Visit: Payer: Self-pay

## 2019-06-06 DIAGNOSIS — I10 Essential (primary) hypertension: Secondary | ICD-10-CM

## 2019-06-06 MED ORDER — HYDROCHLOROTHIAZIDE 12.5 MG PO TABS: 12.5000 mg | ORAL_TABLET | Freq: Every day | ORAL | 3 refills | Status: DC

## 2019-06-07 ENCOUNTER — Ambulatory Visit: Payer: PPO | Admitting: Nurse Practitioner

## 2019-06-30 ENCOUNTER — Telehealth: Payer: Self-pay

## 2019-06-30 NOTE — Telephone Encounter (Signed)
CONFIRMED AND SCREENED FOR 07-04-19 OV 

## 2019-07-04 ENCOUNTER — Other Ambulatory Visit: Payer: Self-pay

## 2019-07-04 ENCOUNTER — Encounter: Payer: Self-pay | Admitting: Nurse Practitioner

## 2019-07-04 ENCOUNTER — Ambulatory Visit (INDEPENDENT_AMBULATORY_CARE_PROVIDER_SITE_OTHER): Payer: PPO | Admitting: Nurse Practitioner

## 2019-07-04 VITALS — BP 141/80 | HR 90 | Temp 97.4°F | Resp 16 | Ht 67.0 in | Wt 235.0 lb

## 2019-07-04 DIAGNOSIS — E1165 Type 2 diabetes mellitus with hyperglycemia: Secondary | ICD-10-CM

## 2019-07-04 DIAGNOSIS — I10 Essential (primary) hypertension: Secondary | ICD-10-CM

## 2019-07-04 DIAGNOSIS — K219 Gastro-esophageal reflux disease without esophagitis: Secondary | ICD-10-CM | POA: Diagnosis not present

## 2019-07-04 DIAGNOSIS — Z1159 Encounter for screening for other viral diseases: Secondary | ICD-10-CM | POA: Diagnosis not present

## 2019-07-04 LAB — POCT GLYCOSYLATED HEMOGLOBIN (HGB A1C): Hemoglobin A1C: 7.1 % — AB (ref 4.0–5.6)

## 2019-07-04 MED ORDER — PANTOPRAZOLE SODIUM 20 MG PO TBEC: 20.0000 mg | DELAYED_RELEASE_TABLET | Freq: Every day | ORAL | 1 refills | Status: DC

## 2019-07-04 NOTE — Progress Notes (Signed)
[facility] [place], [place] [address]  Internal MEDICINE  Office Visit Note  Patient Name: Holly Forbes  [id number]  [id number]  Date of Service: 07/06/2019  Chief Complaint  Patient presents with  . Diabetes  . Hypertension  . Gastroesophageal Reflux    The patient is here for routine follow up visit. Blood sugars are generally well controlled. HgbA1c is 7.1 today, up from 6.9 at her last visit. She has had a five pound weight gain since she was last seen. Blood pressure is well managed with current medication. She has no concerns or complaints. She did get her first [place] for COVID 19 on 07/02/2019. She is scheduled to get the second vaccine 07/30/2019.       Current Medication: Outpatient Encounter Medications as of 07/04/2019  Medication Sig  . aspirin 81 MG tablet Take 81 mg by mouth daily. am  . dorzolamide-timolol (COSOPT) 22.3-6.8 MG/ML ophthalmic solution INSTILL 1 DROP INTO BOTH EYES QHS  . hydrochlorothiazide (HYDRODIURIL) 12.5 MG tablet Take 1 tablet (12.5 mg total) by mouth daily.  Marland Kitchen ibuprofen (ADVIL) 400 MG tablet Take 1 tablet (400 mg total) by mouth 2 (two) times daily as needed.  . latanoprost (XALATAN) 0.005 % ophthalmic solution Place 1 drop into both eyes.   Marland Kitchen lisinopril (ZESTRIL) 20 MG tablet Take 1 tablet (20 mg total) by mouth daily. am  . meloxicam (MOBIC) 15 MG tablet Take 1 tablet (15 mg total) by mouth daily.  . pantoprazole (PROTONIX) 20 MG tablet Take 1 tablet (20 mg total) by mouth daily.  Marland Kitchen VITAMIN D PO Take by mouth daily.  Marland Kitchen VITAMIN E PO Take by mouth daily.  . [DISCONTINUED] pantoprazole (PROTONIX) 20 MG tablet Take 20 mg by mouth daily.    Facility-Administered Encounter Medications as of 07/04/2019  Medication  . triamcinolone acetonide (KENALOG) 10 MG/ML injection 10 mg    Surgical History: Past Surgical History:  Procedure Laterality Date  . ABDOMINAL HYSTERECTOMY    . BREAST EXCISIONAL  BIOPSY Left 2015   cyst removed  . CHOLECYSTECTOMY    . COLONOSCOPY N/A 09/25/2014   Procedure: COLONOSCOPY;  Surgeon: Lucilla Lame, MD;  Location: [facility];  Service: Gastroenterology;  Laterality: N/A;  cecum timeOK:[id number]  . CYST REMOVAL NECK    . ESOPHAGOGASTRODUODENOSCOPY N/A 09/25/2014   Procedure: ESOPHAGOGASTRODUODENOSCOPY (EGD);  Surgeon: Lucilla Lame, MD;  Location: [facility];  Service: Gastroenterology;  Laterality: N/A;  . ESOPHAGOGASTRODUODENOSCOPY (EGD) WITH PROPOFOL N/A 04/07/2018   Procedure: ESOPHAGOGASTRODUODENOSCOPY (EGD) WITH PROPOFOL;  Surgeon: Toledo, Benay Pike, MD;  Location: [facility];  Service: Gastroenterology;  Laterality: N/A;  . POLYPECTOMY  09/25/2014   Procedure: POLYPECTOMY INTESTINAL;  Surgeon: Lucilla Lame, MD;  Location: [facility];  Service: Gastroenterology;;    Medical History: Past Medical History:  Diagnosis Date  . Abdominal pain   . 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 Back pain    Left sciatica, low back pain  . GERD (gastroesophageal reflux disease)   . Glaucoma   . Hypertension   . Vitamin D deficiency     Family History: Family History  Problem Relation Age of Onset  . Heart disease Sister   . Diabetes Sister   . Hypertension Sister   . Heart disease Brother   . Diabetes Brother   . Hypertension Brother   . Breast cancer Daughter 30  . Colon cancer Mother   . Stroke Mother   .  Lung cancer Father     Social History   Socioeconomic History  . Marital status: Married    Spouse name: Not on file  . Number of children: Not on file  . Years of education: Not on file  . Highest education level: Not on file  Occupational History  . Not on file  Tobacco Use  . Smoking status: Former Smoker    Packs/day: 0.25    Years: 15.00    Pack years: 3.75    Types: Cigarettes  . Smokeless tobacco: Never Used  Substance and Sexual Activity  . Alcohol use: Yes    Comment: 3-5 glasses wine/month  . Drug  use: Never  . Sexual activity: Not on file  Other Topics Concern  . Not on file  Social History Narrative  . Not on file   Social Determinants of Health   Financial Resource Strain:   . Difficulty of Paying Living Expenses: Not on file  Food Insecurity:   . Worried About Charity fundraiser in the Last Year: Not on file  . Ran Out of Food in the Last Year: Not on file  Transportation Needs:   . Lack of Transportation (Medical): Not on file  . Lack of Transportation (Non-Medical): Not on file  Physical Activity:   . Days of Exercise per Week: Not on file  . Minutes of Exercise per Session: Not on file  Stress:   . Feeling of Stress : Not on file  Social Connections:   . Frequency of Communication with Friends and Family: Not on file  . Frequency of Social Gatherings with Friends and Family: Not on file  . Attends Religious Services: Not on file  . Active Member of Clubs or Organizations: Not on file  . Attends Archivist Meetings: Not on file  . Marital Status: Not on file  Intimate Partner Violence:   . Fear of Current or Ex-Partner: Not on file  . Emotionally Abused: Not on file  . Physically Abused: Not on file  . Sexually Abused: Not on file      Review of Systems  Constitutional: Negative for activity change and fatigue.  HENT: Negative for congestion, ear pain, postnasal drip and sinus pressure.        Intermittent.   Cardiovascular: Negative for chest pain and palpitations.  Gastrointestinal: Negative for abdominal distention, abdominal pain, constipation, nausea and vomiting.  Endocrine: Negative for cold intolerance, heat intolerance, polydipsia and polyuria.       Elevated blood sugars past few months.   Musculoskeletal: Negative for arthralgias, back pain and myalgias.  Skin: Negative for rash.  Allergic/Immunologic: Positive for environmental allergies.  Neurological: Positive for headaches. Negative for dizziness.  Hematological: Negative for  adenopathy.  Psychiatric/Behavioral: Negative for behavioral problems and dysphoric mood. The patient is nervous/anxious.     Today's Vitals   07/04/19 1057  BP: (!) 141/80  Pulse: 90  Resp: 16  Temp: (!) 97.4 F (36.3 C)  SpO2: 98%  Weight: 235 lb (106.6 kg)  Height: 5\' 7"  (1.702 m)   Body mass index is 36.81 kg/m.  Physical Exam Vitals and nursing note reviewed.  Constitutional:      General: She is not in acute distress.    Appearance: Normal appearance. She is well-developed. She is not diaphoretic.  HENT:     Head: Normocephalic and atraumatic.     Mouth/Throat:     Pharynx: No oropharyngeal exudate.  Eyes:     Conjunctiva/sclera: Conjunctivae normal.  Pupils: Pupils are equal, round, and reactive to light.  Neck:     Thyroid: No thyromegaly.     Vascular: No carotid bruit or JVD.     Trachea: No tracheal deviation.  Cardiovascular:     Rate and Rhythm: Normal rate and regular rhythm.     Heart sounds: Normal heart sounds. No murmur. No friction rub. No gallop.   Pulmonary:     Effort: Pulmonary effort is normal. No respiratory distress.     Breath sounds: Normal breath sounds. No wheezing or rales.  Chest:     Chest wall: No tenderness.  Abdominal:     General: Bowel sounds are normal.     Palpations: Abdomen is soft.     Tenderness: There is no abdominal tenderness. Negative signs include Murphy's sign and McBurney's sign.  Musculoskeletal:        General: Normal range of motion.     Cervical back: Normal range of motion and neck supple.  Lymphadenopathy:     Cervical: No cervical adenopathy.  Skin:    General: Skin is warm and dry.  Neurological:     General: No focal deficit present.     Mental Status: She is alert and oriented to person, place, and time.     Cranial Nerves: No cranial nerve deficit.  Psychiatric:        Behavior: Behavior normal.        Thought Content: Thought content normal.        Judgment: Judgment normal.     Assessment/Plan: 1. Type 2 diabetes mellitus with hyperglycemia, without long-term current use of insulin (HCC) - POCT HgB A1C 7.1 today. Discussed diet and exercise as ways to lower blood sugar without adding medication. She voiced understanding. Recheck HgbA1c at next visit. Medications to be started as indicated.   2. Essential hypertension Stable. Continue bp medication as prescribed  3. Gastroesophageal reflux disease without esophagitis Will continue with protonix prescription daily.  - pantoprazole (PROTONIX) 20 MG tablet; Take 1 tablet (20 mg total) by mouth daily.  Dispense: 90 tablet; Refill: 1  4. Encounter for hepatitis C screening test for low risk patient Screen for hepatitis C on routine, fasting labs.   General Counseling: velda spargur understanding of the findings of todays visit and agrees with plan of treatment. I have discussed any further diagnostic evaluation that may be needed or ordered today. We also reviewed her medications today. she has been encouraged to call the office with any questions or concerns that should arise related to todays visit.  Diabetes Counseling:  1. Addition of ACE inh/ ARB'S for nephroprotection. Microalbumin is updated  2. Diabetic foot care, prevention of complications. Podiatry consult 3. Exercise and lose weight.  4. Diabetic eye examination, Diabetic eye exam is updated  5. Monitor blood sugar closlely. nutrition counseling.  6. Sign and symptoms of hypoglycemia including shaking sweating,confusion and headaches.  This patient was seen by Leretha Pol FNP Collaboration with Dr Lavera Guise as a part of collaborative care agreement  Orders Placed This Encounter  Procedures  . POCT HgB A1C    Meds ordered this encounter  Medications  . pantoprazole (PROTONIX) 20 MG tablet    Sig: Take 1 tablet (20 mg total) by mouth daily.    Dispense:  90 tablet    Refill:  1    Order Specific Question:   Supervising Provider     Answer:   Lavera Guise [id number]    Total time spent:  30 Minutes  Time spent includes review of chart, medications, test results, and follow up plan with the patient.      Dr Lavera Guise Internal medicine

## 2019-07-06 DIAGNOSIS — Z1159 Encounter for screening for other viral diseases: Secondary | ICD-10-CM | POA: Insufficient documentation

## 2019-07-25 DIAGNOSIS — H401131 Primary open-angle glaucoma, bilateral, mild stage: Secondary | ICD-10-CM | POA: Diagnosis not present

## 2019-09-27 ENCOUNTER — Telehealth: Payer: Self-pay

## 2019-09-27 NOTE — Telephone Encounter (Signed)
Patient rescheduled appointment on 09/30/2019 to 10/11/2019. klh

## 2019-09-30 ENCOUNTER — Ambulatory Visit: Payer: PPO | Admitting: Nurse Practitioner

## 2019-10-05 ENCOUNTER — Other Ambulatory Visit: Payer: Self-pay | Admitting: Nurse Practitioner

## 2019-10-05 DIAGNOSIS — E1165 Type 2 diabetes mellitus with hyperglycemia: Secondary | ICD-10-CM | POA: Diagnosis not present

## 2019-10-05 DIAGNOSIS — I10 Essential (primary) hypertension: Secondary | ICD-10-CM | POA: Diagnosis not present

## 2019-10-05 DIAGNOSIS — E559 Vitamin D deficiency, unspecified: Secondary | ICD-10-CM | POA: Diagnosis not present

## 2019-10-05 DIAGNOSIS — Z0001 Encounter for general adult medical examination with abnormal findings: Secondary | ICD-10-CM | POA: Diagnosis not present

## 2019-10-06 LAB — LIPID PANEL W/O CHOL/HDL RATIO
Cholesterol, Total: 219 mg/dL — ABNORMAL HIGH (ref 100–199)
HDL: 58 mg/dL (ref >39)
LDL Chol Calc (NIH): 146 mg/dL — ABNORMAL HIGH (ref 0–99)
Triglycerides: 84 mg/dL (ref 0–149)
VLDL Cholesterol Cal: 15 mg/dL (ref 5–40)

## 2019-10-06 LAB — COMPREHENSIVE METABOLIC PANEL
ALT: 17 IU/L (ref 0–32)
AST: 14 IU/L (ref 0–40)
Albumin/Globulin Ratio: 1.3 (ref 1.2–2.2)
Albumin: 4 g/dL (ref 3.8–4.8)
Alkaline Phosphatase: 107 IU/L (ref 48–121)
BUN/Creatinine Ratio: 11 — ABNORMAL LOW (ref 12–28)
BUN: 9 mg/dL (ref 8–27)
Bilirubin Total: 0.5 mg/dL (ref 0.0–1.2)
CO2: 24 mmol/L (ref 20–29)
Calcium: 9.5 mg/dL (ref 8.7–10.3)
Chloride: 102 mmol/L (ref 96–106)
Creatinine, Ser: 0.83 mg/dL (ref 0.57–1.00)
GFR calc Af Amer: 85 mL/min/{1.73_m2} (ref >59)
GFR calc non Af Amer: 74 mL/min/{1.73_m2} (ref >59)
Globulin, Total: 3.1 g/dL (ref 1.5–4.5)
Glucose: 147 mg/dL — ABNORMAL HIGH (ref 65–99)
Potassium: 4.2 mmol/L (ref 3.5–5.2)
Sodium: 139 mmol/L (ref 134–144)
Total Protein: 7.1 g/dL (ref 6.0–8.5)

## 2019-10-06 LAB — CBC
Hematocrit: 39.8 % (ref 34.0–46.6)
Hemoglobin: 12.7 g/dL (ref 11.1–15.9)
MCH: 26.1 pg — ABNORMAL LOW (ref 26.6–33.0)
MCHC: 31.9 g/dL (ref 31.5–35.7)
MCV: 82 fL (ref 79–97)
Platelets: 354 10*3/uL (ref 150–450)
RBC: 4.86 x10E6/uL (ref 3.77–5.28)
RDW: 15.2 % (ref 11.7–15.4)
WBC: 7.1 10*3/uL (ref 3.4–10.8)

## 2019-10-06 LAB — T4, FREE: Free T4: 1.12 ng/dL (ref 0.82–1.77)

## 2019-10-06 LAB — VITAMIN D 25 HYDROXY (VIT D DEFICIENCY, FRACTURES): Vit D, 25-Hydroxy: 68.5 ng/mL (ref 30.0–100.0)

## 2019-10-06 LAB — TSH: TSH: 1.24 u[IU]/mL (ref 0.450–4.500)

## 2019-10-07 ENCOUNTER — Telehealth: Payer: Self-pay

## 2019-10-07 NOTE — Telephone Encounter (Signed)
Lmom to confirm and screen for 10-11-19 ov. 

## 2019-10-11 ENCOUNTER — Ambulatory Visit (INDEPENDENT_AMBULATORY_CARE_PROVIDER_SITE_OTHER): Payer: PPO | Admitting: Nurse Practitioner

## 2019-10-11 ENCOUNTER — Other Ambulatory Visit: Payer: Self-pay

## 2019-10-11 ENCOUNTER — Encounter: Payer: Self-pay | Admitting: Nurse Practitioner

## 2019-10-11 VITALS — BP 152/76 | HR 82 | Temp 97.3°F | Resp 16 | Ht 67.0 in | Wt 233.0 lb

## 2019-10-11 DIAGNOSIS — Z1211 Encounter for screening for malignant neoplasm of colon: Secondary | ICD-10-CM | POA: Insufficient documentation

## 2019-10-11 DIAGNOSIS — M150 Primary generalized (osteo)arthritis: Secondary | ICD-10-CM | POA: Diagnosis not present

## 2019-10-11 DIAGNOSIS — R002 Palpitations: Secondary | ICD-10-CM

## 2019-10-11 DIAGNOSIS — I10 Essential (primary) hypertension: Secondary | ICD-10-CM

## 2019-10-11 DIAGNOSIS — E1165 Type 2 diabetes mellitus with hyperglycemia: Secondary | ICD-10-CM | POA: Diagnosis not present

## 2019-10-11 HISTORY — DX: Palpitations: R00.2

## 2019-10-11 LAB — POCT GLYCOSYLATED HEMOGLOBIN (HGB A1C): Hemoglobin A1C: 7.1 % — AB (ref 4.0–5.6)

## 2019-10-11 MED ORDER — HYDROCHLOROTHIAZIDE 25 MG PO TABS: 25.0000 mg | ORAL_TABLET | Freq: Every day | ORAL | 1 refills | Status: DC

## 2019-10-11 NOTE — Progress Notes (Signed)
[facility] [place], [place] [address]  Internal MEDICINE  Office Visit Note  Patient Name: Holly Forbes  [id number]  [id number]  Date of Service: 10/11/2019  Chief Complaint  Patient presents with  . Diabetes  . Gastroesophageal Reflux  . Arthritis    The patient is here for here for routine follow up. Blood sugars are well managed. Her Hgba1c is 7.1 today. Blood pressure a little elevated at the start of visit. Patient admits to having increased family related stress. The patient's sister is visiting and sister has new health and mental health issues.  The patient states that she does get some right ankle/foot swelling. This is intermittent. Started when she fractured her foot last year. Goes back to normal after she rests and puts her foot up.  The patient did have routine, fasting labs done prior to this visit. She has mildly elevated.  Other labs were normal. Results were reviewed with the patient at the visit.       Current Medication: Outpatient Encounter Medications as of 10/11/2019  Medication Sig  . aspirin 81 MG tablet Take 81 mg by mouth daily. am  . dorzolamide-timolol (COSOPT) 22.3-6.8 MG/ML ophthalmic solution INSTILL 1 DROP INTO BOTH EYES QHS  . hydrochlorothiazide (HYDRODIURIL) 25 MG tablet Take 1 tablet (25 mg total) by mouth daily.  Marland Kitchen ibuprofen (ADVIL) 400 MG tablet Take 1 tablet (400 mg total) by mouth 2 (two) times daily as needed.  . latanoprost (XALATAN) 0.005 % ophthalmic solution Place 1 drop into both eyes.   Marland Kitchen lisinopril (ZESTRIL) 20 MG tablet Take 1 tablet (20 mg total) by mouth daily. am  . meloxicam (MOBIC) 15 MG tablet Take 1 tablet (15 mg total) by mouth daily.  . pantoprazole (PROTONIX) 20 MG tablet Take 1 tablet (20 mg total) by mouth daily.  Marland Kitchen VITAMIN D PO Take by mouth daily.  Marland Kitchen VITAMIN E PO Take by mouth daily.  . [DISCONTINUED] hydrochlorothiazide (HYDRODIURIL) 12.5 MG tablet Take 1 tablet (12.5 mg total) by  mouth daily.   Facility-Administered Encounter Medications as of 10/11/2019  Medication  . triamcinolone acetonide (KENALOG) 10 MG/ML injection 10 mg    Surgical History: Past Surgical History:  Procedure Laterality Date  . ABDOMINAL HYSTERECTOMY    . BREAST EXCISIONAL BIOPSY Left 2015   cyst removed  . CHOLECYSTECTOMY    . COLONOSCOPY N/A 09/25/2014   Procedure: COLONOSCOPY;  Surgeon: Lucilla Lame, MD;  Location: [facility];  Service: Gastroenterology;  Laterality: N/A;  cecum timeCN:[id number]  . CYST REMOVAL NECK    . ESOPHAGOGASTRODUODENOSCOPY N/A 09/25/2014   Procedure: ESOPHAGOGASTRODUODENOSCOPY (EGD);  Surgeon: Lucilla Lame, MD;  Location: [facility];  Service: Gastroenterology;  Laterality: N/A;  . ESOPHAGOGASTRODUODENOSCOPY (EGD) WITH PROPOFOL N/A 04/07/2018   Procedure: ESOPHAGOGASTRODUODENOSCOPY (EGD) WITH PROPOFOL;  Surgeon: Toledo, Benay Pike, MD;  Location: [facility];  Service: Gastroenterology;  Laterality: N/A;  . POLYPECTOMY  09/25/2014   Procedure: POLYPECTOMY INTESTINAL;  Surgeon: Lucilla Lame, MD;  Location: [facility];  Service: Gastroenterology;;    Medical History: Past Medical History:  Diagnosis Date  . Abdominal pain   . 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 Back pain    Left sciatica, low back pain  . GERD (gastroesophageal reflux disease)   . Glaucoma   . Hypertension   . Vitamin D deficiency     Family History: Family History  Problem Relation Age of Onset  . Heart disease Sister   .  Diabetes Sister   . Hypertension Sister   . Heart disease Brother   . Diabetes Brother   . Hypertension Brother   . Breast cancer Daughter 87  . Colon cancer Mother   . Stroke Mother   . Lung cancer Father     Social History   Socioeconomic History  . Marital status: Married    Spouse name: Not on file  . Number of children: Not on file  . Years of education: Not on file  . Highest education level: Not on file  Occupational  History  . Not on file  Tobacco Use  . Smoking status: Former Smoker    Packs/day: 0.25    Years: 15.00    Pack years: 3.75    Types: Cigarettes  . Smokeless tobacco: Never Used  Substance and Sexual Activity  . Alcohol use: Yes    Comment: 3-5 glasses wine/month  . Drug use: Never  . Sexual activity: Not on file  Other Topics Concern  . Not on file  Social History Narrative  . Not on file   Social Determinants of Health   Financial Resource Strain:   . Difficulty of Paying Living Expenses:   Food Insecurity:   . Worried About Charity fundraiser in the Last Year:   . Arboriculturist in the Last Year:   Transportation Needs:   . Film/video editor (Medical):   Marland Kitchen Lack of Transportation (Non-Medical):   Physical Activity:   . Days of Exercise per Week:   . Minutes of Exercise per Session:   Stress:   . Feeling of Stress :   Social Connections:   . Frequency of Communication with Friends and Family:   . Frequency of Social Gatherings with Friends and Family:   . Attends Religious Services:   . Active Member of Clubs or Organizations:   . Attends Archivist Meetings:   Marland Kitchen Marital Status:   Intimate Partner Violence:   . Fear of Current or Ex-Partner:   . Emotionally Abused:   Marland Kitchen Physically Abused:   . Sexually Abused:       Review of Systems  Constitutional: Negative for activity change and fatigue.  HENT: Negative for congestion, ear pain, postnasal drip and sinus pressure.        Intermittent.   Respiratory: Negative for cough, choking, chest tightness, shortness of breath and wheezing.   Cardiovascular: Positive for palpitations. Negative for chest pain.       Mildly elevated blood pressure today. Right foot swelling in the afternoons and evenings. Will have some palpitations intermittently.   Gastrointestinal: Negative for abdominal distention, abdominal pain, constipation, nausea and vomiting.  Endocrine: Negative for cold intolerance, heat  intolerance, polydipsia and polyuria.       Blood sugars are stable.   Musculoskeletal: Negative for arthralgias, back pain and myalgias.       Right ankle tenderness and swelling. Mostly in the afternoons and evenings.   Skin: Negative for rash.  Allergic/Immunologic: Positive for environmental allergies.  Neurological: Positive for headaches. Negative for dizziness.  Hematological: Negative for adenopathy.  Psychiatric/Behavioral: Negative for behavioral problems and dysphoric mood. The patient is nervous/anxious.     Today's Vitals   10/11/19 1414  BP: (!) 152/76  Pulse: 82  Resp: 16  Temp: (!) 97.3 F (36.3 C)  SpO2: 98%  Weight: 233 lb (105.7 kg)  Height: 5\' 7"  (1.702 m)   Body mass index is 36.49 kg/m.   Physical Exam  Vitals and nursing note reviewed.  Constitutional:      General: She is not in acute distress.    Appearance: Normal appearance. She is well-developed. She is not diaphoretic.  HENT:     Head: Normocephalic and atraumatic.     Mouth/Throat:     Pharynx: No oropharyngeal exudate.  Eyes:     Conjunctiva/sclera: Conjunctivae normal.     Pupils: Pupils are equal, round, and reactive to light.  Neck:     Thyroid: No thyromegaly.     Vascular: No carotid bruit or JVD.     Trachea: No tracheal deviation.  Cardiovascular:     Rate and Rhythm: Normal rate and regular rhythm.     Heart sounds: Normal heart sounds. No murmur. No friction rub. No gallop.   Pulmonary:     Effort: Pulmonary effort is normal. No respiratory distress.     Breath sounds: Normal breath sounds. No wheezing or rales.  Chest:     Chest wall: No tenderness.  Abdominal:     Palpations: Abdomen is soft.     Tenderness: Negative signs include Murphy's sign and McBurney's sign.  Musculoskeletal:        General: Normal range of motion.     Cervical back: Normal range of motion and neck supple.     Comments: The patient has intermittent groin pain, especially with exertion. Ibuprofen  will help this.   Lymphadenopathy:     Cervical: No cervical adenopathy.  Skin:    General: Skin is warm and dry.  Neurological:     General: No focal deficit present.     Mental Status: She is alert and oriented to person, place, and time.     Cranial Nerves: No cranial nerve deficit.  Psychiatric:        Behavior: Behavior normal.        Thought Content: Thought content normal.        Judgment: Judgment normal.    Assessment/Plan:  1. Type 2 diabetes mellitus with hyperglycemia, without long-term current use of insulin (HCC) - POCT HgB A1C  7.1 today. Continue to monitor and control with patient.   2. Essential hypertension Increase HCTZ to 25mg  daily. Continue lisinopril as prescribed - hydrochlorothiazide (HYDRODIURIL) 25 MG tablet; Take 1 tablet (25 mg total) by mouth daily.  Dispense: 90 tablet; Refill: 1  3. Palpitations Very intermittent. Currently not having them now. Increased HCTZ to help with blood pressure control. Will reassess at next visit.   4. Primary generalized (osteo)arthritis May use ibuprofen as needed and as indicated.   5. Screening for colon cancer Referral to GI for screening colonoscopy  - Ambulatory referral to Gastroenterology  General Counseling: quana mclanahan understanding of the findings of todays visit and agrees with plan of treatment. I have discussed any further diagnostic evaluation that may be needed or ordered today. We also reviewed her medications today. she has been encouraged to call the office with any questions or concerns that should arise related to todays visit.  Hypertension Counseling:   The following hypertensive lifestyle modification were recommended and discussed:  1. Limiting alcohol intake to less than 1 oz/day of ethanol:(24 oz of beer or 8 oz of wine or 2 oz of 100-proof whiskey). 2. Take baby ASA 81 mg daily. 3. Importance of regular aerobic exercise and losing weight. 4. Reduce dietary saturated fat and  cholesterol intake for overall cardiovascular health. 5. Maintaining adequate dietary potassium, calcium, and magnesium intake. 6. Regular monitoring of the blood  pressure. 7. Reduce sodium intake to less than 100 mmol/day (less than 2.3 gm of sodium or less than 6 gm of sodium choride)   This patient was seen by Holly Forbes with Dr Lavera Guise as a part of collaborative care agreement  Orders Placed This Encounter  Procedures  . Ambulatory referral to Gastroenterology  . POCT HgB A1C    Meds ordered this encounter  Medications  . hydrochlorothiazide (HYDRODIURIL) 25 MG tablet    Sig: Take 1 tablet (25 mg total) by mouth daily.    Dispense:  90 tablet    Refill:  1    Patient does not need this yet, even with increased dosing. Please fill as 90 day prescription with next fill.    Order Specific Question:   Supervising Provider    Answer:   Lavera Guise [id number]    Total time spent: 30 Minutes   Time spent includes review of chart, medications, test results, and follow up plan with the patient.      Dr Lavera Guise Internal medicine

## 2019-11-04 ENCOUNTER — Telehealth: Payer: Self-pay

## 2019-11-04 NOTE — Telephone Encounter (Signed)
Confirmed and screened for 11-08-19 ov. °

## 2019-11-08 ENCOUNTER — Other Ambulatory Visit: Payer: Self-pay

## 2019-11-08 ENCOUNTER — Encounter: Payer: Self-pay | Admitting: Nurse Practitioner

## 2019-11-08 ENCOUNTER — Ambulatory Visit (INDEPENDENT_AMBULATORY_CARE_PROVIDER_SITE_OTHER): Payer: PPO | Admitting: Nurse Practitioner

## 2019-11-08 VITALS — BP 150/68 | HR 71 | Temp 97.4°F | Resp 16 | Ht 67.0 in | Wt 238.0 lb

## 2019-11-08 DIAGNOSIS — E1165 Type 2 diabetes mellitus with hyperglycemia: Secondary | ICD-10-CM | POA: Diagnosis not present

## 2019-11-08 DIAGNOSIS — K219 Gastro-esophageal reflux disease without esophagitis: Secondary | ICD-10-CM | POA: Diagnosis not present

## 2019-11-08 DIAGNOSIS — I10 Essential (primary) hypertension: Secondary | ICD-10-CM

## 2019-11-08 MED ORDER — HYDROCHLOROTHIAZIDE 12.5 MG PO TABS: 12.5000 mg | ORAL_TABLET | Freq: Every day | ORAL | 3 refills | Status: DC

## 2019-11-08 MED ORDER — LISINOPRIL 20 MG PO TABS: 30.0000 mg | ORAL_TABLET | Freq: Every day | ORAL | 1 refills | Status: DC

## 2019-11-08 NOTE — Progress Notes (Signed)
[facility] [place], [place] [address]  Internal MEDICINE  Office Visit Note  Patient Name: Holly Forbes  [id number]  [id number]  Date of Service: 11/16/2019  Chief Complaint  Patient presents with  . Follow-up  . Gastroesophageal Reflux  . Hypertension    The patient is here for follow up visit. Blood pressure has been slightly elevated. Increased her hctz to two tablets daily. Blood pressure did come down, but caused her to have some leg and muscle cramps. She has dropped this back to once daily (12.5mg  daily". Blood pressure is about the same as it was prior to making any changes.  She states that she does feel some changes in the fullness and pressure in her ears. This is happening intermittently. States that ears don't hurt. She just notices a difference in the sounds she hears. Sometimes volume will change then go back to normal without anyone changing the volume at all.       Current Medication: Outpatient Encounter Medications as of 11/08/2019  Medication Sig  . aspirin 81 MG tablet Take 81 mg by mouth daily. am  . dorzolamide-timolol (COSOPT) 22.3-6.8 MG/ML ophthalmic solution INSTILL 1 DROP INTO BOTH EYES QHS  . hydrochlorothiazide (HYDRODIURIL) 12.5 MG tablet Take 1 tablet (12.5 mg total) by mouth daily.  Marland Kitchen ibuprofen (ADVIL) 400 MG tablet Take 1 tablet (400 mg total) by mouth 2 (two) times daily as needed.  . latanoprost (XALATAN) 0.005 % ophthalmic solution Place 1 drop into both eyes.   Marland Kitchen lisinopril (ZESTRIL) 20 MG tablet Take 1.5 tablets (30 mg total) by mouth daily.  . meloxicam (MOBIC) 15 MG tablet Take 1 tablet (15 mg total) by mouth daily.  . pantoprazole (PROTONIX) 20 MG tablet Take 1 tablet (20 mg total) by mouth daily.  Marland Kitchen VITAMIN D PO Take by mouth daily.  Marland Kitchen VITAMIN E PO Take by mouth daily.  . [DISCONTINUED] hydrochlorothiazide (HYDRODIURIL) 25 MG tablet Take 1 tablet (25 mg total) by mouth daily.  . [DISCONTINUED]  lisinopril (ZESTRIL) 20 MG tablet Take 1 tablet (20 mg total) by mouth daily. am   Facility-Administered Encounter Medications as of 11/08/2019  Medication  . triamcinolone acetonide (KENALOG) 10 MG/ML injection 10 mg    Surgical History: Past Surgical History:  Procedure Laterality Date  . ABDOMINAL HYSTERECTOMY    . BREAST EXCISIONAL BIOPSY Left 2015   cyst removed  . CHOLECYSTECTOMY    . COLONOSCOPY N/A 09/25/2014   Procedure: COLONOSCOPY;  Surgeon: Lucilla Lame, MD;  Location: [facility];  Service: Gastroenterology;  Laterality: N/A;  cecum time- 8413  . CYST REMOVAL NECK    . ESOPHAGOGASTRODUODENOSCOPY N/A 09/25/2014   Procedure: ESOPHAGOGASTRODUODENOSCOPY (EGD);  Surgeon: Lucilla Lame, MD;  Location: [facility];  Service: Gastroenterology;  Laterality: N/A;  . ESOPHAGOGASTRODUODENOSCOPY (EGD) WITH PROPOFOL N/A 04/07/2018   Procedure: ESOPHAGOGASTRODUODENOSCOPY (EGD) WITH PROPOFOL;  Surgeon: Toledo, Benay Pike, MD;  Location: [facility];  Service: Gastroenterology;  Laterality: N/A;  . POLYPECTOMY  09/25/2014   Procedure: POLYPECTOMY INTESTINAL;  Surgeon: Lucilla Lame, MD;  Location: [place];  Service: Gastroenterology;;    Medical History: Past Medical History:  Diagnosis Date  . Abdominal pain   . 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 Back pain    Left sciatica, low back pain  . GERD (gastroesophageal reflux disease)   . Glaucoma   . Hypertension   . Vitamin D deficiency     Family History: Family History  Problem Relation Age of Onset  . Heart disease Sister   . Diabetes Sister   . Hypertension Sister   . Heart disease Brother   . Diabetes Brother   . Hypertension Brother   . Breast cancer Daughter 38  . Colon cancer Mother   . Stroke Mother   . Lung cancer Father     Social History   Socioeconomic History  . Marital status: Married    Spouse name: Not on file  . Number of children: Not on file  . Years of education: Not  on file  . Highest education level: Not on file  Occupational History  . Not on file  Tobacco Use  . Smoking status: Former Smoker    Packs/day: 0.25    Years: 15.00    Pack years: 3.75    Types: Cigarettes  . Smokeless tobacco: Never Used  Vaping Use  . Vaping Use: Never used  Substance and Sexual Activity  . Alcohol use: Yes    Comment: 3-5 glasses wine/month  . Drug use: Never  . Sexual activity: Not on file  Other Topics Concern  . Not on file  Social History Narrative  . Not on file   Social Determinants of Health   Financial Resource Strain:   . Difficulty of Paying Living Expenses:   Food Insecurity:   . Worried About Charity fundraiser in the Last Year:   . Arboriculturist in the Last Year:   Transportation Needs:   . Film/video editor (Medical):   Marland Kitchen Lack of Transportation (Non-Medical):   Physical Activity:   . Days of Exercise per Week:   . Minutes of Exercise per Session:   Stress:   . Feeling of Stress :   Social Connections:   . Frequency of Communication with Friends and Family:   . Frequency of Social Gatherings with Friends and Family:   . Attends Religious Services:   . Active Member of Clubs or Organizations:   . Attends Archivist Meetings:   Marland Kitchen Marital Status:   Intimate Partner Violence:   . Fear of Current or Ex-Partner:   . Emotionally Abused:   Marland Kitchen Physically Abused:   . Sexually Abused:       Review of Systems  Constitutional: Negative for activity change, chills, fatigue and unexpected weight change.  HENT: Negative for congestion, postnasal drip, rhinorrhea, sneezing and sore throat.        Ear congestion, fullness in the ear. Mostly in right ear.   Respiratory: Negative for cough, chest tightness, shortness of breath and wheezing.   Cardiovascular: Negative for chest pain and palpitations.       Elevated blood pressure today. Unable to tolerate increased dose HCTZ. Caused muscle pain/cramps.   Gastrointestinal:  Negative for abdominal pain, constipation, diarrhea, nausea and vomiting.  Genitourinary: Negative for dysuria and frequency.  Musculoskeletal: Negative for arthralgias, back pain, joint swelling and neck pain.  Skin: Negative for rash.  Neurological: Negative.  Negative for tremors and numbness.  Hematological: Negative for adenopathy. Does not bruise/bleed easily.  Psychiatric/Behavioral: Negative for behavioral problems (Depression), sleep disturbance and suicidal ideas. The patient is not nervous/anxious.     Today's Vitals   11/08/19 1052  BP: (!) 150/68  Pulse: 71  Resp: 16  Temp: (!) 97.4 F (36.3 C)  SpO2: 97%  Weight: 238 lb (108 kg)  Height: 5\' 7"  (1.702 m)   Body mass index is 37.28 kg/m.   Physical Exam  Vitals and nursing note reviewed.  Constitutional:      General: She is not in acute distress.    Appearance: Normal appearance. She is well-developed. She is not diaphoretic.  HENT:     Head: Normocephalic and atraumatic.     Mouth/Throat:     Pharynx: No oropharyngeal exudate.  Eyes:     Conjunctiva/sclera: Conjunctivae normal.     Pupils: Pupils are equal, round, and reactive to light.  Neck:     Thyroid: No thyromegaly.     Vascular: No carotid bruit or JVD.     Trachea: No tracheal deviation.  Cardiovascular:     Rate and Rhythm: Normal rate and regular rhythm.     Heart sounds: Normal heart sounds. No murmur heard.  No friction rub. No gallop.   Pulmonary:     Effort: Pulmonary effort is normal. No respiratory distress.     Breath sounds: Normal breath sounds. No wheezing or rales.  Chest:     Chest wall: No tenderness.  Abdominal:     General: Bowel sounds are normal.     Palpations: Abdomen is soft.     Tenderness: There is no abdominal tenderness. Negative signs include Murphy's sign and McBurney's sign.  Musculoskeletal:        General: Normal range of motion.     Cervical back: Normal range of motion and neck supple.  Lymphadenopathy:      Cervical: No cervical adenopathy.  Skin:    General: Skin is warm and dry.  Neurological:     General: No focal deficit present.     Mental Status: She is alert and oriented to person, place, and time.     Cranial Nerves: No cranial nerve deficit.  Psychiatric:        Behavior: Behavior normal.        Thought Content: Thought content normal.        Judgment: Judgment normal.   Assessment/Plan: 1. Essential hypertension Resume HCTZ at 12.5mg  daily. Increase lisinopril to 30mg  daily. Encouraged her to monitor her blood pressure routinely. Reassess at next visit.  - hydrochlorothiazide (HYDRODIURIL) 12.5 MG tablet; Take 1 tablet (12.5 mg total) by mouth daily.  Dispense: 90 tablet; Refill: 3 - lisinopril (ZESTRIL) 20 MG tablet; Take 1.5 tablets (30 mg total) by mouth daily.  Dispense: 135 tablet; Refill: 1  2. Type 2 diabetes mellitus with hyperglycemia, without long-term current use of insulin (HCC) continue diabetic medication as prescribed   3. Gastroesophageal reflux disease without esophagitis Continue pantoprazole as prescribed   General Counseling: Clancy verbalizes understanding of the findings of todays visit and agrees with plan of treatment. I have discussed any further diagnostic evaluation that may be needed or ordered today. We also reviewed her medications today. she has been encouraged to call the office with any questions or concerns that should arise related to todays visit.   Hypertension Counseling:   The following hypertensive lifestyle modification were recommended and discussed:  1. Limiting alcohol intake to less than 1 oz/day of ethanol:(24 oz of beer or 8 oz of wine or 2 oz of 100-proof whiskey). 2. Take baby ASA 81 mg daily. 3. Importance of regular aerobic exercise and losing weight. 4. Reduce dietary saturated fat and cholesterol intake for overall cardiovascular health. 5. Maintaining adequate dietary potassium, calcium, and magnesium intake. 6. Regular  monitoring of the blood pressure. 7. Reduce sodium intake to less than 100 mmol/day (less than 2.3 gm of sodium or less than 6 gm of  sodium choride)   This patient was seen by [place] with Dr Lavera Guise as a part of collaborative care agreement  Meds ordered this encounter  Medications  . hydrochlorothiazide (HYDRODIURIL) 12.5 MG tablet    Sig: Take 1 tablet (12.5 mg total) by mouth daily.    Dispense:  90 tablet    Refill:  3    Gong back to 12.5mg  tablets daily as 25mg  caused her to have leg pain and muscle craps.    Order Specific Question:   Supervising Provider    Answer:   Lavera Guise [[id number]]  . lisinopril (ZESTRIL) 20 MG tablet    Sig: Take 1.5 tablets (30 mg total) by mouth daily.    Dispense:  135 tablet    Refill:  1    Please note increased dose. Thanks    Order Specific Question:   Supervising Provider    Answer:   Lavera Guise [[id number]]    Total time spent: 20 Minutes   Time spent includes review of chart, medications, test results, and follow up plan with the patient.      Dr Lavera Guise Internal medicine

## 2019-11-25 DIAGNOSIS — H401131 Primary open-angle glaucoma, bilateral, mild stage: Secondary | ICD-10-CM | POA: Diagnosis not present

## 2019-11-29 DIAGNOSIS — H2513 Age-related nuclear cataract, bilateral: Secondary | ICD-10-CM | POA: Diagnosis not present

## 2019-12-05 ENCOUNTER — Other Ambulatory Visit: Payer: Self-pay

## 2019-12-05 DIAGNOSIS — I10 Essential (primary) hypertension: Secondary | ICD-10-CM

## 2019-12-05 MED ORDER — LISINOPRIL 20 MG PO TABS: 30.0000 mg | ORAL_TABLET | Freq: Every day | ORAL | 1 refills | Status: DC

## 2019-12-06 ENCOUNTER — Other Ambulatory Visit: Payer: Self-pay

## 2019-12-06 MED ORDER — IBUPROFEN 400 MG PO TABS: 400.0000 mg | ORAL_TABLET | Freq: Two times a day (BID) | ORAL | 3 refills | Status: DC | PRN

## 2020-01-02 ENCOUNTER — Other Ambulatory Visit: Payer: Self-pay

## 2020-01-02 DIAGNOSIS — K219 Gastro-esophageal reflux disease without esophagitis: Secondary | ICD-10-CM

## 2020-01-02 MED ORDER — PANTOPRAZOLE SODIUM 20 MG PO TBEC: 20.0000 mg | DELAYED_RELEASE_TABLET | Freq: Every day | ORAL | 1 refills | Status: DC

## 2020-01-27 ENCOUNTER — Telehealth: Payer: Self-pay

## 2020-01-27 NOTE — Telephone Encounter (Signed)
confirmed and screened for office visit on 9/13

## 2020-01-30 ENCOUNTER — Ambulatory Visit (INDEPENDENT_AMBULATORY_CARE_PROVIDER_SITE_OTHER): Payer: PPO | Admitting: Nurse Practitioner

## 2020-01-30 ENCOUNTER — Other Ambulatory Visit: Payer: Self-pay

## 2020-01-30 ENCOUNTER — Encounter: Payer: Self-pay | Admitting: Nurse Practitioner

## 2020-01-30 VITALS — BP 180/80 | HR 75 | Temp 97.5°F | Resp 16 | Ht 67.0 in | Wt 236.0 lb

## 2020-01-30 DIAGNOSIS — R1032 Left lower quadrant pain: Secondary | ICD-10-CM

## 2020-01-30 DIAGNOSIS — E785 Hyperlipidemia, unspecified: Secondary | ICD-10-CM

## 2020-01-30 DIAGNOSIS — E1169 Type 2 diabetes mellitus with other specified complication: Secondary | ICD-10-CM | POA: Diagnosis not present

## 2020-01-30 DIAGNOSIS — Z1231 Encounter for screening mammogram for malignant neoplasm of breast: Secondary | ICD-10-CM

## 2020-01-30 DIAGNOSIS — E119 Type 2 diabetes mellitus without complications: Secondary | ICD-10-CM | POA: Diagnosis not present

## 2020-01-30 DIAGNOSIS — I10 Essential (primary) hypertension: Secondary | ICD-10-CM | POA: Diagnosis not present

## 2020-01-30 DIAGNOSIS — Z23 Encounter for immunization: Secondary | ICD-10-CM | POA: Diagnosis not present

## 2020-01-30 DIAGNOSIS — R1031 Right lower quadrant pain: Secondary | ICD-10-CM | POA: Diagnosis not present

## 2020-01-30 DIAGNOSIS — R300 Dysuria: Secondary | ICD-10-CM

## 2020-01-30 DIAGNOSIS — Z0001 Encounter for general adult medical examination with abnormal findings: Secondary | ICD-10-CM

## 2020-01-30 LAB — POCT GLYCOSYLATED HEMOGLOBIN (HGB A1C): Hemoglobin A1C: 6.9 % — AB (ref 4.0–5.6)

## 2020-01-30 MED ORDER — ROSUVASTATIN CALCIUM 5 MG PO TABS: 5.0000 mg | ORAL_TABLET | Freq: Every day | ORAL | 3 refills | Status: DC

## 2020-01-30 MED ORDER — HYDROCHLOROTHIAZIDE 12.5 MG PO TABS: 25.0000 mg | ORAL_TABLET | Freq: Every day | ORAL | 3 refills | Status: DC

## 2020-01-30 MED ORDER — PNEUMOVAX 23 25 MCG/0.5ML IJ INJ: INJECTION | INTRAMUSCULAR | 0 refills | Status: DC

## 2020-01-30 MED ORDER — LISINOPRIL 20 MG PO TABS: 40.0000 mg | ORAL_TABLET | Freq: Every day | ORAL | 1 refills | Status: DC

## 2020-01-30 NOTE — Patient Instructions (Signed)
Sciatica Rehab Ask your health care provider which exercises are safe for you. Do exercises exactly as told by your health care provider and adjust them as directed. It is normal to feel mild stretching, pulling, tightness, or discomfort as you do these exercises. Stop right away if you feel sudden pain or your pain gets worse. Do not begin these exercises until told by your health care provider. Stretching and range-of-motion exercises These exercises warm up your muscles and joints and improve the movement and flexibility of your hips and back. These exercises also help to relieve pain, numbness, and tingling. Sciatic nerve glide 1. Sit in a chair with your head facing down toward your chest. Place your hands behind your back. Let your shoulders slump forward. 2. Slowly straighten one of your legs while you tilt your head back as if you are looking toward the ceiling. Only straighten your leg as far as you can without making your symptoms worse. 3. Hold this position for __________ seconds. 4. Slowly return to the starting position. 5. Repeat with your other leg. Repeat __________ times. Complete this exercise __________ times a day. Knee to chest with hip adduction and internal rotation  1. Lie on your back on a firm surface with both legs straight. 2. Bend one of your knees and move it up toward your chest until you feel a gentle stretch in your lower back and buttock. Then, move your knee toward the shoulder that is on the opposite side from your leg. This is hip adduction and internal rotation. ? Hold your leg in this position by holding on to the front of your knee. 3. Hold this position for __________ seconds. 4. Slowly return to the starting position. 5. Repeat with your other leg. Repeat __________ times. Complete this exercise __________ times a day. Prone extension on elbows  1. Lie on your abdomen on a firm surface. A bed may be too soft for this exercise. 2. Prop yourself up on  your elbows. 3. Use your arms to help lift your chest up until you feel a gentle stretch in your abdomen and your lower back. ? This will place some of your body weight on your elbows. If this is uncomfortable, try stacking pillows under your chest. ? Your hips should stay down, against the surface that you are lying on. Keep your hip and back muscles relaxed. 4. Hold this position for __________ seconds. 5. Slowly relax your upper body and return to the starting position. Repeat __________ times. Complete this exercise __________ times a day. Strengthening exercises These exercises build strength and endurance in your back. Endurance is the ability to use your muscles for a long time, even after they get tired. Pelvic tilt This exercise strengthens the muscles that lie deep in the abdomen. 1. Lie on your back on a firm surface. Bend your knees and keep your feet flat on the floor. 2. Tense your abdominal muscles. Tip your pelvis up toward the ceiling and flatten your lower back into the floor. ? To help with this exercise, you may place a small towel under your lower back and try to push your back into the towel. 3. Hold this position for __________ seconds. 4. Let your muscles relax completely before you repeat this exercise. Repeat __________ times. Complete this exercise __________ times a day. Alternating arm and leg raises  1. Get on your hands and knees on a firm surface. If you are on a hard floor, you may want to use   padding, such as an exercise mat, to cushion your knees. 2. Line up your arms and legs. Your hands should be directly below your shoulders, and your knees should be directly below your hips. 3. Lift your left leg behind you. At the same time, raise your right arm and straighten it in front of you. ? Do not lift your leg higher than your hip. ? Do not lift your arm higher than your shoulder. ? Keep your abdominal and back muscles tight. ? Keep your hips facing the  ground. ? Do not arch your back. ? Keep your balance carefully, and do not hold your breath. 4. Hold this position for __________ seconds. 5. Slowly return to the starting position. 6. Repeat with your right leg and your left arm. Repeat __________ times. Complete this exercise __________ times a day. Posture and body mechanics Good posture and healthy body mechanics can help to relieve stress in your body's tissues and joints. Body mechanics refers to the movements and positions of your body while you do your daily activities. Posture is part of body mechanics. Good posture means:  Your spine is in its natural S-curve position (neutral).  Your shoulders are pulled back slightly.  Your head is not tipped forward. Follow these guidelines to improve your posture and body mechanics in your everyday activities. Standing   When standing, keep your spine neutral and your feet about hip width apart. Keep a slight bend in your knees. Your ears, shoulders, and hips should line up.  When you do a task in which you stand in one place for a long time, place one foot up on a stable object that is 2-4 inches (5-10 cm) high, such as a footstool. This helps keep your spine neutral. Sitting   When sitting, keep your spine neutral and keep your feet flat on the floor. Use a footrest, if necessary, and keep your thighs parallel to the floor. Avoid rounding your shoulders, and avoid tilting your head forward.  When working at a desk or a computer, keep your desk at a height where your hands are slightly lower than your elbows. Slide your chair under your desk so you are close enough to maintain good posture.  When working at a computer, place your monitor at a height where you are looking straight ahead and you do not have to tilt your head forward or downward to look at the screen. Resting  When lying down and resting, avoid positions that are most painful for you.  If you have pain with activities  such as sitting, bending, stooping, or squatting, lie in a position in which your body does not bend very much. For example, avoid curling up on your side with your arms and knees near your chest (fetal position).  If you have pain with activities such as standing for a long time or reaching with your arms, lie with your spine in a neutral position and bend your knees slightly. Try the following positions: ? Lying on your side with a pillow between your knees. ? Lying on your back with a pillow under your knees. Lifting   When lifting objects, keep your feet at least shoulder width apart and tighten your abdominal muscles.  Bend your knees and hips and keep your spine neutral. It is important to lift using the strength of your legs, not your back. Do not lock your knees straight out.  Always ask for help to lift heavy or awkward objects. This information is not   intended to replace advice given to you by your health care provider. Make sure you discuss any questions you have with your health care provider. Document Revised: 08/27/2018 Document Reviewed: 05/27/2018 [employer] Patient Education  2020 [employer]  

## 2020-01-30 NOTE — Progress Notes (Signed)
[facility], [place] [address]  Internal MEDICINE  Office Visit Note  Patient Name: Holly Forbes  [id number]  [id number]  Date of Service: 02/15/2020   Pt is here for routine health maintenance examination   Chief Complaint  Patient presents with  . Medicare Wellness  . Gastroesophageal Reflux  . Hypertension  . Hip Pain    pain on right butt cheek that goes down right leg  . Groin Pain    bilateral groin pain. worse with exertion     The patient is here for health maintenance exam. She states that she is having bilateral groin pain. This initially started hurting more when she was standing up from a seated or laying position to a standing position. She does not believe this is abdominal or pelvic pain, but more musculoskeletal in nature. She also has some moderate pain in the right buttock. Describes this pain as a pulling sensation in the back of her leg. Hurts when she is more active. The more she sits, the more painful this area gets.  Blood pressure is very high today. She states that she is taking her lisinopril at 20mg  and HCTZ at 12.5mg  daily. She states that she has felt that her blood pressure was elevated, but not idea it was this high.    Current Medication: Outpatient Encounter Medications as of 01/30/2020  Medication Sig  . aspirin 81 MG tablet Take 81 mg by mouth daily. am  . dorzolamide-timolol (COSOPT) 22.3-6.8 MG/ML ophthalmic solution INSTILL 1 DROP INTO BOTH EYES QHS  . hydrochlorothiazide (HYDRODIURIL) 12.5 MG tablet Take 2 tablets (25 mg total) by mouth daily.  Marland Kitchen ibuprofen (ADVIL) 400 MG tablet Take 1 tablet (400 mg total) by mouth 2 (two) times daily as needed.  . latanoprost (XALATAN) 0.005 % ophthalmic solution Place 1 drop into both eyes.   Marland Kitchen lisinopril (ZESTRIL) 20 MG tablet Take 2 tablets (40 mg total) by mouth daily.  . meloxicam (MOBIC) 15 MG tablet Take 1 tablet (15 mg total) by mouth daily.  .  pantoprazole (PROTONIX) 20 MG tablet Take 1 tablet (20 mg total) by mouth daily.  Marland Kitchen VITAMIN D PO Take by mouth daily.  Marland Kitchen VITAMIN E PO Take by mouth daily.  . [DISCONTINUED] hydrochlorothiazide (HYDRODIURIL) 12.5 MG tablet Take 1 tablet (12.5 mg total) by mouth daily.  . [DISCONTINUED] lisinopril (ZESTRIL) 20 MG tablet Take 1.5 tablets (30 mg total) by mouth daily.  . pneumococcal 23 valent vaccine (PNEUMOVAX 23) 25 MCG/0.5ML injection Inject 0.71ml IM once  . rosuvastatin (CRESTOR) 5 MG tablet Take 1 tablet (5 mg total) by mouth daily.   Facility-Administered Encounter Medications as of 01/30/2020  Medication  . triamcinolone acetonide (KENALOG) 10 MG/ML injection 10 mg    Surgical History: Past Surgical History:  Procedure Laterality Date  . ABDOMINAL HYSTERECTOMY    . BREAST EXCISIONAL BIOPSY Left 2015   cyst removed  . CHOLECYSTECTOMY    . COLONOSCOPY N/A 09/25/2014   Procedure: COLONOSCOPY;  Surgeon: Lucilla Lame, MD;  Location: [place];  Service: Gastroenterology;  Laterality: N/A;  cecum time- 7412  . CYST REMOVAL NECK    . ESOPHAGOGASTRODUODENOSCOPY N/A 09/25/2014   Procedure: ESOPHAGOGASTRODUODENOSCOPY (EGD);  Surgeon: Lucilla Lame, MD;  Location: [place];  Service: Gastroenterology;  Laterality: N/A;  . ESOPHAGOGASTRODUODENOSCOPY (EGD) WITH PROPOFOL N/A 04/07/2018   Procedure: ESOPHAGOGASTRODUODENOSCOPY (EGD) WITH PROPOFOL;  Surgeon: Toledo, Benay Pike, MD;  Location: [facility];  Service: Gastroenterology;  Laterality: N/A;  .  POLYPECTOMY  09/25/2014   Procedure: POLYPECTOMY INTESTINAL;  Surgeon: Lucilla Lame, MD;  Location: [place];  Service: Gastroenterology;;    Medical History: Past Medical History:  Diagnosis Date  . Abdominal pain   . 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 Back pain    Left sciatica, low back pain  . GERD (gastroesophageal reflux disease)   . Glaucoma   . Hypertension   . Vitamin D deficiency     Family  History: Family History  Problem Relation Age of Onset  . Heart disease Sister   . Diabetes Sister   . Hypertension Sister   . Heart disease Brother   . Diabetes Brother   . Hypertension Brother   . Breast cancer Daughter 42  . Colon cancer Mother   . Stroke Mother   . Lung cancer Father       Review of Systems  Constitutional: Positive for fatigue. Negative for activity change, chills and unexpected weight change.  HENT: Negative for congestion, postnasal drip, rhinorrhea, sneezing and sore throat.   Respiratory: Negative for cough, chest tightness and shortness of breath.   Cardiovascular: Positive for leg swelling. Negative for chest pain and palpitations.       High blood pressure today.   Gastrointestinal: Negative for abdominal pain, constipation, diarrhea, nausea and vomiting.  Endocrine: Negative for cold intolerance, heat intolerance, polydipsia and polyuria.       Blood sugars doing well   Musculoskeletal: Positive for arthralgias and myalgias. Negative for back pain, joint swelling and neck pain.       Bilateral hip pain. Pain is mostly in the groin. Hurts more when she flexes and extends her legs at the hip. Pulling her leg up to go up stairs is increasingly difficult.   Skin: Negative for rash.  Allergic/Immunologic: Negative for environmental allergies.  Neurological: Negative for dizziness, tremors, numbness and headaches.  Hematological: Negative for adenopathy. Does not bruise/bleed easily.  Psychiatric/Behavioral: Negative for behavioral problems (Depression), sleep disturbance and suicidal ideas. The patient is not nervous/anxious.      Today's Vitals   01/30/20 1012  BP: (!) 180/80  Pulse: 75  Resp: 16  Temp: (!) 97.5 F (36.4 C)  SpO2: 99%  Weight: 236 lb (107 kg)  Height: 5\' 7"  (1.702 m)   Body mass index is 36.96 kg/m.  Physical Exam Vitals and nursing note reviewed.  Constitutional:      General: She is not in acute distress.     Appearance: Normal appearance. She is well-developed. She is not diaphoretic.  HENT:     Head: Normocephalic and atraumatic.     Nose: Nose normal.     Mouth/Throat:     Pharynx: No oropharyngeal exudate.  Eyes:     Pupils: Pupils are equal, round, and reactive to light.  Neck:     Thyroid: No thyromegaly.     Vascular: No carotid bruit or JVD.     Trachea: No tracheal deviation.  Cardiovascular:     Rate and Rhythm: Normal rate and regular rhythm.     Pulses: Normal pulses.          Dorsalis pedis pulses are 2+ on the right side and 2+ on the left side.       Posterior tibial pulses are 2+ on the right side and 2+ on the left side.     Heart sounds: Normal heart sounds. No murmur heard.  No friction rub. No gallop.   Pulmonary:  Effort: Pulmonary effort is normal. No respiratory distress.     Breath sounds: Normal breath sounds. No wheezing or rales.  Chest:     Chest wall: No tenderness.     Breasts:        Right: Normal. No swelling, bleeding, inverted nipple, mass, nipple discharge, skin change or tenderness.        Left: Normal. No swelling, bleeding, inverted nipple, mass, nipple discharge, skin change or tenderness.  Abdominal:     General: Bowel sounds are normal.     Palpations: Abdomen is soft.     Tenderness: There is no abdominal tenderness.  Musculoskeletal:        General: Normal range of motion.     Cervical back: Normal range of motion and neck supple.     Right foot: Normal range of motion. No deformity or bunion.     Left foot: Normal range of motion. No deformity or bunion.     Comments: Tenderness in the groin area, bilaterally. No palpable abnormalities or deformities. flexion and extension of the legs at the groin, increases the pain.   Feet:     Right foot:     Protective Sensation: 10 sites tested. 10 sites sensed.     Skin integrity: Skin integrity normal.     Toenail Condition: Right toenails are normal.     Left foot:     Protective Sensation:  10 sites tested. 10 sites sensed.     Skin integrity: Skin integrity normal.     Toenail Condition: Left toenails are normal.  Lymphadenopathy:     Cervical: No cervical adenopathy.     Upper Body:     Right upper body: No axillary adenopathy.     Left upper body: No axillary adenopathy.  Skin:    General: Skin is warm and dry.  Neurological:     Mental Status: She is alert and oriented to person, place, and time. Mental status is at baseline.     Cranial Nerves: No cranial nerve deficit.  Psychiatric:        Mood and Affect: Mood normal.        Behavior: Behavior normal.        Thought Content: Thought content normal.        Judgment: Judgment normal.    Depression screen [facility] 2/9 01/30/2020 10/11/2019 07/04/2019 01/28/2019 08/16/2018  Decreased Interest 0 0 0 0 0  Down, Depressed, Hopeless 0 0 0 0 0  PHQ - 2 Score 0 0 0 0 0    Functional Status Survey: Is the patient deaf or have difficulty hearing?: No Does the patient have difficulty seeing, even when wearing glasses/contacts?: Yes Does the patient have difficulty concentrating, remembering, or making decisions?: No Does the patient have difficulty walking or climbing stairs?: No Does the patient have difficulty dressing or bathing?: No Does the patient have difficulty doing errands alone such as visiting a doctor's office or shopping?: No  MMSE - [place] Exam 01/30/2020 01/28/2019 01/22/2018  Orientation to time 5 5 5   Orientation to Place 5 5 5   Registration 3 3 3   Attention/ Calculation 5 5 5   Recall 3 3 3   Language- name 2 objects 2 2 2   Language- repeat 1 1 1   Language- follow 3 step command 3 3 3   Language- read & follow direction 1 1 1   Write a sentence 1 1 1   Copy design 1 1 1   Total score 30 30 30  Fall Risk  01/30/2020 10/11/2019 07/04/2019 01/28/2019 08/16/2018  Falls in the past year? 0 0 0 0 0     LABS: Recent Results (from the past 2160 hour(s))  UA/M w/rflx Culture, Routine     Status: None    Collection Time: 01/30/20 10:09 AM   Specimen: Urine   Urine  Result Value Ref Range   Specific Gravity, UA 1.005 1.005 - 1.030   pH, UA 6.5 5.0 - 7.5   Color, UA Yellow Yellow   Appearance Ur Clear Clear   Leukocytes,UA Negative Negative   Protein,UA Negative Negative/Trace   Glucose, UA Negative Negative   Ketones, UA Negative Negative   RBC, UA Negative Negative   Bilirubin, UA Negative Negative   Urobilinogen, Ur 0.2 0.2 - 1.0 mg/dL   Nitrite, UA Negative Negative   Microscopic Examination Comment     Comment: Microscopic follows if indicated.   Microscopic Examination See below:     Comment: Microscopic was indicated and was performed.   Urinalysis Reflex Comment     Comment: This specimen will not reflex to a Urine Culture.  Microscopic Examination     Status: Abnormal   Collection Time: 01/30/20 10:09 AM   Urine  Result Value Ref Range   WBC, UA 0-5 0 - 5 /hpf   RBC 3-10 (A) 0 - 2 /hpf   Epithelial Cells (non renal) 0-10 0 - 10 /hpf   Casts None seen None seen /lpf   Bacteria, UA None seen None seen/Few  POCT HgB A1C     Status: Abnormal   Collection Time: 01/30/20 10:52 AM  Result Value Ref Range   Hemoglobin A1C 6.9 (A) 4.0 - 5.6 %   HbA1c POC (<> result, manual entry)     HbA1c, POC (prediabetic range)     HbA1c, POC (controlled diabetic range)      Assessment/Plan:  1. Encounter for general adult medical examination with abnormal findings Annual health maintenance exam today.   2. Resistant hypertension Increase dose HCTZ to 25mg  daily. Increase lsinopril to 40mg  daily. Monitor blood pressure closely at home.  - US Carotid Bilateral; Future - ECHOCARDIOGRAM COMPLETE; Future - lisinopril (ZESTRIL) 20 MG tablet; Take 2 tablets (40 mg total) by mouth daily.  Dispense: 180 tablet; Refill: 1 - hydrochlorothiazide (HYDRODIURIL) 12.5 MG tablet; Take 2 tablets (25 mg total) by mouth daily.  Dispense: 180 tablet; Refill: 3   3. Type 2 diabetes mellitus without  complication, without long-term current use of insulin (HCC) - POCT HgB A1C 6.9 today. Continue to control with diet and exercise. Monitor closely.   4. Hyperlipidemia associated with type 2 diabetes mellitus ([place]) Start crestor 5mg  daily. Will get echocardiogram and carotid doppler studies for further evaluation.  - rosuvastatin (CRESTOR) 5 MG tablet; Take 1 tablet (5 mg total) by mouth daily.  Dispense: 30 tablet; Refill: 3 - US Carotid Bilateral; Future - ECHOCARDIOGRAM COMPLETE; Future  5. Bilateral groin pain Will x-ray both hips for further evaluation. Refer to orthopedics as indicated.  - DG Hip Unilat W OR W/O Pelvis 2-3 Views Right; Future  6. Encounter for screening mammogram for malignant neoplasm of breast - MM DIGITAL SCREENING BILATERAL; Future  7. Dysuria - UA/M w/rflx Culture, Routine  8. Need for vaccination against Streptococcus pneumoniae using pneumococcal conjugate vaccine 13 Prescription for pneumovax vaccine sent to patient's pharmacy for evaluation.  - pneumococcal 23 valent vaccine (PNEUMOVAX 23) 25 MCG/0.5ML injection; Inject 0.67ml IM once  Dispense: 0.5 mL; Refill: 0  General Counseling: tracyann duffell understanding of the findings of todays visit and agrees with plan of treatment. I have discussed any further diagnostic evaluation that may be needed or ordered today. We also reviewed her medications today. she has been encouraged to call the office with any questions or concerns that should arise related to todays visit.    Counseling:  Hypertension Counseling:   The following hypertensive lifestyle modification were recommended and discussed:  1. Limiting alcohol intake to less than 1 oz/day of ethanol:(24 oz of beer or 8 oz of wine or 2 oz of 100-proof whiskey). 2. Take baby ASA 81 mg daily. 3. Importance of regular aerobic exercise and losing weight. 4. Reduce dietary saturated fat and cholesterol intake for overall cardiovascular health. 5.  Maintaining adequate dietary potassium, calcium, and magnesium intake. 6. Regular monitoring of the blood pressure. 7. Reduce sodium intake to less than 100 mmol/day (less than 2.3 gm of sodium or less than 6 gm of sodium choride)   This patient was seen by [place] with Dr Lavera Guise as a part of collaborative care agreement  Orders Placed This Encounter  Procedures  . Microscopic Examination  . MM DIGITAL SCREENING BILATERAL  . US Carotid Bilateral  . DG Hip Unilat W OR W/O Pelvis 2-3 Views Right  . UA/M w/rflx Culture, Routine  . POCT HgB A1C  . ECHOCARDIOGRAM COMPLETE    Meds ordered this encounter  Medications  . rosuvastatin (CRESTOR) 5 MG tablet    Sig: Take 1 tablet (5 mg total) by mouth daily.    Dispense:  30 tablet    Refill:  3    Order Specific Question:   Supervising Provider    Answer:   Lavera Guise [[id number]]  . pneumococcal 23 valent vaccine (PNEUMOVAX 23) 25 MCG/0.5ML injection    Sig: Inject 0.60ml IM once    Dispense:  0.5 mL    Refill:  0    Order Specific Question:   Supervising Provider    Answer:   Lavera Guise [[id number]]  . lisinopril (ZESTRIL) 20 MG tablet    Sig: Take 2 tablets (40 mg total) by mouth daily.    Dispense:  180 tablet    Refill:  1    Increased dose of lisinopril    Order Specific Question:   Supervising Provider    Answer:   Lavera Guise [[id number]]  . hydrochlorothiazide (HYDRODIURIL) 12.5 MG tablet    Sig: Take 2 tablets (25 mg total) by mouth daily.    Dispense:  180 tablet    Refill:  3    Increased dose to 25mg  daily. Updating prescription.    Order Specific Question:   Supervising Provider    Answer:   Lavera Guise [[id number]]    Total time spent: 23 Minutes  Time spent includes review of chart, medications, test results, and follow up plan with the patient.     Lavera Guise, MD  Internal Medicine

## 2020-01-31 LAB — UA/M W/RFLX CULTURE, ROUTINE
Bilirubin, UA: NEGATIVE
Glucose, UA: NEGATIVE
Ketones, UA: NEGATIVE
Leukocytes,UA: NEGATIVE
Nitrite, UA: NEGATIVE
Protein,UA: NEGATIVE
RBC, UA: NEGATIVE
Specific Gravity, UA: 1.005 (ref 1.005–1.030)
Urobilinogen, Ur: 0.2 mg/dL (ref 0.2–1.0)
pH, UA: 6.5 (ref 5.0–7.5)

## 2020-01-31 LAB — MICROSCOPIC EXAMINATION
Bacteria, UA: NONE SEEN
Casts: NONE SEEN /lpf

## 2020-02-06 ENCOUNTER — Other Ambulatory Visit: Payer: Self-pay | Admitting: Nurse Practitioner

## 2020-02-06 DIAGNOSIS — M25552 Pain in left hip: Secondary | ICD-10-CM

## 2020-02-07 ENCOUNTER — Ambulatory Visit
Admission: RE | Admit: 2020-02-07 | Discharge: 2020-02-07 | Disposition: A | Discharge status: Home / Self Care | Payer: PPO | Source: Ambulatory Visit | Attending: Nurse Practitioner | Admitting: Nurse Practitioner

## 2020-02-07 ENCOUNTER — Other Ambulatory Visit: Payer: Self-pay

## 2020-02-07 ENCOUNTER — Ambulatory Visit
Admission: RE | Admit: 2020-02-07 | Discharge: 2020-02-07 | Disposition: A | Discharge status: Home / Self Care | Payer: PPO | Attending: Nurse Practitioner | Admitting: Nurse Practitioner

## 2020-02-07 DIAGNOSIS — R1031 Right lower quadrant pain: Secondary | ICD-10-CM | POA: Insufficient documentation

## 2020-02-07 DIAGNOSIS — R1032 Left lower quadrant pain: Secondary | ICD-10-CM | POA: Insufficient documentation

## 2020-02-07 DIAGNOSIS — M25551 Pain in right hip: Secondary | ICD-10-CM | POA: Diagnosis not present

## 2020-02-07 DIAGNOSIS — M25552 Pain in left hip: Secondary | ICD-10-CM

## 2020-02-07 DIAGNOSIS — M25559 Pain in unspecified hip: Secondary | ICD-10-CM | POA: Diagnosis not present

## 2020-02-15 DIAGNOSIS — R1031 Right lower quadrant pain: Secondary | ICD-10-CM

## 2020-02-15 DIAGNOSIS — Z1231 Encounter for screening mammogram for malignant neoplasm of breast: Secondary | ICD-10-CM | POA: Insufficient documentation

## 2020-02-15 DIAGNOSIS — E785 Hyperlipidemia, unspecified: Secondary | ICD-10-CM | POA: Insufficient documentation

## 2020-02-15 DIAGNOSIS — E1169 Type 2 diabetes mellitus with other specified complication: Secondary | ICD-10-CM | POA: Insufficient documentation

## 2020-02-15 HISTORY — DX: Left lower quadrant pain: R10.31

## 2020-02-20 DIAGNOSIS — H2513 Age-related nuclear cataract, bilateral: Secondary | ICD-10-CM | POA: Diagnosis not present

## 2020-02-20 DIAGNOSIS — H401131 Primary open-angle glaucoma, bilateral, mild stage: Secondary | ICD-10-CM | POA: Diagnosis not present

## 2020-03-02 ENCOUNTER — Ambulatory Visit: Payer: PPO

## 2020-03-02 ENCOUNTER — Other Ambulatory Visit: Payer: Self-pay

## 2020-03-02 DIAGNOSIS — E785 Hyperlipidemia, unspecified: Secondary | ICD-10-CM

## 2020-03-02 DIAGNOSIS — I6523 Occlusion and stenosis of bilateral carotid arteries: Secondary | ICD-10-CM

## 2020-03-02 DIAGNOSIS — I10 Essential (primary) hypertension: Secondary | ICD-10-CM | POA: Diagnosis not present

## 2020-03-02 DIAGNOSIS — E1169 Type 2 diabetes mellitus with other specified complication: Secondary | ICD-10-CM | POA: Diagnosis not present

## 2020-03-09 ENCOUNTER — Other Ambulatory Visit: Payer: Self-pay

## 2020-03-09 ENCOUNTER — Ambulatory Visit: Payer: PPO

## 2020-03-09 DIAGNOSIS — I10 Essential (primary) hypertension: Secondary | ICD-10-CM

## 2020-03-09 DIAGNOSIS — R0602 Shortness of breath: Secondary | ICD-10-CM

## 2020-03-09 DIAGNOSIS — E1169 Type 2 diabetes mellitus with other specified complication: Secondary | ICD-10-CM

## 2020-03-09 NOTE — Procedures (Addendum)
[place], [place] [id number]  DATE OF SERVICE: March 02, 2020  CAROTID DOPPLER INTERPRETATION:  Bilateral Carotid Ultrsasound and Color Doppler Examination was performed. The RIGHT CCA shows mild to moderate plaque with right internal carotid artery being occluded mid to distal section. The LEFT CCA shows mild plaque in the vessel. There was no significant intimal thickening noted in the RIGHT carotid artery. There was no significant intimal thickening in the LEFT carotid artery.  The RIGHT CCA shows peak systolic velocity of 387 cm per second. The end diastolic velocity is 22 cm per second on the RIGHT side. The RIGHT ICA shows peak systolic velocity of 41 per second. RIGHT sided ICA end diastolic velocity is 15 cm per second. The RIGHT ECA shows a peak systolic velocity of 564 cm per second. The ICA/CCA ratio is calculated to be 0.3. This suggests less than 50% stenosis however the mid to distal internal carotid artery is fully occluded.. The Vertebral Artery shows antegrade flow.  The LEFT CCA shows peak systolic velocity of 332 cm per second. The end diastolic velocity is 36 cm per second on the LEFT side. The LEFT ICA shows peak systolic velocity of 80 per second. LEFT sided ICA end diastolic velocity is 22 cm per second. The LEFT ECA shows a peak systolic velocity of 951 cm per second. The ICA/CCA ratio is calculated to be 0.7. This suggests less than 50% stenosis. The Vertebral Artery shows antegrade flow.   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 The LEFT CAROTID shows less than 50% stenosis.  There is mild plaque formation noted on the LEFT and mild plaque with totally occluded internal carotid artery on the RIGHT  side. Consider a repeat Carotid doppler if clinical situation and symptoms warrant in 6-12 months. Patient should be encouraged to change lifestyles such as smoking  cessation, regular exercise and dietary modification. Use of statins in the right clinical setting and ASA is encouraged.  Allyne Gee, MD [facility] Pulmonary Critical Care Medicine

## 2020-03-11 NOTE — Progress Notes (Signed)
Will need immediate referral to vein and vascular. Discuss at visit 10/26

## 2020-03-12 DIAGNOSIS — H2511 Age-related nuclear cataract, right eye: Secondary | ICD-10-CM | POA: Diagnosis not present

## 2020-03-12 DIAGNOSIS — I10 Essential (primary) hypertension: Secondary | ICD-10-CM | POA: Diagnosis not present

## 2020-03-13 ENCOUNTER — Encounter: Payer: Self-pay | Admitting: Nurse Practitioner

## 2020-03-13 ENCOUNTER — Ambulatory Visit (INDEPENDENT_AMBULATORY_CARE_PROVIDER_SITE_OTHER): Payer: PPO | Admitting: Nurse Practitioner

## 2020-03-13 ENCOUNTER — Other Ambulatory Visit: Payer: Self-pay

## 2020-03-13 VITALS — BP 142/82 | HR 68 | Temp 97.4°F | Resp 16 | Ht 67.0 in | Wt 237.6 lb

## 2020-03-13 DIAGNOSIS — E1169 Type 2 diabetes mellitus with other specified complication: Secondary | ICD-10-CM

## 2020-03-13 DIAGNOSIS — I5189 Other ill-defined heart diseases: Secondary | ICD-10-CM | POA: Diagnosis not present

## 2020-03-13 DIAGNOSIS — E119 Type 2 diabetes mellitus without complications: Secondary | ICD-10-CM

## 2020-03-13 DIAGNOSIS — E785 Hyperlipidemia, unspecified: Secondary | ICD-10-CM

## 2020-03-13 DIAGNOSIS — I6521 Occlusion and stenosis of right carotid artery: Secondary | ICD-10-CM

## 2020-03-13 NOTE — Progress Notes (Signed)
[facility] [place],  [address]  Internal MEDICINE  Office Visit Note  Patient Name: Holly Forbes  [id number]  [id number]  Date of Service: 03/13/2020  Chief Complaint  Patient presents with  . Follow-up  . Gastroesophageal Reflux  . Hypertension  . Quality Metric Gaps    flu,colonoscopy,tetnaus  . controlled substance form    reviewed with PT    The patient is here for follow up visit. 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 There is mild plaque on left side with occluded right carotid. She is currently on crestor 5mg  and aspirin 81mg  daily. Echocardiogram shows normal LVEF with trace valvular regurgitation. She has diastolic dysfunction. She denies chest pain, chest pressure, headache, or shortness of breath.       Current Medication: Outpatient Encounter Medications as of 03/13/2020  Medication Sig  . aspirin 81 MG tablet Take 81 mg by mouth daily. am  . dorzolamide-timolol (COSOPT) 22.3-6.8 MG/ML ophthalmic solution INSTILL 1 DROP INTO BOTH EYES QHS  . hydrochlorothiazide (HYDRODIURIL) 12.5 MG tablet Take 2 tablets (25 mg total) by mouth daily.  Marland Kitchen ibuprofen (ADVIL) 400 MG tablet Take 1 tablet (400 mg total) by mouth 2 (two) times daily as needed.  . latanoprost (XALATAN) 0.005 % ophthalmic solution Place 1 drop into both eyes.   Marland Kitchen lisinopril (ZESTRIL) 20 MG tablet Take 2 tablets (40 mg total) by mouth daily.  . meloxicam (MOBIC) 15 MG tablet Take 1 tablet (15 mg total) by mouth daily.  . pantoprazole (PROTONIX) 20 MG tablet Take 1 tablet (20 mg total) by mouth daily.  . pneumococcal 23 valent vaccine (PNEUMOVAX 23) 25 MCG/0.5ML injection Inject 0.10ml IM once  . rosuvastatin (CRESTOR) 5 MG tablet Take 1 tablet (5 mg total) by mouth daily.  Marland Kitchen VITAMIN D PO Take by mouth daily.  Marland Kitchen VITAMIN E PO  Take by mouth daily.   Facility-Administered Encounter Medications as of 03/13/2020  Medication  . triamcinolone acetonide (KENALOG) 10 MG/ML injection 10 mg    Surgical History: Past Surgical History:  Procedure Laterality Date  . ABDOMINAL HYSTERECTOMY    . BREAST EXCISIONAL BIOPSY Left 2015   cyst removed  . CHOLECYSTECTOMY    . COLONOSCOPY N/A 09/25/2014   Procedure: COLONOSCOPY;  Surgeon: Lucilla Lame, MD;  Location: [place];  Service: Gastroenterology;  Laterality: N/A;  cecum time- 1517  . CYST REMOVAL NECK    . ESOPHAGOGASTRODUODENOSCOPY N/A 09/25/2014   Procedure: ESOPHAGOGASTRODUODENOSCOPY (EGD);  Surgeon: Lucilla Lame, MD;  Location: [place];  Service: Gastroenterology;  Laterality: N/A;  . ESOPHAGOGASTRODUODENOSCOPY (EGD) WITH PROPOFOL N/A 04/07/2018   Procedure: ESOPHAGOGASTRODUODENOSCOPY (EGD) WITH PROPOFOL;  Surgeon: Toledo, Benay Pike, MD;  Location: [facility];  Service: Gastroenterology;  Laterality: N/A;  . POLYPECTOMY  09/25/2014   Procedure: POLYPECTOMY INTESTINAL;  Surgeon: Lucilla Lame, MD;  Location: [place];  Service: Gastroenterology;;    Medical History: Past Medical History:  Diagnosis Date  . Abdominal pain   . 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 Back pain    Left sciatica, low back pain  . GERD (gastroesophageal reflux disease)   . Glaucoma   . Hypertension   . Vitamin D deficiency     Family History: Family History  Problem Relation Age of Onset  . Heart disease Sister   . Diabetes Sister   .  Hypertension Sister   . Heart disease Brother   . Diabetes Brother   . Hypertension Brother   . Breast cancer Daughter 31  . Colon cancer Mother   . Stroke Mother   . Lung cancer Father     Social History   Socioeconomic History  . Marital status: Married    Spouse name: Not on file  . Number of children: Not on file  . Years of education: Not on file  . Highest education level: Not on file   Occupational History  . Not on file  Tobacco Use  . Smoking status: Former Smoker    Packs/day: 0.25    Years: 15.00    Pack years: 3.75    Types: Cigarettes  . Smokeless tobacco: Never Used  Vaping Use  . Vaping Use: Never used  Substance and Sexual Activity  . Alcohol use: Yes    Comment: 3-5 glasses wine/month  . Drug use: Not Currently  . Sexual activity: Not on file  Other Topics Concern  . Not on file  Social History Narrative  . Not on file   Social Determinants of Health   Financial Resource Strain:   . Difficulty of Paying Living Expenses: Not on file  Food Insecurity:   . Worried About Charity fundraiser in the Last Year: Not on file  . Ran Out of Food in the Last Year: Not on file  Transportation Needs:   . Lack of Transportation (Medical): Not on file  . Lack of Transportation (Non-Medical): Not on file  Physical Activity:   . Days of Exercise per Week: Not on file  . Minutes of Exercise per Session: Not on file  Stress:   . Feeling of Stress : Not on file  Social Connections:   . Frequency of Communication with Friends and Family: Not on file  . Frequency of Social Gatherings with Friends and Family: Not on file  . Attends Religious Services: Not on file  . Active Member of Clubs or Organizations: Not on file  . Attends Archivist Meetings: Not on file  . Marital Status: Not on file  Intimate Partner Violence:   . Fear of Current or Ex-Partner: Not on file  . Emotionally Abused: Not on file  . Physically Abused: Not on file  . Sexually Abused: Not on file      Review of Systems  Constitutional: Negative for activity change, chills, fatigue and unexpected weight change.  HENT: Negative for congestion, postnasal drip, rhinorrhea, sneezing and sore throat.   Respiratory: Negative for cough, chest tightness, shortness of breath and wheezing.   Cardiovascular: Negative for chest pain and palpitations.  Gastrointestinal: Negative for  abdominal pain, constipation, diarrhea, nausea and vomiting.  Endocrine: Negative for cold intolerance, heat intolerance, polydipsia and polyuria.  Musculoskeletal: Positive for neck pain and neck stiffness. Negative for arthralgias, back pain and joint swelling.  Skin: Negative for rash.  Allergic/Immunologic: Negative for environmental allergies.  Neurological: Negative for dizziness, tremors, numbness and headaches.  Hematological: Negative for adenopathy. Does not bruise/bleed easily.  Psychiatric/Behavioral: Negative for behavioral problems (Depression), sleep disturbance and suicidal ideas. The patient is not nervous/anxious.     Today's Vitals   03/13/20 1346  BP: (!) 142/82  Pulse: 68  Resp: 16  Temp: (!) 97.4 F (36.3 C)  SpO2: 98%  Weight: 237 lb 9.6 oz (107.8 kg)  Height: 5\' 7"  (1.702 m)   Body mass index is 37.21 kg/m.  Physical Exam Vitals and  nursing note reviewed.  Constitutional:      General: She is not in acute distress.    Appearance: Normal appearance. She is well-developed. She is not diaphoretic.  HENT:     Head: Normocephalic and atraumatic.     Nose: Nose normal.     Mouth/Throat:     Pharynx: No oropharyngeal exudate.  Eyes:     Pupils: Pupils are equal, round, and reactive to light.  Neck:     Thyroid: No thyromegaly.     Vascular: No JVD.     Trachea: No tracheal deviation.     Comments: Carotid sounds diminished on right side. No bruit auscultated  Cardiovascular:     Rate and Rhythm: Normal rate and regular rhythm.     Heart sounds: Normal heart sounds. No murmur heard.  No friction rub. No gallop.   Pulmonary:     Effort: Pulmonary effort is normal. No respiratory distress.     Breath sounds: Normal breath sounds. No wheezing or rales.  Chest:     Chest wall: No tenderness.  Abdominal:     Palpations: Abdomen is soft.  Musculoskeletal:        General: Normal range of motion.     Cervical back: Normal range of motion and neck supple.   Lymphadenopathy:     Cervical: No cervical adenopathy.  Skin:    General: Skin is warm and dry.  Neurological:     General: No focal deficit present.     Mental Status: She is alert and oriented to person, place, and time.     Cranial Nerves: No cranial nerve deficit.  Psychiatric:        Mood and Affect: Mood normal.        Behavior: Behavior normal.        Thought Content: Thought content normal.        Judgment: Judgment normal.    Assessment/Plan: 1. 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 Will get urgent CTA of the neck for further evaluation. Refer to vascular surgery as indicated  - CT ANGIO NECK W OR WO CONTRAST; Future  2. Diastolic dysfunction Echo showing normal LVEF with trace valvular regurgitation. Diastolic dysfunction present. Will get sleep study for further evaluation.  - Home sleep test  3. Type 2 diabetes mellitus without complication, without long-term current use of insulin (HCC) Continue to monitor closely  4. Hyperlipidemia associated with type 2 diabetes mellitus ([place]) Continue crestor as prescribed   General Counseling: Nyasha verbalizes understanding of the findings of todays visit and agrees with plan of treatment. I have discussed any further diagnostic evaluation that may be needed or ordered today. We also reviewed her medications today. she has been encouraged to call the office with any questions or concerns that should arise related to todays visit.  This patient was seen by Leretha Pol FNP Collaboration with Dr Lavera Guise as a part of collaborative care agreement  Orders Placed This Encounter  Procedures  . CT ANGIO NECK W OR WO CONTRAST  . Home sleep test     Total time spent: 30 Minutes   Time spent includes review of chart, medications, test results, and follow up plan with the patient.      Dr Lavera Guise Internal  medicine

## 2020-03-14 NOTE — Progress Notes (Signed)
An urgent CTA neck was ordered. Patient to follow up with referral to follow as indicated.

## 2020-03-15 ENCOUNTER — Ambulatory Visit
Admission: RE | Admit: 2020-03-15 | Discharge: 2020-03-15 | Disposition: A | Discharge status: Home / Self Care | Payer: PPO | Source: Ambulatory Visit | Attending: Nurse Practitioner | Admitting: Nurse Practitioner

## 2020-03-15 ENCOUNTER — Other Ambulatory Visit: Payer: Self-pay

## 2020-03-15 DIAGNOSIS — I6521 Occlusion and stenosis of right carotid artery: Secondary | ICD-10-CM | POA: Insufficient documentation

## 2020-03-15 DIAGNOSIS — I5189 Other ill-defined heart diseases: Secondary | ICD-10-CM

## 2020-03-15 LAB — POCT I-STAT CREATININE: Creatinine, Ser: 0.8 mg/dL (ref 0.44–1.00)

## 2020-03-15 MED ORDER — IOHEXOL 350 MG/ML SOLN
75.0000 mL | Freq: Once | INTRAVENOUS | Status: AC | PRN
  Administered 2020-03-15: 75 mL via INTRAVENOUS

## 2020-03-16 NOTE — Progress Notes (Signed)
CTA showing no evidence of carotid artery stenosis. Discuss with patient at next visit .

## 2020-03-21 ENCOUNTER — Other Ambulatory Visit: Payer: Self-pay

## 2020-03-21 ENCOUNTER — Ambulatory Visit: Payer: PPO | Admitting: Internal Medicine

## 2020-03-21 DIAGNOSIS — G4733 Obstructive sleep apnea (adult) (pediatric): Secondary | ICD-10-CM

## 2020-03-22 ENCOUNTER — Other Ambulatory Visit: Payer: Self-pay

## 2020-03-22 ENCOUNTER — Encounter: Payer: Self-pay | Admitting: Ophthalmology

## 2020-03-22 ENCOUNTER — Ambulatory Visit: Admit: 2020-03-22 | Payer: PPO | Admitting: Ophthalmology

## 2020-03-22 SURGERY — PHACOEMULSIFICATION, CATARACT, WITH IOL INSERTION
Anesthesia: Topical | Laterality: Right

## 2020-03-23 ENCOUNTER — Ambulatory Visit (INDEPENDENT_AMBULATORY_CARE_PROVIDER_SITE_OTHER): Payer: PPO | Admitting: Nurse Practitioner

## 2020-03-23 ENCOUNTER — Other Ambulatory Visit
Admission: RE | Admit: 2020-03-23 | Discharge: 2020-03-23 | Disposition: A | Discharge status: Home / Self Care | Payer: PPO | Source: Ambulatory Visit | Attending: Ophthalmology | Admitting: Ophthalmology

## 2020-03-23 ENCOUNTER — Encounter: Payer: Self-pay | Admitting: Nurse Practitioner

## 2020-03-23 VITALS — BP 162/85 | HR 69 | Resp 16 | Ht 67.0 in | Wt 236.0 lb

## 2020-03-23 DIAGNOSIS — I6521 Occlusion and stenosis of right carotid artery: Secondary | ICD-10-CM

## 2020-03-23 DIAGNOSIS — I10 Essential (primary) hypertension: Secondary | ICD-10-CM | POA: Diagnosis not present

## 2020-03-23 DIAGNOSIS — Z01812 Encounter for preprocedural laboratory examination: Secondary | ICD-10-CM | POA: Diagnosis not present

## 2020-03-23 DIAGNOSIS — Z20822 Contact with and (suspected) exposure to covid-19: Secondary | ICD-10-CM | POA: Insufficient documentation

## 2020-03-23 NOTE — Discharge Instructions (Signed)

## 2020-03-23 NOTE — Progress Notes (Signed)
[facility] [place], [place] [address]  Internal MEDICINE  Telephone Visit  Patient Name: Holly Forbes  [id number]  [id number]  Date of Service: 03/23/2020  I connected with the patient at 12:29pm by telephone and verified the patients identity using two identifiers.   I discussed the limitations, risks, security and privacy concerns of performing an evaluation and management service by telephone and the availability of in person appointments. I also discussed with the patient that there may be a patient responsible charge related to the service.  The patient expressed understanding and agrees to proceed.    Chief Complaint  Patient presents with  . Follow-up  . Telephone Assessment    [phone]  . Telephone Screen    phone call  . Hypertension    The patient has been contacted via telephone for follow up visit due to concerns for spread of novel coronavirus.  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 There is mild plaque on left side with occluded right carotid. She has now had CTA neck on 03/15/2020. This showed No occlusion or hemodynamically significant stenosis. Specifically, the right internal carotid is not occluded. The patient states that she feels well and has no new concerns or complaints. She did have home sleep study, however results are not currently available.         Current Medication: Outpatient Encounter Medications as of 03/23/2020  Medication Sig  . aspirin 81 MG tablet Take 81 mg by mouth daily. am  . Cyanocobalamin (VITAMIN B-12 PO) Take by mouth daily.  . dorzolamide-timolol (COSOPT) 22.3-6.8 MG/ML ophthalmic solution INSTILL 1 DROP INTO BOTH EYES QHS  . hydrochlorothiazide (HYDRODIURIL) 12.5 MG tablet Take 2 tablets (25 mg total) by mouth daily.  Marland Kitchen ibuprofen (ADVIL) 400 MG tablet Take 1 tablet (400 mg total) by  mouth 2 (two) times daily as needed.  . latanoprost (XALATAN) 0.005 % ophthalmic solution Place 1 drop into both eyes.   Marland Kitchen lisinopril (ZESTRIL) 20 MG tablet Take 2 tablets (40 mg total) by mouth daily.  . pantoprazole (PROTONIX) 20 MG tablet Take 1 tablet (20 mg total) by mouth daily.  . rosuvastatin (CRESTOR) 5 MG tablet Take 1 tablet (5 mg total) by mouth daily.  Marland Kitchen VITAMIN D PO Take by mouth daily.   . pneumococcal 23 valent vaccine (PNEUMOVAX 23) 25 MCG/0.5ML injection Inject 0.18ml IM once (Patient not taking: Reported on 03/23/2020)  . [DISCONTINUED] meloxicam (MOBIC) 15 MG tablet Take 1 tablet (15 mg total) by mouth daily. (Patient not taking: Reported on 03/22/2020)   Facility-Administered Encounter Medications as of 03/23/2020  Medication  . triamcinolone acetonide (KENALOG) 10 MG/ML injection 10 mg    Surgical History: Past Surgical History:  Procedure Laterality Date  . ABDOMINAL HYSTERECTOMY    . BREAST EXCISIONAL BIOPSY Left 2015   cyst removed  . CHOLECYSTECTOMY    . COLONOSCOPY N/A 09/25/2014   Procedure: COLONOSCOPY;  Surgeon: Lucilla Lame, MD;  Location: [place];  Service: Gastroenterology;  Laterality: N/A;  cecum time- 3570  . CYST REMOVAL NECK    . ESOPHAGOGASTRODUODENOSCOPY N/A 09/25/2014   Procedure: ESOPHAGOGASTRODUODENOSCOPY (EGD);  Surgeon: Lucilla Lame, MD;  Location: [place];  Service: Gastroenterology;  Laterality: N/A;  . ESOPHAGOGASTRODUODENOSCOPY (EGD) WITH PROPOFOL N/A 04/07/2018   Procedure: ESOPHAGOGASTRODUODENOSCOPY (EGD) WITH PROPOFOL;  Surgeon: Toledo, Benay Pike, MD;  Location: [facility];  Service: Gastroenterology;  Laterality: N/A;  . POLYPECTOMY  09/25/2014   Procedure: POLYPECTOMY INTESTINAL;  Surgeon: Lucilla Lame, MD;  Location: [place];  Service: Gastroenterology;;    Medical History: Past Medical History:  Diagnosis Date  . Abdominal pain   . 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 Back pain    Left  sciatica, low back pain  . GERD (gastroesophageal reflux disease)   . Glaucoma   . Hypertension   . Vitamin D deficiency     Family History: Family History  Problem Relation Age of Onset  . Heart disease Sister   . Diabetes Sister   . Hypertension Sister   . Heart disease Brother   . Diabetes Brother   . Hypertension Brother   . Breast cancer Daughter 79  . Colon cancer Mother   . Stroke Mother   . Lung cancer Father     Social History   Socioeconomic History  . Marital status: Married    Spouse name: Not on file  . Number of children: Not on file  . Years of education: Not on file  . Highest education level: Not on file  Occupational History  . Not on file  Tobacco Use  . Smoking status: Former Smoker    Packs/day: 0.25    Years: 15.00    Pack years: 3.75    Types: Cigarettes    Quit date: 1995    Years since quitting: 26.8  . Smokeless tobacco: Never Used  Vaping Use  . Vaping Use: Never used  Substance and Sexual Activity  . Alcohol use: Yes    Comment: 3-5 glasses wine/month  . Drug use: Not Currently  . Sexual activity: Not on file  Other Topics Concern  . Not on file  Social History Narrative  . Not on file   Social Determinants of Health   Financial Resource Strain:   . Difficulty of Paying Living Expenses: Not on file  Food Insecurity:   . Worried About Charity fundraiser in the Last Year: Not on file  . Ran Out of Food in the Last Year: Not on file  Transportation Needs:   . Lack of Transportation (Medical): Not on file  . Lack of Transportation (Non-Medical): Not on file  Physical Activity:   . Days of Exercise per Week: Not on file  . Minutes of Exercise per Session: Not on file  Stress:   . Feeling of Stress : Not on file  Social Connections:   . Frequency of Communication with Friends and Family: Not on file  . Frequency of Social Gatherings with Friends and Family: Not on file  . Attends Religious Services: Not on file  . Active  Member of Clubs or Organizations: Not on file  . Attends Archivist Meetings: Not on file  . Marital Status: Not on file  Intimate Partner Violence:   . Fear of Current or Ex-Partner: Not on file  . Emotionally Abused: Not on file  . Physically Abused: Not on file  . Sexually Abused: Not on file      Review of Systems  Constitutional: Negative for activity change, chills, fatigue and unexpected weight change.  HENT: Negative for congestion, postnasal drip, rhinorrhea, sneezing and sore throat.   Respiratory: Negative for cough, chest tightness, shortness of breath and wheezing.   Cardiovascular: Negative for chest pain and palpitations.       Blood pressure slightly elevated today  Gastrointestinal: Negative for abdominal pain, constipation,  diarrhea, nausea and vomiting.  Endocrine: Negative for cold intolerance, heat intolerance, polydipsia and polyuria.  Musculoskeletal: Positive for neck pain and neck stiffness. Negative for arthralgias, back pain and joint swelling.  Skin: Negative for rash.  Allergic/Immunologic: Negative for environmental allergies.  Neurological: Negative for dizziness, tremors, numbness and headaches.  Hematological: Negative for adenopathy. Does not bruise/bleed easily.  Psychiatric/Behavioral: Negative for behavioral problems (Depression), sleep disturbance and suicidal ideas. The patient is not nervous/anxious.     Today's Vitals   03/23/20 1147  BP: (!) 162/85  Pulse: 69  Resp: 16  Weight: 236 lb (107 kg)  Height: 5\' 7"  (1.702 m)   Body mass index is 36.96 kg/m.  Observation/Objective:   The patient is alert and oriented. She is pleasant and answers all questions appropriately. Breathing is non-labored. She is in no acute distress at this time.    Assessment/Plan: 1. Stenosis of right carotid artery Reviewed results of CTA neck with the patient. This showed No occlusion or hemodynamically significant stenosis. Specifically, the  right internal carotid is not occluded. She should continue treatment with crestor and baby aspirin. Will monnitor  2. Essential hypertension Generally stable. Continue bp medication as prescribed   General Counseling: saran laviolette understanding of the findings of today's phone visit and agrees with plan of treatment. I have discussed any further diagnostic evaluation that may be needed or ordered today. We also reviewed her medications today. she has been encouraged to call the office with any questions or concerns that should arise related to todays visit.   This patient was seen by Leretha Pol FNP Collaboration with Dr Lavera Guise as a part of collaborative care agreement   Time spent: 25 Minutes    Dr Lavera Guise Internal medicine

## 2020-03-24 LAB — SARS CORONAVIRUS 2 (TAT 6-24 HRS): SARS Coronavirus 2: NEGATIVE

## 2020-03-25 NOTE — Procedures (Signed)
[facility] [place], [place] [address]  Sleep Specialist: Allyne Gee, MD [place] Sleep Study Interpretation  Patient Name: Holly Forbes Patient MR [id number] DOB:June 21, 1950  Date of Study: March 21, 2020  Indications for study: Hypersomnia sleep apnea  BMI: 37.1 kg/m       Respiratory Data:  Total AHI: 5.3/h  Total Obstructive Apneas:4.0  Total Central Apneas: 3.0  Total Mixed Apneas: 0.0  Total Hypopneas: 33  If the AHI is greater than 5 per hour patient qualifies for PAP evaluation  Oximetry Data:  Oxygen Desaturation Index: 6.7  Lowest Desaturation: 82%  Cardiac Data:  Minimum Heart Rate: 53  Maximum Heart Rate: 121   Impression / Diagnosis:  This apnea study is consistent with mild obstructive sleep apnea.  Patient did have significant oxygen desaturation with lowest saturation of 82%.  There is also some tachycardia noted clinical correlation is recommended.  GENERAL Recommendations:  1.  Consider Auto PAP with pressure ranges 5-20 cmH20 with download, or facility based PAP Titration Study  2.  Consider PAP interface mask fitted for patient comfort, Heated Humidification & PAP compliance monitoring (1 month, 3 months & 12 months after PAP initiation)  3. Consider treatment with mandibular advancement splint (MAS) or referral to an ENT surgeon for modification to the upper airway if the patient prefers an alternate therapy or the PAP trial is unsuccessful  4. Sleep hygiene measures should be discussed with the patient  5. Behavioral therapy such as weight reduction or smoking cessation as appropriate for the patient  6. Advise patient against the use of alcohol or sedatives in so much as these substances can worsen excessive daytime sleepiness and respiratory disturbances of sleep  7. Advise patient against participating in potentially dangerous activities while drowsy such as operating a motor  vehicle, heavy equipment or power tools as it can put them and others in danger  8. Advise patient of the long term consequences of OSA if left untreated, need for treatment and close follow up  9. Clinical follow up as deemed necessary     This Level III home sleep study was performed using the [employer], a 4 channel screening device subject to limitations. Depending on actual total sleep time, not measured in this study, the AHI (sum of apneas and hypopneas/hr of sleep) and therefore the severity of sleep apnea may be underestimated. As with any single night study, including Level 1 attended PSG, severity of sleep apnea may also be underestimated due to the lack of supine and/or REM sleep.  The interpretation associated with this report is based on normal values and degrees of severity in accordance with AASM parameters and/or estimated from multiple sources in the literature for adults ages 18-80+. These may not agree with the displayed values. The patient's treating physician should use the interpretation and recommendations in conjunction with the overall clinical evaluation and treatment of the patient.  Some of the terminology used in this scored [facility] report was developed several years ago and may not always be in accordance with current nomenclature. This in no way affects the accuracy of the data or the reliability of the interpretation and recommendations.

## 2020-03-26 ENCOUNTER — Ambulatory Visit: Payer: PPO | Admitting: Internal Medicine

## 2020-03-27 ENCOUNTER — Encounter: Payer: Self-pay | Admitting: Ophthalmology

## 2020-03-27 ENCOUNTER — Ambulatory Visit
Admission: RE | Admit: 2020-03-27 | Discharge: 2020-03-27 | Disposition: A | Discharge status: Home / Self Care | Payer: PPO | Attending: Ophthalmology | Admitting: Ophthalmology

## 2020-03-27 ENCOUNTER — Ambulatory Visit: Payer: PPO | Admitting: Anesthesiology

## 2020-03-27 ENCOUNTER — Encounter
Admission: RE | Disposition: A | Discharge status: Home / Self Care | Payer: Self-pay | Source: Home / Self Care | Attending: Ophthalmology

## 2020-03-27 ENCOUNTER — Other Ambulatory Visit: Payer: Self-pay

## 2020-03-27 DIAGNOSIS — H25811 Combined forms of age-related cataract, right eye: Secondary | ICD-10-CM | POA: Diagnosis not present

## 2020-03-27 DIAGNOSIS — H2511 Age-related nuclear cataract, right eye: Secondary | ICD-10-CM | POA: Diagnosis not present

## 2020-03-27 DIAGNOSIS — Z79899 Other long term (current) drug therapy: Secondary | ICD-10-CM | POA: Insufficient documentation

## 2020-03-27 DIAGNOSIS — Z87891 Personal history of nicotine dependence: Secondary | ICD-10-CM | POA: Insufficient documentation

## 2020-03-27 HISTORY — PX: CATARACT EXTRACTION W/PHACO: SHX586

## 2020-03-27 SURGERY — PHACOEMULSIFICATION, CATARACT, WITH IOL INSERTION
Anesthesia: Monitor Anesthesia Care | Site: Eye | Laterality: Right

## 2020-03-27 MED ORDER — NA HYALUR & NA CHOND-NA HYALUR 0.4-0.35 ML IO KIT
PACK | INTRAOCULAR | Status: DC | PRN
  Administered 2020-03-27: 1 mL via INTRAOCULAR

## 2020-03-27 MED ORDER — ACETAMINOPHEN 325 MG PO TABS: 325.0000 mg | ORAL_TABLET | Freq: Once | ORAL | Status: DC

## 2020-03-27 MED ORDER — TETRACAINE HCL 0.5 % OP SOLN
1.0000 [drp] | OPHTHALMIC | Status: DC | PRN
  Administered 2020-03-27 (×3): 1 [drp] via OPHTHALMIC

## 2020-03-27 MED ORDER — BRIMONIDINE TARTRATE-TIMOLOL 0.2-0.5 % OP SOLN
OPHTHALMIC | Status: DC | PRN
  Administered 2020-03-27: 1 [drp] via OPHTHALMIC

## 2020-03-27 MED ORDER — FENTANYL CITRATE (PF) 100 MCG/2ML IJ SOLN
INTRAMUSCULAR | Status: DC | PRN
  Administered 2020-03-27: 50 ug via INTRAVENOUS

## 2020-03-27 MED ORDER — MOXIFLOXACIN HCL 0.5 % OP SOLN
1.0000 [drp] | OPHTHALMIC | Status: DC | PRN
  Administered 2020-03-27 (×3): 1 [drp] via OPHTHALMIC

## 2020-03-27 MED ORDER — MIDAZOLAM HCL 2 MG/2ML IJ SOLN
INTRAMUSCULAR | Status: DC | PRN
  Administered 2020-03-27 (×2): 1 mg via INTRAVENOUS

## 2020-03-27 MED ORDER — CEFUROXIME OPHTHALMIC INJECTION 1 MG/0.1 ML
INJECTION | OPHTHALMIC | Status: DC | PRN
  Administered 2020-03-27: 0.1 mL via INTRACAMERAL

## 2020-03-27 MED ORDER — ARMC OPHTHALMIC DILATING DROPS
1.0000 "application " | OPHTHALMIC | Status: DC | PRN
  Administered 2020-03-27 (×3): 1 via OPHTHALMIC

## 2020-03-27 MED ORDER — ACETAMINOPHEN 160 MG/5ML PO SOLN: 325.0000 mg | Freq: Once | ORAL | Status: DC

## 2020-03-27 MED ORDER — LACTATED RINGERS IV SOLN: INTRAVENOUS | Status: DC

## 2020-03-27 MED ORDER — EPINEPHRINE PF 1 MG/ML IJ SOLN
INTRAOCULAR | Status: DC | PRN
  Administered 2020-03-27: 56 mL via OPHTHALMIC

## 2020-03-27 MED ORDER — LIDOCAINE HCL (PF) 2 % IJ SOLN
INTRAOCULAR | Status: DC | PRN
  Administered 2020-03-27: 1 mL

## 2020-03-27 SURGICAL SUPPLY — 22 items
CANNULA ANT/CHMB 27G (MISCELLANEOUS) ×1 IMPLANT
CANNULA ANT/CHMB 27GA (MISCELLANEOUS) ×2 IMPLANT
GLOVE SURG LX 7.5 STRW (GLOVE) ×1
GLOVE SURG LX STRL 7.5 STRW (GLOVE) ×1 IMPLANT
GLOVE SURG TRIUMPH 8.0 PF LTX (GLOVE) ×2 IMPLANT
GOWN STRL REUS W/ TWL LRG LVL3 (GOWN DISPOSABLE) ×2 IMPLANT
GOWN STRL REUS W/TWL LRG LVL3 (GOWN DISPOSABLE) ×4
LENS IOL TECNIS EYHANCE 18.0 (Intraocular Lens) ×1 IMPLANT
MARKER SKIN DUAL TIP RULER LAB (MISCELLANEOUS) ×2 IMPLANT
NDL CAPSULORHEX 25GA (NEEDLE) ×1 IMPLANT
NDL FILTER BLUNT 18X1 1/2 (NEEDLE) ×2 IMPLANT
NEEDLE CAPSULORHEX 25GA (NEEDLE) ×2 IMPLANT
NEEDLE FILTER BLUNT 18X 1/2SAF (NEEDLE) ×2
NEEDLE FILTER BLUNT 18X1 1/2 (NEEDLE) ×2 IMPLANT
PACK CATARACT BRASINGTON (MISCELLANEOUS) ×2 IMPLANT
PACK EYE AFTER SURG (MISCELLANEOUS) ×2 IMPLANT
PACK OPTHALMIC (MISCELLANEOUS) ×2 IMPLANT
SOLUTION OPHTHALMIC SALT (MISCELLANEOUS) ×2 IMPLANT
SYR 3ML LL SCALE MARK (SYRINGE) ×4 IMPLANT
SYR TB 1ML LUER SLIP (SYRINGE) ×2 IMPLANT
WATER STERILE IRR 250ML POUR (IV SOLUTION) ×2 IMPLANT
WIPE NON LINTING 3.25X3.25 (MISCELLANEOUS) ×2 IMPLANT

## 2020-03-27 NOTE — Op Note (Signed)
LOCATION:  [place]   PREOPERATIVE DIAGNOSIS:    Nuclear sclerotic cataract right eye. H25.11   POSTOPERATIVE DIAGNOSIS:  Nuclear sclerotic cataract right eye.     PROCEDURE:  Phacoemusification with posterior chamber intraocular lens placement of the right eye   ULTRASOUND TIME: Procedure(s): CATARACT EXTRACTION PHACO AND INTRAOCULAR LENS PLACEMENT (IOC) RIGHT 5.03 00:50.6 9.9% (Right)  LENS:   Implant Name Type Inv. Item Serial No. Manufacturer Lot No. LRB No. Used Action  LENS IOL TECNIS EYHANCE 18.0 - [id number] Intraocular Lens LENS IOL TECNIS EYHANCE 18.0 [id number] JOHNSON   Right 1 Implanted         SURGEON:  Wyonia Hough, MD   ANESTHESIA:  Topical with tetracaine drops and 2% Xylocaine jelly, augmented with 1% preservative-free intracameral lidocaine.    COMPLICATIONS:  None.   DESCRIPTION OF PROCEDURE:  The patient was identified in the holding room and transported to the operating room and placed in the supine position under the operating microscope.  The right eye was identified as the operative eye and it was prepped and draped in the usual sterile ophthalmic fashion.   A 1 millimeter clear-corneal paracentesis was made at the 12:00 position.  0.5 ml of preservative-free 1% lidocaine was injected into the anterior chamber. The anterior chamber was filled with Viscoat viscoelastic.  A 2.4 millimeter keratome was used to make a near-clear corneal incision at the 9:00 position.  A curvilinear capsulorrhexis was made with a cystotome and capsulorrhexis forceps.  Balanced salt solution was used to hydrodissect and hydrodelineate the nucleus.   Phacoemulsification was then used in stop and chop fashion to remove the lens nucleus and epinucleus.  The remaining cortex was then removed using the irrigation and aspiration handpiece. Provisc was then placed into the capsular bag to distend it for lens placement.  A lens was then injected into the capsular bag.   The remaining viscoelastic was aspirated.   Wounds were hydrated with balanced salt solution.  The anterior chamber was inflated to a physiologic pressure with balanced salt solution.  No wound leaks were noted. Cefuroxime 0.1 ml of a 10mg /ml solution was injected into the anterior chamber for a dose of 1 mg of intracameral antibiotic at the completion of the case.   Timolol and Brimonidine drops were applied to the eye.  The patient was taken to the recovery room in stable condition without complications of anesthesia or surgery.   Liannah Yarbough 03/27/2020, 11:43 AM

## 2020-03-27 NOTE — Anesthesia Procedure Notes (Signed)
Procedure Name: MAC Date/Time: 03/27/2020 11:29 AM Performed by: Cameron Ali, CRNA Pre-anesthesia Checklist: Patient identified, Emergency Drugs available, Suction available, Timeout performed and Patient being monitored Patient Re-evaluated:Patient Re-evaluated prior to induction Oxygen Delivery Method: Nasal cannula Placement Confirmation: positive ETCO2

## 2020-03-27 NOTE — Anesthesia Preprocedure Evaluation (Signed)
Anesthesia Evaluation  Patient identified by MRN, date of birth, ID band Patient awake    Reviewed: Allergy & Precautions, H&P , NPO status , Patient's Chart, lab work & pertinent test results  Airway Mallampati: II  TM Distance: >3 FB Neck ROM: full    Dental no notable dental hx.    Pulmonary former smoker,    Pulmonary exam normal breath sounds clear to auscultation       Cardiovascular hypertension, Normal cardiovascular exam Rhythm:regular Rate:Normal     Neuro/Psych    GI/Hepatic GERD  ,  Endo/Other    Renal/GU      Musculoskeletal   Abdominal   Peds  Hematology   Anesthesia Other Findings   Reproductive/Obstetrics                             Anesthesia Physical Anesthesia Plan  ASA: II  Anesthesia Plan: MAC   Post-op Pain Management:    Induction:   PONV Risk Score and Plan: 2 and Treatment may vary due to age or medical condition, TIVA and Midazolam  Airway Management Planned:   Additional Equipment:   Intra-op Plan:   Post-operative Plan:   Informed Consent: I have reviewed the patients History and Physical, chart, labs and discussed the procedure including the risks, benefits and alternatives for the proposed anesthesia with the patient or authorized representative who has indicated his/her understanding and acceptance.     Dental Advisory Given  Plan Discussed with: CRNA  Anesthesia Plan Comments:         Anesthesia Quick Evaluation

## 2020-03-27 NOTE — H&P (Signed)

## 2020-03-27 NOTE — Anesthesia Postprocedure Evaluation (Signed)
Anesthesia Post Note  Patient: Holly Forbes  Procedure(s) Performed: CATARACT EXTRACTION PHACO AND INTRAOCULAR LENS PLACEMENT (IOC) RIGHT 5.03 00:50.6 9.9% (Right Eye)     Patient location during evaluation: PACU Anesthesia Type: MAC Level of consciousness: awake and alert and oriented Pain management: satisfactory to patient Vital Signs Assessment: post-procedure vital signs reviewed and stable Respiratory status: spontaneous breathing, nonlabored ventilation and respiratory function stable Cardiovascular status: blood pressure returned to baseline and stable Postop Assessment: Adequate PO intake and No signs of nausea or vomiting Anesthetic complications: no   No complications documented.  Raliegh Ip

## 2020-03-27 NOTE — Transfer of Care (Signed)
Immediate Anesthesia Transfer of Care Note  Patient: Holly Forbes  Procedure(s) Performed: CATARACT EXTRACTION PHACO AND INTRAOCULAR LENS PLACEMENT (IOC) RIGHT 5.03 00:50.6 9.9% (Right Eye)  Patient Location: PACU  Anesthesia Type: MAC  Level of Consciousness: awake, alert  and patient cooperative  Airway and Oxygen Therapy: Patient Spontanous Breathing and Patient connected to supplemental oxygen  Post-op Assessment: Post-op Vital signs reviewed, Patient's Cardiovascular Status Stable, Respiratory Function Stable, Patent Airway and No signs of Nausea or vomiting  Post-op Vital Signs: Reviewed and stable  Complications: No complications documented.

## 2020-03-28 ENCOUNTER — Encounter: Payer: Self-pay | Admitting: Ophthalmology

## 2020-04-10 ENCOUNTER — Other Ambulatory Visit: Payer: Self-pay

## 2020-04-10 ENCOUNTER — Encounter: Payer: Self-pay | Admitting: Ophthalmology

## 2020-04-11 DIAGNOSIS — E7800 Pure hypercholesterolemia, unspecified: Secondary | ICD-10-CM | POA: Diagnosis not present

## 2020-04-11 DIAGNOSIS — H2512 Age-related nuclear cataract, left eye: Secondary | ICD-10-CM | POA: Diagnosis not present

## 2020-04-16 ENCOUNTER — Other Ambulatory Visit: Payer: Self-pay

## 2020-04-16 ENCOUNTER — Ambulatory Visit: Payer: PPO | Admitting: Internal Medicine

## 2020-04-16 ENCOUNTER — Other Ambulatory Visit
Admission: RE | Admit: 2020-04-16 | Discharge: 2020-04-16 | Disposition: A | Discharge status: Home / Self Care | Payer: PPO | Source: Ambulatory Visit | Attending: Ophthalmology | Admitting: Ophthalmology

## 2020-04-16 DIAGNOSIS — Z01812 Encounter for preprocedural laboratory examination: Secondary | ICD-10-CM | POA: Diagnosis not present

## 2020-04-16 DIAGNOSIS — Z20822 Contact with and (suspected) exposure to covid-19: Secondary | ICD-10-CM | POA: Diagnosis not present

## 2020-04-16 LAB — SARS CORONAVIRUS 2 (TAT 6-24 HRS): SARS Coronavirus 2: NEGATIVE

## 2020-04-17 MED ORDER — ARMC OPHTHALMIC DILATING DROPS
1.0000 "application " | OPHTHALMIC | Status: DC | PRN
  Administered 2020-04-18 (×3): 1 via OPHTHALMIC

## 2020-04-17 MED ORDER — LACTATED RINGERS IV SOLN: INTRAVENOUS | Status: DC

## 2020-04-17 MED ORDER — TETRACAINE HCL 0.5 % OP SOLN
1.0000 [drp] | OPHTHALMIC | Status: DC | PRN
  Administered 2020-04-18 (×3): 1 [drp] via OPHTHALMIC

## 2020-04-18 ENCOUNTER — Ambulatory Visit: Admission: RE | Admit: 2020-04-18 | Payer: PPO | Source: Home / Self Care | Admitting: Ophthalmology

## 2020-04-18 ENCOUNTER — Ambulatory Visit: Payer: PPO | Admitting: Anesthesiology

## 2020-04-18 ENCOUNTER — Encounter: Payer: Self-pay | Admitting: Ophthalmology

## 2020-04-18 ENCOUNTER — Other Ambulatory Visit: Payer: Self-pay

## 2020-04-18 ENCOUNTER — Encounter
Admission: RE | Disposition: A | Discharge status: Home / Self Care | Payer: Self-pay | Source: Home / Self Care | Attending: Ophthalmology

## 2020-04-18 ENCOUNTER — Ambulatory Visit
Admission: RE | Admit: 2020-04-18 | Discharge: 2020-04-18 | Disposition: A | Discharge status: Home / Self Care | Payer: PPO | Attending: Ophthalmology | Admitting: Ophthalmology

## 2020-04-18 DIAGNOSIS — Z79899 Other long term (current) drug therapy: Secondary | ICD-10-CM | POA: Insufficient documentation

## 2020-04-18 DIAGNOSIS — H2512 Age-related nuclear cataract, left eye: Secondary | ICD-10-CM | POA: Insufficient documentation

## 2020-04-18 DIAGNOSIS — Z87891 Personal history of nicotine dependence: Secondary | ICD-10-CM | POA: Diagnosis not present

## 2020-04-18 DIAGNOSIS — Z7982 Long term (current) use of aspirin: Secondary | ICD-10-CM | POA: Diagnosis not present

## 2020-04-18 HISTORY — PX: CATARACT EXTRACTION W/PHACO: SHX586

## 2020-04-18 SURGERY — PHACOEMULSIFICATION, CATARACT, WITH IOL INSERTION
Anesthesia: General | Site: Eye | Laterality: Left

## 2020-04-18 MED ORDER — MIDAZOLAM HCL 2 MG/2ML IJ SOLN
INTRAMUSCULAR | Status: AC
  Filled 2020-04-18: qty 2

## 2020-04-18 MED ORDER — LIDOCAINE HCL (PF) 2 % IJ SOLN
INTRAOCULAR | Status: DC | PRN
  Administered 2020-04-18: 2 mL

## 2020-04-18 MED ORDER — EPINEPHRINE PF 1 MG/ML IJ SOLN
INTRAOCULAR | Status: DC | PRN
  Administered 2020-04-18: 74 mL via OPHTHALMIC

## 2020-04-18 MED ORDER — SODIUM CHLORIDE 0.9 % IV SOLN: INTRAVENOUS | Status: DC | PRN

## 2020-04-18 MED ORDER — CEFUROXIME OPHTHALMIC INJECTION 1 MG/0.1 ML
INJECTION | OPHTHALMIC | Status: DC | PRN
  Administered 2020-04-18: 0.1 mL via INTRACAMERAL

## 2020-04-18 MED ORDER — MIDAZOLAM HCL 2 MG/2ML IJ SOLN
INTRAMUSCULAR | Status: DC | PRN
  Administered 2020-04-18: .5 mg via INTRAVENOUS
  Administered 2020-04-18: 1 mg via INTRAVENOUS

## 2020-04-18 MED ORDER — BRIMONIDINE TARTRATE-TIMOLOL 0.2-0.5 % OP SOLN
OPHTHALMIC | Status: DC | PRN
  Administered 2020-04-18: 1 [drp] via OPHTHALMIC

## 2020-04-18 MED ORDER — FENTANYL CITRATE (PF) 100 MCG/2ML IJ SOLN
INTRAMUSCULAR | Status: DC | PRN
  Administered 2020-04-18 (×3): 25 ug via INTRAVENOUS

## 2020-04-18 MED ORDER — FENTANYL CITRATE (PF) 100 MCG/2ML IJ SOLN
INTRAMUSCULAR | Status: AC
  Filled 2020-04-18: qty 2

## 2020-04-18 MED ORDER — NA HYALUR & NA CHOND-NA HYALUR 0.4-0.35 ML IO KIT
PACK | INTRAOCULAR | Status: DC | PRN
  Administered 2020-04-18: 1 mL via INTRAOCULAR

## 2020-04-18 SURGICAL SUPPLY — 22 items
CANNULA ANT/CHMB 27G (MISCELLANEOUS) IMPLANT
CANNULA ANT/CHMB 27GA (MISCELLANEOUS) ×2 IMPLANT
GLOVE BIO SURGEON STRL SZ8 (GLOVE) ×2 IMPLANT
GLOVE BIOGEL M 6.5 STRL (GLOVE) ×2 IMPLANT
GLOVE SURG LX 7.5 STRW (GLOVE) ×1
GLOVE SURG LX STRL 7.5 STRW (GLOVE) ×1 IMPLANT
GOWN STRL REUS W/ TWL LRG LVL3 (GOWN DISPOSABLE) ×2 IMPLANT
GOWN STRL REUS W/TWL LRG LVL3 (GOWN DISPOSABLE) ×4
LENS IOL TECNIS EYHANCE 19.0 (Intraocular Lens) ×1 IMPLANT
MARKER SKIN DUAL TIP RULER LAB (MISCELLANEOUS) ×1 IMPLANT
NDL CAPSULORHEX 25GA (NEEDLE) IMPLANT
NDL FILTER BLUNT 18X1 1/2 (NEEDLE) IMPLANT
NEEDLE CAPSULORHEX 25GA (NEEDLE) ×2 IMPLANT
NEEDLE FILTER BLUNT 18X 1/2SAF (NEEDLE) ×2
NEEDLE FILTER BLUNT 18X1 1/2 (NEEDLE) ×2 IMPLANT
PACK CATARACT (MISCELLANEOUS) ×2 IMPLANT
PACK CATARACT BRASINGTON LX (MISCELLANEOUS) ×2 IMPLANT
PACK EYE AFTER SURG (MISCELLANEOUS) ×2 IMPLANT
SYR 3ML LL SCALE MARK (SYRINGE) ×2 IMPLANT
SYR TB 1ML LUER SLIP (SYRINGE) ×1 IMPLANT
WATER STERILE IRR 250ML POUR (IV SOLUTION) ×2 IMPLANT
WIPE NON LINTING 3.25X3.25 (MISCELLANEOUS) ×2 IMPLANT

## 2020-04-18 NOTE — H&P (Signed)

## 2020-04-18 NOTE — Anesthesia Preprocedure Evaluation (Addendum)
Anesthesia Evaluation  Patient identified by MRN, date of birth, ID band Patient awake    Reviewed: Allergy & Precautions, H&P , NPO status , Patient's Chart, lab work & pertinent test results  Airway Mallampati: II  TM Distance: >3 FB Neck ROM: full    Dental no notable dental hx.    Pulmonary former smoker,    Pulmonary exam normal breath sounds clear to auscultation       Cardiovascular hypertension, Normal cardiovascular exam Rhythm:regular Rate:Normal     Neuro/Psych negative neurological ROS  negative psych ROS   GI/Hepatic Neg liver ROS, GERD  ,  Endo/Other  diabetes  Renal/GU negative Renal ROS  negative genitourinary   Musculoskeletal  (+) Arthritis , Osteoarthritis,    Abdominal   Peds negative pediatric ROS (+)  Hematology negative hematology ROS (+)   Anesthesia Other Findings   Reproductive/Obstetrics                             Anesthesia Physical  Anesthesia Plan  ASA: II  Anesthesia Plan: General and MAC   Post-op Pain Management:    Induction: Intravenous  PONV Risk Score and Plan: 2 and Treatment may vary due to age or medical condition, TIVA and Midazolam  Airway Management Planned: Nasal Cannula  Additional Equipment:   Intra-op Plan:   Post-operative Plan:   Informed Consent: I have reviewed the patients History and Physical, chart, labs and discussed the procedure including the risks, benefits and alternatives for the proposed anesthesia with the patient or authorized representative who has indicated his/her understanding and acceptance.     Dental Advisory Given  Plan Discussed with: CRNA  Anesthesia Plan Comments:        Anesthesia Quick Evaluation

## 2020-04-18 NOTE — Op Note (Signed)
OPERATIVE NOTE  Holly Forbes [id number] 04/18/2020   PREOPERATIVE DIAGNOSIS:  Nuclear sclerotic cataract left eye. H25.12   POSTOPERATIVE DIAGNOSIS:    Nuclear sclerotic cataract left eye.     PROCEDURE:  Phacoemusification with posterior chamber intraocular lens placement of the left eye  Ultrasound time: Procedure(s): CATARACT EXTRACTION PHACO AND INTRAOCULAR LENS PLACEMENT (IOC) LEFT DIABETIC 4.56 00:51.7 8.8% (Left)  LENS:   Implant Name Type Inv. Item Serial No. Manufacturer Lot No. LRB No. Used Action  LENS IOL TECNIS EYHANCE 19.0 - [id number] Intraocular Lens LENS IOL TECNIS EYHANCE 19.0 [id number] JOHNSON   Left 1 Implanted      SURGEON:  Wyonia Hough, MD   ANESTHESIA:  Topical with tetracaine drops and 2% Xylocaine jelly, augmented with 1% preservative-free intracameral lidocaine.    COMPLICATIONS:  None.   DESCRIPTION OF PROCEDURE:  The patient was identified in the holding room and transported to the operating room and placed in the supine position under the operating microscope.  The left eye was identified as the operative eye and it was prepped and draped in the usual sterile ophthalmic fashion.   A 1 millimeter clear-corneal paracentesis was made at the 1:30 position.  0.5 ml of preservative-free 1% lidocaine was injected into the anterior chamber.  The anterior chamber was filled with Viscoat viscoelastic.  A 2.4 millimeter keratome was used to make a near-clear corneal incision at the 10:30 position.  .  A curvilinear capsulorrhexis was made with a cystotome and capsulorrhexis forceps.  Balanced salt solution was used to hydrodissect and hydrodelineate the nucleus.   Phacoemulsification was then used in stop and chop fashion to remove the lens nucleus and epinucleus.  The remaining cortex was then removed using the irrigation and aspiration handpiece. Provisc was then placed into the capsular bag to distend it for lens placement.  A lens was then  injected into the capsular bag.  The remaining viscoelastic was aspirated.   Wounds were hydrated with balanced salt solution.  The anterior chamber was inflated to a physiologic pressure with balanced salt solution.  No wound leaks were noted. Cefuroxime 0.1 ml of a 10mg /ml solution was injected into the anterior chamber for a dose of 1 mg of intracameral antibiotic at the completion of the case.   Timolol and Brimonidine drops were applied to the eye.  The patient was taken to the recovery room in stable condition without complications of anesthesia or surgery.  [name] 04/18/2020, 12:10 PM

## 2020-04-18 NOTE — Transfer of Care (Signed)
Immediate Anesthesia Transfer of Care Note  Patient: Holly Forbes  Procedure(s) Performed: CATARACT EXTRACTION PHACO AND INTRAOCULAR LENS PLACEMENT (IOC) LEFT DIABETIC 4.56 00:51.7 8.8% (Left Eye)  Patient Location: PACU  Anesthesia Type:MAC  Level of Consciousness: awake, alert  and oriented  Airway & Oxygen Therapy: Patient Spontanous Breathing  Post-op Assessment: Report given to RN and Post -op Vital signs reviewed and stable  Post vital signs: stable  Last Vitals:  Vitals Value Taken Time  BP    Temp    Pulse    Resp    SpO2      Last Pain:  Vitals:   04/18/20 0945  TempSrc: Temporal  PainSc: 0-No pain         Complications: No complications documented.

## 2020-04-18 NOTE — Anesthesia Procedure Notes (Signed)
Procedure Name: MAC Date/Time: 04/18/2020 11:55 AM Performed by: Natasha Mead, CRNA Pre-anesthesia Checklist: Patient identified, Emergency Drugs available, Suction available and Patient being monitored Oxygen Delivery Method: Nasal cannula

## 2020-04-21 NOTE — Anesthesia Postprocedure Evaluation (Signed)
Anesthesia Post Note  Patient: Holly Forbes  Procedure(s) Performed: CATARACT EXTRACTION PHACO AND INTRAOCULAR LENS PLACEMENT (IOC) LEFT DIABETIC 4.56 00:51.7 8.8% (Left Eye)  Patient location during evaluation: PACU Anesthesia Type: General Level of consciousness: awake and alert and oriented Pain management: pain level controlled Vital Signs Assessment: post-procedure vital signs reviewed and stable Respiratory status: spontaneous breathing Cardiovascular status: blood pressure returned to baseline Anesthetic complications: no   No complications documented.   Last Vitals:  Vitals:   04/18/20 0945 04/18/20 1212  BP: (!) 178/79 (!) 167/88  Pulse: 63 71  Resp: 16 14  Temp: 36.6 C 36.4 C  SpO2: 98% 100%    Last Pain:  Vitals:   04/19/20 0927  TempSrc:   PainSc: 0-No pain                 Rustyn Conery

## 2020-04-24 ENCOUNTER — Encounter: Payer: Self-pay | Admitting: Internal Medicine

## 2020-04-24 ENCOUNTER — Ambulatory Visit: Payer: PPO | Admitting: Internal Medicine

## 2020-04-24 ENCOUNTER — Other Ambulatory Visit: Payer: Self-pay

## 2020-04-24 DIAGNOSIS — I10 Essential (primary) hypertension: Secondary | ICD-10-CM

## 2020-04-24 DIAGNOSIS — I5189 Other ill-defined heart diseases: Secondary | ICD-10-CM | POA: Diagnosis not present

## 2020-04-24 DIAGNOSIS — G4733 Obstructive sleep apnea (adult) (pediatric): Secondary | ICD-10-CM | POA: Diagnosis not present

## 2020-04-24 NOTE — Progress Notes (Signed)
[facility] [place],  [address]  Internal MEDICINE  Office Visit Note  Patient Name: Holly Forbes  [id number]  [id number]  Date of Service: 04/26/2020  Chief Complaint  Patient presents with   New Patient (Initial Visit)    follow up for sleep study   HPI  Pt is seen for Pulmonary and sleep. She has baseline sleep study and is here to discuss her results  This apnea study is consistent with mild obstructive sleep apnea.  Patient did have significant oxygen desaturation with lowest saturation of 82%.  There is also some tachycardia noted clinical correlation is recommended Auto PAP with pressure ranges 5-20 cmH20 with download, or facility based PAP Titration Study Her blood pressure continues to be elevated  Current Medication: Outpatient Encounter Medications as of 04/24/2020  Medication Sig   APPLE CIDER VINEGAR PO Take 2 capsules by mouth daily.   aspirin 81 MG tablet Take 81 mg by mouth daily.    Cholecalciferol (DIALYVITE VITAMIN D 5000) 125 MCG (5000 UT) capsule Take 5,000 Units by mouth daily.   dorzolamide-timolol (COSOPT) 22.3-6.8 MG/ML ophthalmic solution Place 1 drop into both eyes 2 (two) times daily.    hydrochlorothiazide (HYDRODIURIL) 12.5 MG tablet Take 2 tablets (25 mg total) by mouth daily.   ibuprofen (ADVIL) 400 MG tablet Take 1 tablet (400 mg total) by mouth 2 (two) times daily as needed. (Patient taking differently: Take 400 mg by mouth 2 (two) times daily as needed for moderate pain. )   latanoprost (XALATAN) 0.005 % ophthalmic solution Place 1 drop into both eyes at bedtime.    lisinopril (ZESTRIL) 20 MG tablet Take 2 tablets (40 mg total) by mouth daily. (Patient taking differently: Take 20 mg by mouth 2 (two) times daily. )   pantoprazole (PROTONIX) 20 MG tablet Take 1 tablet (20 mg total) by mouth daily.   PREDNISOLON-MOXIFLOX-BROMFENAC OP Place 1 drop into the right eye 2 (two) times daily.    rosuvastatin (CRESTOR) 5 MG tablet Take 1 tablet (5 mg total) by mouth daily.   sodium chloride (OCEAN) 0.65 % SOLN nasal spray Place 1 spray into both nostrils as needed for congestion.   vitamin B-12 (CYANOCOBALAMIN) 500 MCG tablet Take 500 mcg by mouth daily.   amLODipine (NORVASC) 2.5 MG tablet Take one tab po qhs for blood pressure   furosemide (LASIX) 20 MG tablet Take 1 tablet (20 mg total) by mouth daily.   Facility-Administered Encounter Medications as of 04/24/2020  Medication   triamcinolone acetonide (KENALOG) 10 MG/ML injection 10 mg    Surgical History: Past Surgical History:  Procedure Laterality Date   ABDOMINAL HYSTERECTOMY     BREAST EXCISIONAL BIOPSY Left 2015   cyst removed   CATARACT EXTRACTION W/PHACO Right 03/27/2020   Procedure: CATARACT EXTRACTION PHACO AND INTRAOCULAR LENS PLACEMENT (IOC) RIGHT 5.03 00:50.6 9.9%;  Surgeon: Leandrew Koyanagi, MD;  Location: [place];  Service: Ophthalmology;  Laterality: Right;   CATARACT EXTRACTION W/PHACO Left 04/18/2020   Procedure: CATARACT EXTRACTION PHACO AND INTRAOCULAR LENS PLACEMENT (IOC) LEFT DIABETIC 4.56 00:51.7 8.8%;  Surgeon: Leandrew Koyanagi, MD;  Location: [facility];  Service: Ophthalmology;  Laterality: Left;   CHOLECYSTECTOMY     COLONOSCOPY N/A 09/25/2014   Procedure: COLONOSCOPY;  Surgeon: Lucilla Lame, MD;  Location: [place];  Service: Gastroenterology;  Laterality: N/A;  cecum time- 0852   CYST REMOVAL NECK     ESOPHAGOGASTRODUODENOSCOPY N/A 09/25/2014   Procedure: ESOPHAGOGASTRODUODENOSCOPY (EGD);  Surgeon: Lucilla Lame,  MD;  Location: [place];  Service: Gastroenterology;  Laterality: N/A;   ESOPHAGOGASTRODUODENOSCOPY (EGD) WITH PROPOFOL N/A 04/07/2018   Procedure: ESOPHAGOGASTRODUODENOSCOPY (EGD) WITH PROPOFOL;  Surgeon: Toledo, Benay Pike, MD;  Location: [facility];  Service: Gastroenterology;  Laterality: N/A;   POLYPECTOMY  09/25/2014   Procedure:  POLYPECTOMY INTESTINAL;  Surgeon: Lucilla Lame, MD;  Location: [place];  Service: Gastroenterology;;    Medical History: 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   Back pain    Left sciatica, low back pain   Cataract    GERD (gastroesophageal reflux disease)    Glaucoma    Hypertension    Vitamin D deficiency     Family History: Family History  Problem Relation Age of Onset   Heart disease Sister    Diabetes Sister    Hypertension Sister    Heart disease Brother    Diabetes Brother    Hypertension Brother    Breast cancer Daughter 24   Colon cancer Mother    Stroke Mother    Lung cancer Father     Social History   Socioeconomic History   Marital status: Married    Spouse name: Not on file   Number of children: Not on file   Years of education: Not on file   Highest education level: Not on file  Occupational History   Not on file  Tobacco Use   Smoking status: Former Smoker    Packs/day: 0.25    Years: 15.00    Pack years: 3.75    Types: Cigarettes    Quit date: 1995    Years since quitting: 26.9   Smokeless tobacco: Never Used  Scientific laboratory technician Use: Never used  Substance and Sexual Activity   Alcohol use: Yes    Comment: 3-5 glasses wine/month   Drug use: Not Currently   Sexual activity: Not on file  Other Topics Concern   Not on file  Social History Narrative   Not on file   Social Determinants of Health   Financial Resource Strain: Not on file  Food Insecurity: Not on file  Transportation Needs: Not on file  Physical Activity: Not on file  Stress: Not on file  Social Connections: Not on file  Intimate Partner Violence: Not on file      Review of Systems  Constitutional: Negative for chills, diaphoresis and fatigue.  HENT: Negative for ear pain, postnasal drip and sinus pressure.   Eyes: Negative for photophobia, discharge, redness,  itching and visual disturbance.  Respiratory: Negative for cough, shortness of breath and wheezing.   Cardiovascular: Negative for chest pain, palpitations and leg swelling.  Gastrointestinal: Negative for abdominal pain, constipation, diarrhea, nausea and vomiting.  Genitourinary: Negative for dysuria and flank pain.  Musculoskeletal: Negative for arthralgias, back pain, gait problem and neck pain.  Skin: Negative for color change.  Allergic/Immunologic: Negative for environmental allergies and food allergies.  Neurological: Negative for dizziness and headaches.  Hematological: Does not bruise/bleed easily.  Psychiatric/Behavioral: Negative for agitation, behavioral problems (depression) and hallucinations.    Vital Signs: BP (!) 158/82    Pulse 75    Temp 97.9 F (36.6 C)    Resp 16    Ht 5\' 7"  (1.702 m)    Wt 241 lb (109.3 kg)    LMP  (LMP Unknown)    SpO2 99%    BMI 37.75 kg/m  Physical Exam Constitutional:      General: She is not in acute distress.    Appearance: She is well-developed and well-nourished. She is not diaphoretic.  HENT:     Head: Normocephalic and atraumatic.     Mouth/Throat:     Mouth: Oropharynx is clear and moist.     Pharynx: No oropharyngeal exudate.  Eyes:     Extraocular Movements: EOM normal.     Pupils: Pupils are equal, round, and reactive to light.  Neck:     Thyroid: No thyromegaly.     Vascular: No JVD.     Trachea: No tracheal deviation.  Cardiovascular:     Rate and Rhythm: Normal rate and regular rhythm.     Heart sounds: Normal heart sounds. No murmur heard. No friction rub. No gallop.   Pulmonary:     Effort: Pulmonary effort is normal. No respiratory distress.     Breath sounds: No wheezing or rales.  Chest:     Chest wall: No tenderness.  Abdominal:     General: Bowel sounds are normal.     Palpations: Abdomen is soft.  Musculoskeletal:        General: Normal range of motion.     Cervical back: Normal range of motion and  neck supple.     Right lower leg: Edema present.     Left lower leg: Edema present.  Lymphadenopathy:     Cervical: No cervical adenopathy.  Skin:    General: Skin is warm and dry.  Neurological:     Mental Status: She is alert and oriented to person, place, and time.     Cranial Nerves: No cranial nerve deficit.  Psychiatric:        Mood and Affect: Mood and affect normal.        Behavior: Behavior normal.        Thought Content: Thought content normal.        Judgment: Judgment normal.     Assessment/Plan: 1. OSA (obstructive sleep apnea) Pt has mild OSA with O2 desaturation of 82%, will need auto pap or CPAP titration in lab along with overnight pulse oximetry  2. Diastolic dysfunction Start Lasix ( BLE) 20 mg po qd. Stop hctz   3. Essential hypertension Add Norvasc 2.5 mg po qd, contine all other rmeds   General Counseling: Derin verbalizes understanding of the findings of todays visit and agrees with plan of treatment. I have discussed any further diagnostic evaluation that may be needed or ordered today. We also reviewed her medications today. she has been encouraged to call the office with any questions or concerns that should arise related to todays visit.    Orders Placed This Encounter  Procedures   For home use only DME continuous positive airway pressure (CPAP)    Meds ordered this encounter  Medications   furosemide (LASIX) 20 MG tablet    Sig: Take 1 tablet (20 mg total) by mouth daily.    Dispense:  30 tablet    Refill:  3   amLODipine (NORVASC) 2.5 MG tablet    Sig: Take one tab po qhs for blood pressure    Dispense:  90 tablet    Refill:  3    Total time spent:30 Minutes Time spent includes review of chart, medications, test results, and follow up plan with the patient.    Dr Lavera Guise Internal medicine

## 2020-04-26 ENCOUNTER — Ambulatory Visit (INDEPENDENT_AMBULATORY_CARE_PROVIDER_SITE_OTHER): Payer: PPO | Admitting: Internal Medicine

## 2020-04-26 ENCOUNTER — Encounter: Payer: Self-pay | Admitting: Internal Medicine

## 2020-04-26 ENCOUNTER — Telehealth: Payer: Self-pay

## 2020-04-26 ENCOUNTER — Other Ambulatory Visit: Payer: Self-pay

## 2020-04-26 VITALS — BP 162/92 | HR 77 | Temp 97.2°F | Resp 16 | Ht 67.0 in | Wt 236.2 lb

## 2020-04-26 DIAGNOSIS — E785 Hyperlipidemia, unspecified: Secondary | ICD-10-CM | POA: Diagnosis not present

## 2020-04-26 DIAGNOSIS — G4733 Obstructive sleep apnea (adult) (pediatric): Secondary | ICD-10-CM

## 2020-04-26 DIAGNOSIS — I10 Essential (primary) hypertension: Secondary | ICD-10-CM

## 2020-04-26 DIAGNOSIS — Z6836 Body mass index (BMI) 36.0-36.9, adult: Secondary | ICD-10-CM | POA: Diagnosis not present

## 2020-04-26 DIAGNOSIS — E119 Type 2 diabetes mellitus without complications: Secondary | ICD-10-CM

## 2020-04-26 DIAGNOSIS — E1165 Type 2 diabetes mellitus with hyperglycemia: Secondary | ICD-10-CM

## 2020-04-26 DIAGNOSIS — E1169 Type 2 diabetes mellitus with other specified complication: Secondary | ICD-10-CM | POA: Diagnosis not present

## 2020-04-26 LAB — POCT UA - MICROALBUMIN
Albumin/Creatinine Ratio, Urine, POC: 10
Creatinine, POC: 10 mg/dL
Microalbumin Ur, POC: <30 mg/L

## 2020-04-26 MED ORDER — ACCU-CHEK SOFTCLIX LANCETS MISC: 12 refills | Status: DC

## 2020-04-26 MED ORDER — AMLODIPINE BESYLATE 2.5 MG PO TABS: ORAL_TABLET | ORAL | 3 refills | Status: DC

## 2020-04-26 MED ORDER — ACCU-CHEK GUIDE VI STRP: ORAL_STRIP | 12 refills | Status: DC

## 2020-04-26 MED ORDER — FUROSEMIDE 20 MG PO TABS: 20.0000 mg | ORAL_TABLET | Freq: Every day | ORAL | 3 refills | Status: DC

## 2020-04-26 NOTE — Patient Instructions (Addendum)
Diabetes Mellitus and Nutrition, Adult When you have diabetes (diabetes mellitus), it is very important to have healthy eating habits because your blood sugar (glucose) levels are greatly affected by what you eat and drink. Eating healthy foods in the appropriate amounts, at about the same times every day, can help you:  Control your blood glucose.  Lower your risk of heart disease.  Improve your blood pressure.  Reach or maintain a healthy weight. Every person with diabetes is different, and each person has different needs for a meal plan. Your health care provider may recommend that you work with a diet and nutrition specialist (dietitian) to make a meal plan that is best for you. Your meal plan may vary depending on factors such as:  The calories you need.  The medicines you take.  Your weight.  Your blood glucose, blood pressure, and cholesterol levels.  Your activity level.  Other health conditions you have, such as heart or kidney disease. How do carbohydrates affect me? Carbohydrates, also called carbs, affect your blood glucose level more than any other type of food. Eating carbs naturally raises the amount of glucose in your blood. Carb counting is a method for keeping track of how many carbs you eat. Counting carbs is important to keep your blood glucose at a healthy level, especially if you use insulin or take certain oral diabetes medicines. It is important to know how many carbs you can safely have in each meal. This is different for every person. Your dietitian can help you calculate how many carbs you should have at each meal and for each snack. Foods that contain carbs include:  Bread, cereal, rice, pasta, and crackers.  Potatoes and corn.  Peas, beans, and lentils.  Milk and yogurt. Fruit and juice.  Desserts, such as cakes, cookies, ice cream, and candy. How does alcohol affect me? Alcohol can cause a sudden decrease in blood glucose (hypoglycemia), especially  if you use insulin or take certain oral diabetes medicines. Hypoglycemia can be a life-threatening condition. Symptoms of hypoglycemia (sleepiness, dizziness, and confusion) are similar to symptoms of having too much alcohol. If your health care provider says that alcohol is safe for you, follow these guidelines:  Limit alcohol intake to no more than 1 drink per day for nonpregnant women and 2 drinks per day for men. One drink equals 12 oz of beer, 5 oz of wine, or 1 oz of hard liquor.  Do not drink on an empty stomach.  Keep yourself hydrated with water, diet soda, or unsweetened iced tea.  Keep in mind that regular soda, juice, and other mixers may contain a lot of sugar and must be counted as carbs. What are tips for following this plan?  Reading food labels  Start by checking the serving size on the "Nutrition Facts" label of packaged foods and drinks. The amount of calories, carbs, fats, and other nutrients listed on the label is based on one serving of the item. Many items contain more than one serving per package.  Check the total grams (g) of carbs in one serving. You can calculate the number of servings of carbs in one serving by dividing the total carbs by 15. For example, if a food has 30 g of total carbs, it would be equal to 2 servings of carbs.  Check the number of grams (g) of saturated and trans fats in one serving. Choose foods that have low or no amount of these fats.  Check the number of milligrams (  mg) of salt (sodium) in one serving. Most people should limit total sodium intake to less than 2,300 mg per day.  Always check the nutrition information of foods labeled as "low-fat" or "nonfat". These foods may be higher in added sugar or refined carbs and should be avoided.  Talk to your dietitian to identify your daily goals for nutrients listed on the label. Shopping  Avoid buying canned, premade, or processed foods. These foods tend to be high in fat, sodium, and added  sugar.  Shop around the outside edge of the grocery store. This includes fresh fruits and vegetables, bulk grains, fresh meats, and fresh dairy. Cooking  Use low-heat cooking methods, such as baking, instead of high-heat cooking methods like deep frying.  Cook using healthy oils, such as olive, canola, or sunflower oil.  Avoid cooking with butter, cream, or high-fat meats. Meal planning  Eat meals and snacks regularly, preferably at the same times every day. Avoid going long periods of time without eating.  Eat foods high in fiber, such as fresh fruits, vegetables, beans, and whole grains. Talk to your dietitian about how many servings of carbs you can eat at each meal.  Eat 4-6 ounces (oz) of lean protein each day, such as lean meat, chicken, fish, eggs, or tofu. One oz of lean protein is equal to: ? 1 oz of meat, chicken, or fish. ? 1 egg. ?  cup of tofu.  Eat some foods each day that contain healthy fats, such as avocado, nuts, seeds, and fish. Lifestyle  Check your blood glucose regularly.  Exercise regularly as told by your health care provider. This may include: ? 150 minutes of moderate-intensity or vigorous-intensity exercise each week. This could be brisk walking, biking, or water aerobics. ? Stretching and doing strength exercises, such as yoga or weightlifting, at least 2 times a week.  Take medicines as told by your health care provider.  Do not use any products that contain nicotine or tobacco, such as cigarettes and e-cigarettes. If you need help quitting, ask your health care provider.  Work with a counselor or diabetes educator to identify strategies to manage stress and any emotional and social challenges. Questions to ask a health care provider  Do I need to meet with a diabetes educator?  Do I need to meet with a dietitian?  What number can I call if I have questions?  When are the best times to check my blood glucose? Where to find more  information:  American Diabetes Association: diabetes.org  Academy of Nutrition and Dietetics: www.eatright.org  National Institute of Diabetes and Digestive and Kidney Diseases (NIH): www.niddk.nih.gov Summary  A healthy meal plan will help you control your blood glucose and maintain a healthy lifestyle.  Working with a diet and nutrition specialist (dietitian) can help you make a meal plan that is best for you.  Keep in mind that carbohydrates (carbs) and alcohol have immediate effects on your blood glucose levels. It is important to count carbs and to use alcohol carefully. This information is not intended to replace advice given to you by your health care provider. Make sure you discuss any questions you have with your health care provider. Document Revised: 04/17/2017 Document Reviewed: 06/09/2016 Elsevier Patient Education  2020 [employer]  DASH Eating Plan DASH stands for "Dietary Approaches to Stop Hypertension." The DASH eating plan is a healthy eating plan that has been shown to reduce high blood pressure (hypertension). It may also reduce your risk for type 2   diabetes, heart disease, and stroke. The DASH eating plan may also help with weight loss. What are tips for following this plan?  General guidelines  Avoid eating more than 2,300 mg (milligrams) of salt (sodium) a day. If you have hypertension, you may need to reduce your sodium intake to 1,500 mg a day.  Limit alcohol intake to no more than 1 drink a day for nonpregnant women and 2 drinks a day for men. One drink equals 12 oz of beer, 5 oz of wine, or 1 oz of hard liquor.  Work with your health care provider to maintain a healthy body weight or to lose weight. Ask what an ideal weight is for you.  Get at least 30 minutes of exercise that causes your heart to beat faster (aerobic exercise) most days of the week. Activities may include walking, swimming, or biking.  Work with your health care provider or diet and  nutrition specialist (dietitian) to adjust your eating plan to your individual calorie needs. Reading food labels   Check food labels for the amount of sodium per serving. Choose foods with less than 5 percent of the Daily Value of sodium. Generally, foods with less than 300 mg of sodium per serving fit into this eating plan.  To find whole grains, look for the word "whole" as the first word in the ingredient list. Shopping  Buy products labeled as "low-sodium" or "no salt added."  Buy fresh foods. Avoid canned foods and premade or frozen meals. Cooking  Avoid adding salt when cooking. Use salt-free seasonings or herbs instead of table salt or sea salt. Check with your health care provider or pharmacist before using salt substitutes.  Do not fry foods. Cook foods using healthy methods such as baking, boiling, grilling, and broiling instead.  Cook with heart-healthy oils, such as olive, canola, soybean, or sunflower oil. Meal planning  Eat a balanced diet that includes: ? 5 or more servings of fruits and vegetables each day. At each meal, try to fill half of your plate with fruits and vegetables. ? Up to 6-8 servings of whole grains each day. ? Less than 6 oz of lean meat, poultry, or fish each day. A 3-oz serving of meat is about the same size as a deck of cards. One egg equals 1 oz. ? 2 servings of low-fat dairy each day. ? A serving of nuts, seeds, or beans 5 times each week. ? Heart-healthy fats. Healthy fats called Omega-3 fatty acids are found in foods such as flaxseeds and coldwater fish, like sardines, salmon, and mackerel.  Limit how much you eat of the following: ? Canned or prepackaged foods. ? Food that is high in trans fat, such as fried foods. ? Food that is high in saturated fat, such as fatty meat. ? Sweets, desserts, sugary drinks, and other foods with added sugar. ? Full-fat dairy products.  Do not salt foods before eating.  Try to eat at least 2 vegetarian  meals each week.  Eat more home-cooked food and less restaurant, buffet, and fast food.  When eating at a restaurant, ask that your food be prepared with less salt or no salt, if possible. What foods are recommended? The items listed may not be a complete list. Talk with your dietitian about what dietary choices are best for you. Grains Whole-grain or whole-wheat bread. Whole-grain or whole-wheat pasta. Brown rice. Oatmeal. Quinoa. Bulgur. Whole-grain and low-sodium cereals. Pita bread. Low-fat, low-sodium crackers. Whole-wheat flour tortillas. Vegetables Fresh or frozen vegetables (  raw, steamed, roasted, or grilled). Low-sodium or reduced-sodium tomato and vegetable juice. Low-sodium or reduced-sodium tomato sauce and tomato paste. Low-sodium or reduced-sodium canned vegetables. Fruits All fresh, dried, or frozen fruit. Canned fruit in natural juice (without added sugar). Meat and other protein foods Skinless chicken or turkey. Ground chicken or turkey. Pork with fat trimmed off. Fish and seafood. Egg whites. Dried beans, peas, or lentils. Unsalted nuts, nut butters, and seeds. Unsalted canned beans. Lean cuts of beef with fat trimmed off. Low-sodium, lean deli meat. Dairy Low-fat (1%) or fat-free (skim) milk. Fat-free, low-fat, or reduced-fat cheeses. Nonfat, low-sodium ricotta or cottage cheese. Low-fat or nonfat yogurt. Low-fat, low-sodium cheese. Fats and oils Soft margarine without trans fats. Vegetable oil. Low-fat, reduced-fat, or light mayonnaise and salad dressings (reduced-sodium). Canola, safflower, olive, soybean, and sunflower oils. Avocado. Seasoning and other foods Herbs. Spices. Seasoning mixes without salt. Unsalted popcorn and pretzels. Fat-free sweets. What foods are not recommended? The items listed may not be a complete list. Talk with your dietitian about what dietary choices are best for you. Grains Baked goods made with fat, such as croissants, muffins, or some  breads. Dry pasta or rice meal packs. Vegetables Creamed or fried vegetables. Vegetables in a cheese sauce. Regular canned vegetables (not low-sodium or reduced-sodium). Regular canned tomato sauce and paste (not low-sodium or reduced-sodium). Regular tomato and vegetable juice (not low-sodium or reduced-sodium). Pickles. Olives. Fruits Canned fruit in a light or heavy syrup. Fried fruit. Fruit in cream or butter sauce. Meat and other protein foods Fatty cuts of meat. Ribs. Fried meat. Bacon. Sausage. Bologna and other processed lunch meats. Salami. Fatback. Hotdogs. Bratwurst. Salted nuts and seeds. Canned beans with added salt. Canned or smoked fish. Whole eggs or egg yolks. Chicken or turkey with skin. Dairy Whole or 2% milk, cream, and half-and-half. Whole or full-fat cream cheese. Whole-fat or sweetened yogurt. Full-fat cheese. Nondairy creamers. Whipped toppings. Processed cheese and cheese spreads. Fats and oils Butter. Stick margarine. Lard. Shortening. Ghee. Bacon fat. Tropical oils, such as coconut, palm kernel, or palm oil. Seasoning and other foods Salted popcorn and pretzels. Onion salt, garlic salt, seasoned salt, table salt, and sea salt. Worcestershire sauce. Tartar sauce. Barbecue sauce. Teriyaki sauce. Soy sauce, including reduced-sodium. Steak sauce. Canned and packaged gravies. Fish sauce. Oyster sauce. Cocktail sauce. Horseradish that you find on the shelf. Ketchup. Mustard. Meat flavorings and tenderizers. Bouillon cubes. Hot sauce and Tabasco sauce. Premade or packaged marinades. Premade or packaged taco seasonings. Relishes. Regular salad dressings. Where to find more information:  National Heart, Lung, and Blood Institute: www.nhlbi.nih.gov  American Heart Association: www.heart.org Summary  The DASH eating plan is a healthy eating plan that has been shown to reduce high blood pressure (hypertension). It may also reduce your risk for type 2 diabetes, heart disease, and  stroke.  With the DASH eating plan, you should limit salt (sodium) intake to 2,300 mg a day. If you have hypertension, you may need to reduce your sodium intake to 1,500 mg a day.  When on the DASH eating plan, aim to eat more fresh fruits and vegetables, whole grains, lean proteins, low-fat dairy, and heart-healthy fats.  Work with your health care provider or diet and nutrition specialist (dietitian) to adjust your eating plan to your individual calorie needs. This information is not intended to replace advice given to you by your health care provider. Make sure you discuss any questions you have with your health care provider. Document Revised: 04/17/2017 Document Reviewed: 04/28/2016   Elsevier Patient Education  2020 [employer]   

## 2020-04-26 NOTE — Patient Instructions (Signed)
Diabetes Mellitus and Nutrition, Adult When you have diabetes (diabetes mellitus), it is very important to have healthy eating habits because your blood sugar (glucose) levels are greatly affected by what you eat and drink. Eating healthy foods in the appropriate amounts, at about the same times every day, can help you:  Control your blood glucose.  Lower your risk of heart disease.  Improve your blood pressure.  Reach or maintain a healthy weight. Every person with diabetes is different, and each person has different needs for a meal plan. Your health care provider may recommend that you work with a diet and nutrition specialist (dietitian) to make a meal plan that is best for you. Your meal plan may vary depending on factors such as:  The calories you need.  The medicines you take.  Your weight.  Your blood glucose, blood pressure, and cholesterol levels.  Your activity level.  Other health conditions you have, such as heart or kidney disease. How do carbohydrates affect me? Carbohydrates, also called carbs, affect your blood glucose level more than any other type of food. Eating carbs naturally raises the amount of glucose in your blood. Carb counting is a method for keeping track of how many carbs you eat. Counting carbs is important to keep your blood glucose at a healthy level, especially if you use insulin or take certain oral diabetes medicines. It is important to know how many carbs you can safely have in each meal. This is different for every person. Your dietitian can help you calculate how many carbs you should have at each meal and for each snack. Foods that contain carbs include:  Bread, cereal, rice, pasta, and crackers.  Potatoes and corn.  Peas, beans, and lentils.  Milk and yogurt. Fruit and juice.  Desserts, such as cakes, cookies, ice cream, and candy. How does alcohol affect me? Alcohol can cause a sudden decrease in blood glucose (hypoglycemia), especially  if you use insulin or take certain oral diabetes medicines. Hypoglycemia can be a life-threatening condition. Symptoms of hypoglycemia (sleepiness, dizziness, and confusion) are similar to symptoms of having too much alcohol. If your health care provider says that alcohol is safe for you, follow these guidelines:  Limit alcohol intake to no more than 1 drink per day for nonpregnant women and 2 drinks per day for men. One drink equals 12 oz of beer, 5 oz of wine, or 1 oz of hard liquor.  Do not drink on an empty stomach.  Keep yourself hydrated with water, diet soda, or unsweetened iced tea.  Keep in mind that regular soda, juice, and other mixers may contain a lot of sugar and must be counted as carbs. What are tips for following this plan?  Reading food labels  Start by checking the serving size on the "Nutrition Facts" label of packaged foods and drinks. The amount of calories, carbs, fats, and other nutrients listed on the label is based on one serving of the item. Many items contain more than one serving per package.  Check the total grams (g) of carbs in one serving. You can calculate the number of servings of carbs in one serving by dividing the total carbs by 15. For example, if a food has 30 g of total carbs, it would be equal to 2 servings of carbs.  Check the number of grams (g) of saturated and trans fats in one serving. Choose foods that have low or no amount of these fats.  Check the number of milligrams (  mg) of salt (sodium) in one serving. Most people should limit total sodium intake to less than 2,300 mg per day.  Always check the nutrition information of foods labeled as "low-fat" or "nonfat". These foods may be higher in added sugar or refined carbs and should be avoided.  Talk to your dietitian to identify your daily goals for nutrients listed on the label. Shopping  Avoid buying canned, premade, or processed foods. These foods tend to be high in fat, sodium, and added  sugar.  Shop around the outside edge of the grocery store. This includes fresh fruits and vegetables, bulk grains, fresh meats, and fresh dairy. Cooking  Use low-heat cooking methods, such as baking, instead of high-heat cooking methods like deep frying.  Cook using healthy oils, such as olive, canola, or sunflower oil.  Avoid cooking with butter, cream, or high-fat meats. Meal planning  Eat meals and snacks regularly, preferably at the same times every day. Avoid going long periods of time without eating.  Eat foods high in fiber, such as fresh fruits, vegetables, beans, and whole grains. Talk to your dietitian about how many servings of carbs you can eat at each meal.  Eat 4-6 ounces (oz) of lean protein each day, such as lean meat, chicken, fish, eggs, or tofu. One oz of lean protein is equal to: ? 1 oz of meat, chicken, or fish. ? 1 egg. ?  cup of tofu.  Eat some foods each day that contain healthy fats, such as avocado, nuts, seeds, and fish. Lifestyle  Check your blood glucose regularly.  Exercise regularly as told by your health care provider. This may include: ? 150 minutes of moderate-intensity or vigorous-intensity exercise each week. This could be brisk walking, biking, or water aerobics. ? Stretching and doing strength exercises, such as yoga or weightlifting, at least 2 times a week.  Take medicines as told by your health care provider.  Do not use any products that contain nicotine or tobacco, such as cigarettes and e-cigarettes. If you need help quitting, ask your health care provider.  Work with a counselor or diabetes educator to identify strategies to manage stress and any emotional and social challenges. Questions to ask a health care provider  Do I need to meet with a diabetes educator?  Do I need to meet with a dietitian?  What number can I call if I have questions?  When are the best times to check my blood glucose? Where to find more  information:  American Diabetes Association: diabetes.org  Academy of Nutrition and Dietetics: www.eatright.org  National Institute of Diabetes and Digestive and Kidney Diseases (NIH): www.niddk.nih.gov Summary  A healthy meal plan will help you control your blood glucose and maintain a healthy lifestyle.  Working with a diet and nutrition specialist (dietitian) can help you make a meal plan that is best for you.  Keep in mind that carbohydrates (carbs) and alcohol have immediate effects on your blood glucose levels. It is important to count carbs and to use alcohol carefully. This information is not intended to replace advice given to you by your health care provider. Make sure you discuss any questions you have with your health care provider. Document Revised: 04/17/2017 Document Reviewed: 06/09/2016 Elsevier Patient Education  2020 [employer]  DASH Eating Plan DASH stands for "Dietary Approaches to Stop Hypertension." The DASH eating plan is a healthy eating plan that has been shown to reduce high blood pressure (hypertension). It may also reduce your risk for type 2   diabetes, heart disease, and stroke. The DASH eating plan may also help with weight loss. What are tips for following this plan?  General guidelines  Avoid eating more than 2,300 mg (milligrams) of salt (sodium) a day. If you have hypertension, you may need to reduce your sodium intake to 1,500 mg a day.  Limit alcohol intake to no more than 1 drink a day for nonpregnant women and 2 drinks a day for men. One drink equals 12 oz of beer, 5 oz of wine, or 1 oz of hard liquor.  Work with your health care provider to maintain a healthy body weight or to lose weight. Ask what an ideal weight is for you.  Get at least 30 minutes of exercise that causes your heart to beat faster (aerobic exercise) most days of the week. Activities may include walking, swimming, or biking.  Work with your health care provider or diet and  nutrition specialist (dietitian) to adjust your eating plan to your individual calorie needs. Reading food labels   Check food labels for the amount of sodium per serving. Choose foods with less than 5 percent of the Daily Value of sodium. Generally, foods with less than 300 mg of sodium per serving fit into this eating plan.  To find whole grains, look for the word "whole" as the first word in the ingredient list. Shopping  Buy products labeled as "low-sodium" or "no salt added."  Buy fresh foods. Avoid canned foods and premade or frozen meals. Cooking  Avoid adding salt when cooking. Use salt-free seasonings or herbs instead of table salt or sea salt. Check with your health care provider or pharmacist before using salt substitutes.  Do not fry foods. Cook foods using healthy methods such as baking, boiling, grilling, and broiling instead.  Cook with heart-healthy oils, such as olive, canola, soybean, or sunflower oil. Meal planning  Eat a balanced diet that includes: ? 5 or more servings of fruits and vegetables each day. At each meal, try to fill half of your plate with fruits and vegetables. ? Up to 6-8 servings of whole grains each day. ? Less than 6 oz of lean meat, poultry, or fish each day. A 3-oz serving of meat is about the same size as a deck of cards. One egg equals 1 oz. ? 2 servings of low-fat dairy each day. ? A serving of nuts, seeds, or beans 5 times each week. ? Heart-healthy fats. Healthy fats called Omega-3 fatty acids are found in foods such as flaxseeds and coldwater fish, like sardines, salmon, and mackerel.  Limit how much you eat of the following: ? Canned or prepackaged foods. ? Food that is high in trans fat, such as fried foods. ? Food that is high in saturated fat, such as fatty meat. ? Sweets, desserts, sugary drinks, and other foods with added sugar. ? Full-fat dairy products.  Do not salt foods before eating.  Try to eat at least 2 vegetarian  meals each week.  Eat more home-cooked food and less restaurant, buffet, and fast food.  When eating at a restaurant, ask that your food be prepared with less salt or no salt, if possible. What foods are recommended? The items listed may not be a complete list. Talk with your dietitian about what dietary choices are best for you. Grains Whole-grain or whole-wheat bread. Whole-grain or whole-wheat pasta. Brown rice. Oatmeal. Quinoa. Bulgur. Whole-grain and low-sodium cereals. Pita bread. Low-fat, low-sodium crackers. Whole-wheat flour tortillas. Vegetables Fresh or frozen vegetables (  raw, steamed, roasted, or grilled). Low-sodium or reduced-sodium tomato and vegetable juice. Low-sodium or reduced-sodium tomato sauce and tomato paste. Low-sodium or reduced-sodium canned vegetables. Fruits All fresh, dried, or frozen fruit. Canned fruit in natural juice (without added sugar). Meat and other protein foods Skinless chicken or turkey. Ground chicken or turkey. Pork with fat trimmed off. Fish and seafood. Egg whites. Dried beans, peas, or lentils. Unsalted nuts, nut butters, and seeds. Unsalted canned beans. Lean cuts of beef with fat trimmed off. Low-sodium, lean deli meat. Dairy Low-fat (1%) or fat-free (skim) milk. Fat-free, low-fat, or reduced-fat cheeses. Nonfat, low-sodium ricotta or cottage cheese. Low-fat or nonfat yogurt. Low-fat, low-sodium cheese. Fats and oils Soft margarine without trans fats. Vegetable oil. Low-fat, reduced-fat, or light mayonnaise and salad dressings (reduced-sodium). Canola, safflower, olive, soybean, and sunflower oils. Avocado. Seasoning and other foods Herbs. Spices. Seasoning mixes without salt. Unsalted popcorn and pretzels. Fat-free sweets. What foods are not recommended? The items listed may not be a complete list. Talk with your dietitian about what dietary choices are best for you. Grains Baked goods made with fat, such as croissants, muffins, or some  breads. Dry pasta or rice meal packs. Vegetables Creamed or fried vegetables. Vegetables in a cheese sauce. Regular canned vegetables (not low-sodium or reduced-sodium). Regular canned tomato sauce and paste (not low-sodium or reduced-sodium). Regular tomato and vegetable juice (not low-sodium or reduced-sodium). Pickles. Olives. Fruits Canned fruit in a light or heavy syrup. Fried fruit. Fruit in cream or butter sauce. Meat and other protein foods Fatty cuts of meat. Ribs. Fried meat. Bacon. Sausage. Bologna and other processed lunch meats. Salami. Fatback. Hotdogs. Bratwurst. Salted nuts and seeds. Canned beans with added salt. Canned or smoked fish. Whole eggs or egg yolks. Chicken or turkey with skin. Dairy Whole or 2% milk, cream, and half-and-half. Whole or full-fat cream cheese. Whole-fat or sweetened yogurt. Full-fat cheese. Nondairy creamers. Whipped toppings. Processed cheese and cheese spreads. Fats and oils Butter. Stick margarine. Lard. Shortening. Ghee. Bacon fat. Tropical oils, such as coconut, palm kernel, or palm oil. Seasoning and other foods Salted popcorn and pretzels. Onion salt, garlic salt, seasoned salt, table salt, and sea salt. Worcestershire sauce. Tartar sauce. Barbecue sauce. Teriyaki sauce. Soy sauce, including reduced-sodium. Steak sauce. Canned and packaged gravies. Fish sauce. Oyster sauce. Cocktail sauce. Horseradish that you find on the shelf. Ketchup. Mustard. Meat flavorings and tenderizers. Bouillon cubes. Hot sauce and Tabasco sauce. Premade or packaged marinades. Premade or packaged taco seasonings. Relishes. Regular salad dressings. Where to find more information:  National Heart, Lung, and Blood Institute: www.nhlbi.nih.gov  American Heart Association: www.heart.org Summary  The DASH eating plan is a healthy eating plan that has been shown to reduce high blood pressure (hypertension). It may also reduce your risk for type 2 diabetes, heart disease, and  stroke.  With the DASH eating plan, you should limit salt (sodium) intake to 2,300 mg a day. If you have hypertension, you may need to reduce your sodium intake to 1,500 mg a day.  When on the DASH eating plan, aim to eat more fresh fruits and vegetables, whole grains, lean proteins, low-fat dairy, and heart-healthy fats.  Work with your health care provider or diet and nutrition specialist (dietitian) to adjust your eating plan to your individual calorie needs. This information is not intended to replace advice given to you by your health care provider. Make sure you discuss any questions you have with your health care provider. Document Revised: 04/17/2017 Document Reviewed: 04/28/2016   Elsevier Patient Education  2020 [employer]   

## 2020-04-26 NOTE — Progress Notes (Signed)
[facility] [place], [place] [address]  Internal MEDICINE  Office Visit Note  Patient Name: Holly Forbes  [id number]  [id number]  Date of Service: 04/30/2020  Chief Complaint  Patient presents with  . Hypertension  . Diabetes  . Hyperlipidemia   HPI  The patient is here for follow up visit. Blood pressure continues to be  elevated. Increased her hctz to two tablets daily. She noticed increased swelling of her legs as well. She is also on Lisinopril 20 mg ( 2 tabs) as well. Diabetic numbers are worse, has stopped taking metformin. Pt seems to be reluctant to therapy  BMI is high as well. Scheduled to have CPAP titration study  Current Medication: Outpatient Encounter Medications as of 04/26/2020  Medication Sig  . Accu-Chek Softclix Lancets lancets Use as instructed  . amLODipine (NORVASC) 2.5 MG tablet Take one tab po qhs for blood pressure  . APPLE CIDER VINEGAR PO Take 2 capsules by mouth daily.  Marland Kitchen aspirin 81 MG tablet Take 81 mg by mouth daily.   . Cholecalciferol (DIALYVITE VITAMIN D 5000) 125 MCG (5000 UT) capsule Take 5,000 Units by mouth daily.  . dorzolamide-timolol (COSOPT) 22.3-6.8 MG/ML ophthalmic solution Place 1 drop into both eyes 2 (two) times daily.   . furosemide (LASIX) 20 MG tablet Take 1 tablet (20 mg total) by mouth daily.  Marland Kitchen glucose blood (ACCU-CHEK GUIDE) test strip Use as instructed  . ibuprofen (ADVIL) 400 MG tablet Take 1 tablet (400 mg total) by mouth 2 (two) times daily as needed. (Patient taking differently: Take 400 mg by mouth 2 (two) times daily as needed for moderate pain. )  . latanoprost (XALATAN) 0.005 % ophthalmic solution Place 1 drop into both eyes at bedtime.   Marland Kitchen lisinopril (ZESTRIL) 20 MG tablet Take 2 tablets (40 mg total) by mouth daily. (Patient taking differently: Take 20 mg by mouth 2 (two) times daily. )  . pantoprazole (PROTONIX) 20 MG tablet Take 1 tablet (20 mg total) by mouth daily.  Marland Kitchen  PREDNISOLON-MOXIFLOX-BROMFENAC OP Place 1 drop into the right eye 2 (two) times daily.  . rosuvastatin (CRESTOR) 5 MG tablet Take 1 tablet (5 mg total) by mouth daily.  . sodium chloride (OCEAN) 0.65 % SOLN nasal spray Place 1 spray into both nostrils as needed for congestion.  . vitamin B-12 (CYANOCOBALAMIN) 500 MCG tablet Take 500 mcg by mouth daily.  . [DISCONTINUED] hydrochlorothiazide (HYDRODIURIL) 12.5 MG tablet Take 2 tablets (25 mg total) by mouth daily.   Facility-Administered Encounter Medications as of 04/26/2020  Medication  . triamcinolone acetonide (KENALOG) 10 MG/ML injection 10 mg    Surgical History: Past Surgical History:  Procedure Laterality Date  . ABDOMINAL HYSTERECTOMY    . BREAST EXCISIONAL BIOPSY Left 2015   cyst removed  . CATARACT EXTRACTION W/PHACO Right 03/27/2020   Procedure: CATARACT EXTRACTION PHACO AND INTRAOCULAR LENS PLACEMENT (IOC) RIGHT 5.03 00:50.6 9.9%;  Surgeon: Leandrew Koyanagi, MD;  Location: [place];  Service: Ophthalmology;  Laterality: Right;  . CATARACT EXTRACTION W/PHACO Left 04/18/2020   Procedure: CATARACT EXTRACTION PHACO AND INTRAOCULAR LENS PLACEMENT (IOC) LEFT DIABETIC 4.56 00:51.7 8.8%;  Surgeon: Leandrew Koyanagi, MD;  Location: [facility];  Service: Ophthalmology;  Laterality: Left;  . CHOLECYSTECTOMY    . COLONOSCOPY N/A 09/25/2014   Procedure: COLONOSCOPY;  Surgeon: Lucilla Lame, MD;  Location: [place];  Service: Gastroenterology;  Laterality: N/A;  cecum time- 2992  . CYST REMOVAL NECK    . ESOPHAGOGASTRODUODENOSCOPY  N/A 09/25/2014   Procedure: ESOPHAGOGASTRODUODENOSCOPY (EGD);  Surgeon: Lucilla Lame, MD;  Location: [place];  Service: Gastroenterology;  Laterality: N/A;  . ESOPHAGOGASTRODUODENOSCOPY (EGD) WITH PROPOFOL N/A 04/07/2018   Procedure: ESOPHAGOGASTRODUODENOSCOPY (EGD) WITH PROPOFOL;  Surgeon: Toledo, Benay Pike, MD;  Location: [facility];  Service: Gastroenterology;  Laterality: N/A;   . POLYPECTOMY  09/25/2014   Procedure: POLYPECTOMY INTESTINAL;  Surgeon: Lucilla Lame, MD;  Location: [place];  Service: Gastroenterology;;    Medical History: Past Medical History:  Diagnosis Date  . Abdominal pain   . 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 Back pain    Left sciatica, low back pain  . Cataract   . GERD (gastroesophageal reflux disease)   . Glaucoma   . Hypertension   . Vitamin D deficiency     Family History: Family History  Problem Relation Age of Onset  . Heart disease Sister   . Diabetes Sister   . Hypertension Sister   . Heart disease Brother   . Diabetes Brother   . Hypertension Brother   . Breast cancer Daughter 96  . Colon cancer Mother   . Stroke Mother   . Lung cancer Father     Social History   Socioeconomic History  . Marital status: Married    Spouse name: Not on file  . Number of children: Not on file  . Years of education: Not on file  . Highest education level: Not on file  Occupational History  . Not on file  Tobacco Use  . Smoking status: Former Smoker    Packs/day: 0.25    Years: 15.00    Pack years: 3.75    Types: Cigarettes    Quit date: 1995    Years since quitting: 26.9  . Smokeless tobacco: Never Used  Vaping Use  . Vaping Use: Never used  Substance and Sexual Activity  . Alcohol use: Yes    Comment: 3-5 glasses wine/month  . Drug use: Not Currently  . Sexual activity: Not on file  Other Topics Concern  . Not on file  Social History Narrative  . Not on file   Social Determinants of Health   Financial Resource Strain: Not on file  Food Insecurity: Not on file  Transportation Needs: Not on file  Physical Activity: Not on file  Stress: Not on file  Social Connections: Not on file  Intimate Partner Violence: Not on file      Review of Systems  Constitutional: Negative for chills and diaphoresis.  HENT: Negative for ear pain, postnasal drip and sinus pressure.   Eyes: Negative for  photophobia, discharge, redness, itching and visual disturbance.  Cardiovascular: Negative for leg swelling.  Gastrointestinal: Negative for constipation, diarrhea and vomiting.  Genitourinary: Negative for dysuria and flank pain.  Musculoskeletal: Negative for arthralgias, back pain and gait problem.  Skin: Negative for color change.  Allergic/Immunologic: Negative for environmental allergies and food allergies.  Neurological: Negative for dizziness.  Hematological: Does not bruise/bleed easily.  Psychiatric/Behavioral: Negative for agitation, behavioral problems (depression) and hallucinations.    Vital Signs: BP (!) 162/92   Pulse 77   Temp (!) 97.2 F (36.2 C)   Resp 16   Ht 5\' 7"  (1.702 m)   Wt 236 lb 3.2 oz (107.1 kg)   LMP  (LMP Unknown)   SpO2 98%   BMI 36.99 kg/m    Physical Exam Constitutional:      General: She is not in acute distress.  Appearance: She is well-developed and well-nourished. She is not diaphoretic.  HENT:     Head: Normocephalic and atraumatic.     Mouth/Throat:     Mouth: Oropharynx is clear and moist.     Pharynx: No oropharyngeal exudate.  Eyes:     Extraocular Movements: EOM normal.     Pupils: Pupils are equal, round, and reactive to light.  Neck:     Thyroid: No thyromegaly.     Vascular: No JVD.     Trachea: No tracheal deviation.  Cardiovascular:     Rate and Rhythm: Normal rate and regular rhythm.     Heart sounds: Normal heart sounds. No murmur heard. No friction rub. No gallop.   Pulmonary:     Effort: Pulmonary effort is normal. No respiratory distress.     Breath sounds: No wheezing or rales.  Chest:     Chest wall: No tenderness.  Abdominal:     General: Bowel sounds are normal.     Palpations: Abdomen is soft.  Musculoskeletal:        General: Normal range of motion.     Cervical back: Normal range of motion and neck supple.  Lymphadenopathy:     Cervical: No cervical adenopathy.  Skin:    General: Skin is warm  and dry.  Neurological:     Mental Status: She is alert and oriented to person, place, and time.     Cranial Nerves: No cranial nerve deficit.  Psychiatric:        Mood and Affect: Mood and affect normal.        Behavior: Behavior normal.        Thought Content: Thought content normal.        Judgment: Judgment normal.     Assessment/Plan: 1. Uncontrolled type 2 diabetes mellitus with hyperglycemia (HCC) Continues to have elevated hga1c, samples of Farxiga 5 mg po qd x 3 weeks and 10 mg po qd after that  - POCT UA - Microalbumin - Accu-Chek Softclix Lancets lancets; Use as instructed  Dispense: 100 each; Refill: 12 - glucose blood (ACCU-CHEK GUIDE) test strip; Use as instructed  Dispense: 100 each; Refill: 12  2. OSA (obstructive sleep apnea) She will need to have CPAP titration   3. Hyperlipidemia associated with type 2 diabetes mellitus (HCC) Continue Crestor 5 mg po qd   4. Essential hypertension Will add Lasix and dc hctz, continue Lisinopril as before, check bmp on next visit   5. BMI 36.0-36.9,adult Obesity Counseling: Risk Assessment: An assessment of behavioral risk factors was made today and includes lack of exercise sedentary lifestyle, lack of portion control and poor dietary habits.  Risk Modification Advice: She was counseled on portion control guidelines. Restricting daily caloric intake to 1800 ADA . The detrimental long term effects of obesity on her health and ongoing poor compliance was also discussed with the patient.   General Counseling: makiya jeune understanding of the findings of todays visit and agrees with plan of treatment. I have discussed any further diagnostic evaluation that may be needed or ordered today. We also reviewed her medications today. she has been encouraged to call the office with any questions or concerns that should arise related to todays visit.  Orders Placed This Encounter  Procedures  . POCT UA - Microalbumin    Meds  ordered this encounter  Medications  . Accu-Chek Softclix Lancets lancets    Sig: Use as instructed    Dispense:  100 each    Refill:  12  . glucose blood (ACCU-CHEK GUIDE) test strip    Sig: Use as instructed    Dispense:  100 each    Refill:  12  . furosemide (LASIX) 20 MG tablet    Sig: Take 1 tablet (20 mg total) by mouth daily.    Dispense:  30 tablet    Refill:  3  . amLODipine (NORVASC) 2.5 MG tablet    Sig: Take one tab po qhs for blood pressure    Dispense:  90 tablet    Refill:  3    Total time spent: 35 Minutes Time spent includes review of chart, medications, test results, and follow up plan with the patient.      Dr Lavera Guise Internal medicine

## 2020-04-26 NOTE — Telephone Encounter (Signed)
Mailed diet plan from [facility]

## 2020-04-26 NOTE — Progress Notes (Signed)
Pt given accu-chek and was given instructions on how to use.  I placed order for test strips and lancets to pharmacy

## 2020-04-30 MED ORDER — FUROSEMIDE 20 MG PO TABS: 20.0000 mg | ORAL_TABLET | Freq: Every day | ORAL | 3 refills | Status: DC

## 2020-04-30 MED ORDER — AMLODIPINE BESYLATE 2.5 MG PO TABS
ORAL_TABLET | ORAL | 3 refills | Status: DC
Start: 2020-04-30 — End: 2021-05-22

## 2020-05-03 NOTE — Progress Notes (Signed)
Appears mildly positive for sleep apnea

## 2020-05-24 ENCOUNTER — Other Ambulatory Visit: Payer: Self-pay

## 2020-05-24 DIAGNOSIS — E785 Hyperlipidemia, unspecified: Secondary | ICD-10-CM

## 2020-05-24 DIAGNOSIS — E1169 Type 2 diabetes mellitus with other specified complication: Secondary | ICD-10-CM

## 2020-05-24 MED ORDER — ROSUVASTATIN CALCIUM 5 MG PO TABS: 5.0000 mg | ORAL_TABLET | Freq: Every day | ORAL | 3 refills | Status: DC

## 2020-05-29 ENCOUNTER — Other Ambulatory Visit: Payer: Self-pay

## 2020-05-29 ENCOUNTER — Encounter: Payer: Self-pay | Admitting: Internal Medicine

## 2020-05-29 ENCOUNTER — Ambulatory Visit (INDEPENDENT_AMBULATORY_CARE_PROVIDER_SITE_OTHER): Payer: PPO | Admitting: Internal Medicine

## 2020-05-29 DIAGNOSIS — E1165 Type 2 diabetes mellitus with hyperglycemia: Secondary | ICD-10-CM

## 2020-05-29 DIAGNOSIS — G4733 Obstructive sleep apnea (adult) (pediatric): Secondary | ICD-10-CM | POA: Diagnosis not present

## 2020-05-29 DIAGNOSIS — E1169 Type 2 diabetes mellitus with other specified complication: Secondary | ICD-10-CM | POA: Diagnosis not present

## 2020-05-29 DIAGNOSIS — E785 Hyperlipidemia, unspecified: Secondary | ICD-10-CM | POA: Diagnosis not present

## 2020-05-29 DIAGNOSIS — I10 Essential (primary) hypertension: Secondary | ICD-10-CM | POA: Diagnosis not present

## 2020-05-29 LAB — POCT GLYCOSYLATED HEMOGLOBIN (HGB A1C): Hemoglobin A1C: 7.3 % — AB (ref 4.0–5.6)

## 2020-05-29 MED ORDER — LISINOPRIL 40 MG PO TABS: 40.0000 mg | ORAL_TABLET | Freq: Every day | ORAL | 3 refills | Status: DC

## 2020-05-29 MED ORDER — IBUPROFEN 400 MG PO TABS: ORAL_TABLET | ORAL | 2 refills | Status: DC

## 2020-05-29 MED ORDER — IBUPROFEN 400 MG PO TABS: ORAL_TABLET | ORAL | 2 refills | Status: AC

## 2020-05-29 MED ORDER — CANAGLIFLOZIN 300 MG PO TABS: 300.0000 mg | ORAL_TABLET | Freq: Every day | ORAL | 3 refills | Status: DC
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
## 2020-05-30 ENCOUNTER — Other Ambulatory Visit: Payer: Self-pay

## 2020-05-30 DIAGNOSIS — E1165 Type 2 diabetes mellitus with hyperglycemia: Secondary | ICD-10-CM

## 2020-05-30 MED ORDER — ACCU-CHEK SOFTCLIX LANCETS MISC: 12 refills | Status: DC

## 2020-05-30 MED ORDER — ACCU-CHEK GUIDE VI STRP: ORAL_STRIP | 12 refills | Status: DC

## 2020-05-31 ENCOUNTER — Other Ambulatory Visit: Payer: Self-pay

## 2020-06-01 ENCOUNTER — Other Ambulatory Visit: Payer: Self-pay

## 2020-06-01 MED ORDER — ONETOUCH DELICA LANCETS 30G MISC: 12 refills | Status: DC

## 2020-06-01 MED ORDER — ONETOUCH VERIO FLEX SYSTEM W/DEVICE KIT: PACK | 0 refills | Status: AC

## 2020-06-01 MED ORDER — ONETOUCH VERIO VI STRP: ORAL_STRIP | 12 refills | Status: DC

## 2020-06-22 ENCOUNTER — Encounter: Payer: Self-pay | Admitting: Hospice and Palliative Medicine

## 2020-06-22 ENCOUNTER — Ambulatory Visit (INDEPENDENT_AMBULATORY_CARE_PROVIDER_SITE_OTHER): Payer: PPO | Admitting: Hospice and Palliative Medicine

## 2020-06-22 ENCOUNTER — Other Ambulatory Visit: Payer: Self-pay

## 2020-06-22 VITALS — BP 124/71 | HR 91 | Temp 97.5°F | Resp 16 | Ht 67.0 in | Wt 226.0 lb

## 2020-06-22 DIAGNOSIS — Z1231 Encounter for screening mammogram for malignant neoplasm of breast: Secondary | ICD-10-CM

## 2020-06-22 DIAGNOSIS — E1165 Type 2 diabetes mellitus with hyperglycemia: Secondary | ICD-10-CM | POA: Diagnosis not present

## 2020-06-22 DIAGNOSIS — E6609 Other obesity due to excess calories: Secondary | ICD-10-CM

## 2020-06-22 DIAGNOSIS — I10 Essential (primary) hypertension: Secondary | ICD-10-CM | POA: Diagnosis not present

## 2020-06-22 DIAGNOSIS — Z1211 Encounter for screening for malignant neoplasm of colon: Secondary | ICD-10-CM

## 2020-06-22 DIAGNOSIS — Z6835 Body mass index (BMI) 35.0-35.9, adult: Secondary | ICD-10-CM

## 2020-06-22 NOTE — Progress Notes (Signed)
[facility] [place], [place] [address]  Internal MEDICINE  Office Visit Note  Patient Name: Holly Forbes  [id number]  [id number]  Date of Service: 06/26/2020  Chief Complaint  Patient presents with  . Follow-up  . Diabetes  . Gastroesophageal Reflux  . Hypertension    HPI Patient is here for routine follow-up DM-glucose levels have been well controlled, home glucose monitoring averaging 100-130's Tolerating Invokana well Noted further 6 pound weight loss She has been monitoring her intake of carbs and sugar--counting calories Has not received APAP yet--will look into this for her  Due for screening mammogram and colonoscopy  Current Medication: Outpatient Encounter Medications as of 06/22/2020  Medication Sig  . glucose blood (ONETOUCH VERIO) test strip Use as directed once daily DX E 11.65  . amLODipine (NORVASC) 2.5 MG tablet Take one tab po qhs for blood pressure  . APPLE CIDER VINEGAR PO Take 2 capsules by mouth daily.  Marland Kitchen aspirin 81 MG tablet Take 81 mg by mouth daily.   . Blood Glucose Monitoring Suppl (ONETOUCH VERIO FLEX SYSTEM) w/Device KIT Use as directed DX E11.65  . canagliflozin (INVOKANA) 300 MG TABS tablet Take 1 tablet (300 mg total) by mouth daily before breakfast.  . Cholecalciferol (DIALYVITE VITAMIN D 5000) 125 MCG (5000 UT) capsule Take 5,000 Units by mouth daily.  . dorzolamide-timolol (COSOPT) 22.3-6.8 MG/ML ophthalmic solution Place 1 drop into both eyes 2 (two) times daily.   . furosemide (LASIX) 20 MG tablet Take 1 tablet (20 mg total) by mouth daily.  Marland Kitchen ibuprofen (ADVIL) 400 MG tablet Take one tab po bid prn for pain  . latanoprost (XALATAN) 0.005 % ophthalmic solution Place 1 drop into both eyes at bedtime.   Marland Kitchen lisinopril (ZESTRIL) 40 MG tablet Take 1 tablet (40 mg total) by mouth daily.  Glory Rosebush Delica Lancets 65K MISC Use as directed once a daily DX e11.65  . pantoprazole (PROTONIX) 20 MG tablet Take 1 tablet  (20 mg total) by mouth daily.  Marland Kitchen PREDNISOLON-MOXIFLOX-BROMFENAC OP Place 1 drop into the right eye 2 (two) times daily.  . rosuvastatin (CRESTOR) 5 MG tablet Take 1 tablet (5 mg total) by mouth daily.  . sodium chloride (OCEAN) 0.65 % SOLN nasal spray Place 1 spray into both nostrils as needed for congestion.  . vitamin B-12 (CYANOCOBALAMIN) 500 MCG tablet Take 500 mcg by mouth daily.   Facility-Administered Encounter Medications as of 06/22/2020  Medication  . triamcinolone acetonide (KENALOG) 10 MG/ML injection 10 mg    Surgical History: Past Surgical History:  Procedure Laterality Date  . ABDOMINAL HYSTERECTOMY    . BREAST EXCISIONAL BIOPSY Left 2015   cyst removed  . CATARACT EXTRACTION W/PHACO Right 03/27/2020   Procedure: CATARACT EXTRACTION PHACO AND INTRAOCULAR LENS PLACEMENT (IOC) RIGHT 5.03 00:50.6 9.9%;  Surgeon: Leandrew Koyanagi, MD;  Location: [place];  Service: Ophthalmology;  Laterality: Right;  . CATARACT EXTRACTION W/PHACO Left 04/18/2020   Procedure: CATARACT EXTRACTION PHACO AND INTRAOCULAR LENS PLACEMENT (IOC) LEFT DIABETIC 4.56 00:51.7 8.8%;  Surgeon: Leandrew Koyanagi, MD;  Location: [facility];  Service: Ophthalmology;  Laterality: Left;  . CHOLECYSTECTOMY    . COLONOSCOPY N/A 09/25/2014   Procedure: COLONOSCOPY;  Surgeon: Lucilla Lame, MD;  Location: [place];  Service: Gastroenterology;  Laterality: N/A;  cecum time- 3546  . CYST REMOVAL NECK    . ESOPHAGOGASTRODUODENOSCOPY N/A 09/25/2014   Procedure: ESOPHAGOGASTRODUODENOSCOPY (EGD);  Surgeon: Lucilla Lame, MD;  Location: [place];  Service:  Gastroenterology;  Laterality: N/A;  . ESOPHAGOGASTRODUODENOSCOPY (EGD) WITH PROPOFOL N/A 04/07/2018   Procedure: ESOPHAGOGASTRODUODENOSCOPY (EGD) WITH PROPOFOL;  Surgeon: Toledo, Benay Pike, MD;  Location: [facility];  Service: Gastroenterology;  Laterality: N/A;  . POLYPECTOMY  09/25/2014   Procedure: POLYPECTOMY INTESTINAL;  Surgeon: Lucilla Lame, MD;  Location: [place];  Service: Gastroenterology;;    Medical History: Past Medical History:  Diagnosis Date  . Abdominal pain   . 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 Back pain    Left sciatica, low back pain  . Cataract   . Diabetes mellitus without complication ([place])   . GERD (gastroesophageal reflux disease)   . Glaucoma   . Hypertension   . Vitamin D deficiency     Family History: Family History  Problem Relation Age of Onset  . Heart disease Sister   . Diabetes Sister   . Hypertension Sister   . Heart disease Brother   . Diabetes Brother   . Hypertension Brother   . Breast cancer Daughter 43  . Colon cancer Mother   . Stroke Mother   . Lung cancer Father     Social History   Socioeconomic History  . Marital status: Married    Spouse name: Not on file  . Number of children: Not on file  . Years of education: Not on file  . Highest education level: Not on file  Occupational History  . Not on file  Tobacco Use  . Smoking status: Former Smoker    Packs/day: 0.25    Years: 15.00    Pack years: 3.75    Types: Cigarettes    Quit date: 1995    Years since quitting: 27.1  . Smokeless tobacco: Never Used  Vaping Use  . Vaping Use: Never used  Substance and Sexual Activity  . Alcohol use: Yes    Comment: 3-5 glasses wine/month  . Drug use: Not Currently  . Sexual activity: Not on file  Other Topics Concern  . Not on file  Social History Narrative  . Not on file   Social Determinants of Health   Financial Resource Strain: Not on file  Food Insecurity: Not on file  Transportation Needs: Not on file  Physical Activity: Not on file  Stress: Not on file  Social Connections: Not on file  Intimate Partner Violence: Not on file      Review of Systems  Constitutional: Negative for chills, diaphoresis and fatigue.  HENT: Negative for ear pain, postnasal drip and sinus pressure.   Eyes: Negative for photophobia, discharge,  redness, itching and visual disturbance.  Respiratory: Negative for cough, shortness of breath and wheezing.   Cardiovascular: Negative for chest pain, palpitations and leg swelling.  Gastrointestinal: Negative for abdominal pain, constipation, diarrhea, nausea and vomiting.  Genitourinary: Negative for dysuria and flank pain.  Musculoskeletal: Negative for arthralgias, back pain, gait problem and neck pain.  Skin: Negative for color change.  Allergic/Immunologic: Negative for environmental allergies and food allergies.  Neurological: Negative for dizziness and headaches.  Hematological: Does not bruise/bleed easily.  Psychiatric/Behavioral: Negative for agitation, behavioral problems (depression) and hallucinations.    Vital Signs: BP 124/71   Pulse 91   Temp (!) 97.5 F (36.4 C)   Resp 16   Ht _0  (1.702 m)   Wt 226 lb (102.5 kg)   LMP  (LMP Unknown)   SpO2 98%   BMI 35.40 kg/m    Physical Exam Vitals reviewed.  Constitutional:  Appearance: Normal appearance. She is obese. She is not ill-appearing.  Cardiovascular:     Rate and Rhythm: Normal rate and regular rhythm.     Pulses: Normal pulses.     Heart sounds: Normal heart sounds.  Pulmonary:     Effort: Pulmonary effort is normal.     Breath sounds: Normal breath sounds.  Abdominal:     General: Abdomen is flat.     Palpations: Abdomen is soft.  Musculoskeletal:        General: Normal range of motion.     Cervical back: Normal range of motion.  Skin:    General: Skin is warm.  Neurological:     General: No focal deficit present.     Mental Status: She is alert and oriented to person, place, and time. Mental status is at baseline.  Psychiatric:        Mood and Affect: Mood normal.        Behavior: Behavior normal.        Thought Content: Thought content normal.        Judgment: Judgment normal.    Assessment/Plan: 1. Uncontrolled type 2 diabetes mellitus with hyperglycemia (HCC) Glucose readings  improved Continue with current therapy and routine A1C monitoring  2. Essential hypertension BP and HR better controlled today, continue with monitoring  3. Screening for colon cancer - Ambulatory referral to Gastroenterology  4. Encounter for screening mammogram for malignant neoplasm of breast - MM Digital Screening; Future  5. Class 2 obesity due to excess calories with body mass index (BMI) of 35.0 to 35.9 in adult, unspecified whether serious comorbidity present Praised for recent weight loss Encouraged to continue with healthy lifestyle habits Obesity Counseling: Risk Assessment: An assessment of behavioral risk factors was made today and includes lack of exercise sedentary lifestyle, lack of portion control and poor dietary habits.  Risk Modification Advice: She was counseled on portion control guidelines. Restricting daily caloric intake to 1800. The detrimental long term effects of obesity on her health and ongoing poor compliance was also discussed with the patient.  General Counseling: Holly Forbes understanding of the findings of todays visit and agrees with plan of treatment. I have discussed any further diagnostic evaluation that may be needed or ordered today. We also reviewed her medications today. she has been encouraged to call the office with any questions or concerns that should arise related to todays visit.    Orders Placed This Encounter  Procedures  . MM Digital Screening  . Ambulatory referral to Gastroenterology      Time spent: 30 Minutes Time spent includes review of chart, medications, test results and follow-up plan with the patient.  This patient was seen by Theodoro Grist AGNP-C in Collaboration with Dr Lavera Guise as a part of collaborative care agreement     Tanna Furry. Jaelyne Deeg AGNP-C Internal medicine

## 2020-06-26 ENCOUNTER — Encounter: Payer: Self-pay | Admitting: Hospice and Palliative Medicine

## 2020-06-28 ENCOUNTER — Telehealth (INDEPENDENT_AMBULATORY_CARE_PROVIDER_SITE_OTHER): Payer: Self-pay | Admitting: Gastroenterology

## 2020-06-28 ENCOUNTER — Other Ambulatory Visit: Payer: Self-pay

## 2020-06-28 DIAGNOSIS — Z86010 Personal history of colonic polyps: Secondary | ICD-10-CM

## 2020-06-28 MED ORDER — NA SULFATE-K SULFATE-MG SULF 17.5-3.13-1.6 GM/177ML PO SOLN: 1.0000 | Freq: Once | ORAL | 0 refills | Status: AC

## 2020-06-28 NOTE — Progress Notes (Signed)
Gastroenterology Pre-Procedure Review  Request Date: Thursday 07/12/20 Requesting Physician: Dr. Allen Norris  Holly Forbes REVIEW QUESTIONS: The Holly Forbes responded to the following health history questions as indicated:    1. Are you having any GI issues? yes (occasional reflux) 2. Do you have a personal history of Polyps? yes (09/25/14 performed by Dr. Allen Norris) 3. Do you have a family history of Colon Cancer or Polyps? yes (mother and sister colon cancer) 4. Diabetes Mellitus? yes (type 2) 5. Joint replacements in the past 12 months?no 6. Major health problems in the past 3 months?no 7. Any artificial heart valves, MVP, or defibrillator?no    MEDICATIONS & ALLERGIES:    Holly Forbes reports the following regarding taking any anticoagulation/antiplatelet therapy:   Plavix, Coumadin, Eliquis, Xarelto, Lovenox, Pradaxa, Brilinta, or Effient? no Aspirin? 81 mg daily  Holly Forbes confirms/reports the following medications:  Current Outpatient Medications  Medication Sig Dispense Refill  . amLODipine (NORVASC) 2.5 MG tablet Take one tab po qhs for blood pressure 90 tablet 3  . aspirin 81 MG tablet Take 81 mg by mouth daily.     . Blood Glucose Monitoring Suppl (ONETOUCH VERIO FLEX SYSTEM) w/Device KIT Use as directed DX E11.65 1 kit 0  . canagliflozin (INVOKANA) 300 MG TABS tablet Take 1 tablet (300 mg total) by mouth daily before breakfast. 90 tablet 3  . Cholecalciferol (DIALYVITE VITAMIN D 5000) 125 MCG (5000 UT) capsule Take 5,000 Units by mouth daily.    . dorzolamide-timolol (COSOPT) 22.3-6.8 MG/ML ophthalmic solution Place 1 drop into both eyes 2 (two) times daily.   5  . furosemide (LASIX) 20 MG tablet Take 1 tablet (20 mg total) by mouth daily. 30 tablet 3  . glucose blood (ONETOUCH VERIO) test strip Use as directed once daily DX E 11.65 100 each 12  . ibuprofen (ADVIL) 400 MG tablet Take one tab po bid prn for pain 60 tablet 2  . latanoprost (XALATAN) 0.005 % ophthalmic solution Place 1 drop into both  eyes at bedtime.   3  . lisinopril (ZESTRIL) 40 MG tablet Take 1 tablet (40 mg total) by mouth daily. 90 tablet 3  . Na Sulfate-K Sulfate-Mg Sulf 17.5-3.13-1.6 GM/177ML SOLN Take 1 kit by mouth once for 1 dose. 354 mL 0  . OneTouch Delica Lancets 32K MISC Use as directed once a daily DX e11.65 100 each 12  . pantoprazole (PROTONIX) 20 MG tablet Take 1 tablet (20 mg total) by mouth daily. 90 tablet 1  . rosuvastatin (CRESTOR) 5 MG tablet Take 1 tablet (5 mg total) by mouth daily. 30 tablet 3  . sodium chloride (OCEAN) 0.65 % SOLN nasal spray Place 1 spray into both nostrils as needed for congestion.    . vitamin B-12 (CYANOCOBALAMIN) 500 MCG tablet Take 500 mcg by mouth daily.    . APPLE CIDER VINEGAR PO Take 2 capsules by mouth daily. (Holly Forbes not taking: Reported on 06/28/2020)    . PREDNISOLON-MOXIFLOX-BROMFENAC OP Place 1 drop into the right eye 2 (two) times daily. (Holly Forbes not taking: Reported on 06/28/2020)     Current Facility-Administered Medications  Medication Dose Route Frequency Provider Last Rate Last Admin  . triamcinolone acetonide (KENALOG) 10 MG/ML injection 10 mg  10 mg Other Once Landis Martins, DPM        Holly Forbes confirms/reports the following allergies:  No Known Allergies  Orders Placed This Encounter  Procedures  . Procedural/ Surgical Case Request: COLONOSCOPY WITH PROPOFOL    Standing Status:   Standing    Number of  Occurrences:   1    Order Specific Question:   Pre-op diagnosis    Answer:   personal history of colon polyps    Order Specific Question:   CPT Code    Answer:   80998    AUTHORIZATION INFORMATION Primary Insurance: 1D#: Group #:  Secondary Insurance: 1D#: Group #:  SCHEDULE INFORMATION: Date: 07/12/20 Time: Location:[facility]

## 2020-07-02 ENCOUNTER — Other Ambulatory Visit: Payer: Self-pay

## 2020-07-02 DIAGNOSIS — K219 Gastro-esophageal reflux disease without esophagitis: Secondary | ICD-10-CM

## 2020-07-02 MED ORDER — PANTOPRAZOLE SODIUM 20 MG PO TBEC: 20.0000 mg | DELAYED_RELEASE_TABLET | Freq: Every day | ORAL | 1 refills | Status: DC

## 2020-07-12 ENCOUNTER — Other Ambulatory Visit
Admission: RE | Admit: 2020-07-12 | Discharge: 2020-07-12 | Disposition: A | Discharge status: Home / Self Care | Payer: PPO | Source: Ambulatory Visit | Attending: Gastroenterology | Admitting: Gastroenterology

## 2020-07-12 ENCOUNTER — Other Ambulatory Visit: Payer: Self-pay

## 2020-07-12 DIAGNOSIS — Z01812 Encounter for preprocedural laboratory examination: Secondary | ICD-10-CM | POA: Insufficient documentation

## 2020-07-12 DIAGNOSIS — Z20822 Contact with and (suspected) exposure to covid-19: Secondary | ICD-10-CM | POA: Insufficient documentation

## 2020-07-13 ENCOUNTER — Other Ambulatory Visit: Payer: PPO

## 2020-07-13 LAB — SARS CORONAVIRUS 2 (TAT 6-24 HRS): SARS Coronavirus 2: NEGATIVE

## 2020-07-16 ENCOUNTER — Ambulatory Visit
Admission: RE | Admit: 2020-07-16 | Discharge: 2020-07-16 | Disposition: A | Discharge status: Home / Self Care | Payer: PPO | Attending: Gastroenterology | Admitting: Gastroenterology

## 2020-07-16 ENCOUNTER — Encounter: Payer: Self-pay | Admitting: Gastroenterology

## 2020-07-16 ENCOUNTER — Encounter
Admission: RE | Disposition: A | Discharge status: Home / Self Care | Payer: Self-pay | Source: Home / Self Care | Attending: Gastroenterology

## 2020-07-16 ENCOUNTER — Ambulatory Visit: Payer: PPO | Admitting: Certified Registered"

## 2020-07-16 DIAGNOSIS — K635 Polyp of colon: Secondary | ICD-10-CM | POA: Diagnosis not present

## 2020-07-16 DIAGNOSIS — Z87891 Personal history of nicotine dependence: Secondary | ICD-10-CM | POA: Diagnosis not present

## 2020-07-16 DIAGNOSIS — Z800 Family history of malignant neoplasm of digestive organs: Secondary | ICD-10-CM | POA: Insufficient documentation

## 2020-07-16 DIAGNOSIS — Z1211 Encounter for screening for malignant neoplasm of colon: Secondary | ICD-10-CM | POA: Diagnosis not present

## 2020-07-16 DIAGNOSIS — Z86010 Personal history of colonic polyps: Secondary | ICD-10-CM

## 2020-07-16 DIAGNOSIS — Z79899 Other long term (current) drug therapy: Secondary | ICD-10-CM | POA: Insufficient documentation

## 2020-07-16 DIAGNOSIS — D123 Benign neoplasm of transverse colon: Secondary | ICD-10-CM | POA: Insufficient documentation

## 2020-07-16 DIAGNOSIS — K648 Other hemorrhoids: Secondary | ICD-10-CM | POA: Diagnosis not present

## 2020-07-16 DIAGNOSIS — Z7982 Long term (current) use of aspirin: Secondary | ICD-10-CM | POA: Diagnosis not present

## 2020-07-16 HISTORY — PX: COLONOSCOPY WITH PROPOFOL: SHX5780

## 2020-07-16 LAB — GLUCOSE, CAPILLARY: Glucose-Capillary: 110 mg/dL — ABNORMAL HIGH (ref 70–99)

## 2020-07-16 SURGERY — COLONOSCOPY WITH PROPOFOL
Anesthesia: General

## 2020-07-16 MED ORDER — SODIUM CHLORIDE 0.9 % IV SOLN: INTRAVENOUS | Status: DC

## 2020-07-16 MED ORDER — PROPOFOL 10 MG/ML IV BOLUS
INTRAVENOUS | Status: DC | PRN
  Administered 2020-07-16: 20 mg via INTRAVENOUS
  Administered 2020-07-16: 50 mg via INTRAVENOUS

## 2020-07-16 MED ORDER — LIDOCAINE HCL (CARDIAC) PF 100 MG/5ML IV SOSY
PREFILLED_SYRINGE | INTRAVENOUS | Status: DC | PRN
  Administered 2020-07-16: 50 mg via INTRAVENOUS

## 2020-07-16 MED ORDER — PROPOFOL 500 MG/50ML IV EMUL
INTRAVENOUS | Status: DC | PRN
  Administered 2020-07-16: 125 ug/kg/min via INTRAVENOUS

## 2020-07-16 MED ORDER — PROPOFOL 500 MG/50ML IV EMUL
INTRAVENOUS | Status: AC
  Filled 2020-07-16: qty 50

## 2020-07-16 NOTE — Anesthesia Postprocedure Evaluation (Signed)
Anesthesia Post Note  Patient: Holly Forbes  Procedure(s) Performed: COLONOSCOPY WITH PROPOFOL (N/A )  Patient location during evaluation: Endoscopy Anesthesia Type: General Level of consciousness: awake and alert Pain management: pain level controlled Vital Signs Assessment: post-procedure vital signs reviewed and stable Respiratory status: spontaneous breathing, nonlabored ventilation, respiratory function stable and patient connected to nasal cannula oxygen Cardiovascular status: blood pressure returned to baseline and stable Postop Assessment: no apparent nausea or vomiting Anesthetic complications: no   No complications documented.   Last Vitals:  Vitals:   07/16/20 0954 07/16/20 1004  BP: 136/60 126/70  Pulse: 82 69  Resp: (!) 21 14  Temp:    SpO2: 99% 98%    Last Pain:  Vitals:   07/16/20 1004  TempSrc:   PainSc: 0-No pain                 Arita Miss

## 2020-07-16 NOTE — Anesthesia Preprocedure Evaluation (Signed)
Anesthesia Evaluation  Patient identified by MRN, date of birth, ID band Patient awake    Reviewed: Allergy & Precautions, H&P , NPO status , Patient's Chart, lab work & pertinent test results  History of Anesthesia Complications Negative for: history of anesthetic complications  Airway Mallampati: II  TM Distance: >3 FB Neck ROM: full    Dental no notable dental hx. (+) Teeth Intact   Pulmonary sleep apnea , neg COPD, Patient abstained from smoking.Not current smoker, former smoker,  Mild OSA, Has not received her CPAP yet   Pulmonary exam normal breath sounds clear to auscultation       Cardiovascular Exercise Tolerance: Good METShypertension, (-) CAD and (-) Past MI Normal cardiovascular exam(-) dysrhythmias  Rhythm:regular Rate:Normal     Neuro/Psych negative neurological ROS  negative psych ROS   GI/Hepatic Neg liver ROS, GERD  Medicated,  Endo/Other  diabetes  Renal/GU negative Renal ROS  negative genitourinary   Musculoskeletal  (+) Arthritis , Osteoarthritis,    Abdominal   Peds negative pediatric ROS (+)  Hematology negative hematology ROS (+)   Anesthesia Other Findings Past Medical History: No date: Abdominal pain No date: Arthritis     Comment:  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 No date: Back pain     Comment:  Left sciatica, low back pain No date: Cataract No date: Diabetes mellitus without complication (HCC) No date: GERD (gastroesophageal reflux disease) No date: Glaucoma No date: Hypertension No date: Vitamin D deficiency  Reproductive/Obstetrics                             Anesthesia Physical  Anesthesia Plan  ASA: II  Anesthesia Plan: General   Post-op Pain Management:    Induction: Intravenous  PONV Risk Score and Plan: 3 and Treatment may vary due to age or medical condition, TIVA and Propofol infusion  Airway Management Planned: Nasal Cannula  Additional  Equipment: None  Intra-op Plan:   Post-operative Plan:   Informed Consent: I have reviewed the patients History and Physical, chart, labs and discussed the procedure including the risks, benefits and alternatives for the proposed anesthesia with the patient or authorized representative who has indicated his/her understanding and acceptance.     Dental Advisory Given  Plan Discussed with: CRNA  Anesthesia Plan Comments: (Discussed risks of anesthesia with patient, including possibility of difficulty with spontaneous ventilation under anesthesia necessitating airway intervention, PONV, and rare risks such as cardiac or respiratory or neurological events. Patient understands.)        Anesthesia Quick Evaluation

## 2020-07-16 NOTE — Op Note (Signed)
[facility] Gastroenterology Patient Name: Holly Forbes Procedure Date: 07/16/2020 9:24 AM MRN: [id number] Account #: [id number] Date of Birth: [birth year] Admit Type: Outpatient Age: 70 Room: [facility] ENDO ROOM 1 Gender: Female Note Status: Finalized Procedure:             Colonoscopy Indications:           Screening in patient at increased risk: Colorectal                         cancer in mother 82 or older, Screening in patient at                         increased risk: Colorectal cancer in sister before age                         8, Last colonoscopy: May 2016 Providers:             Lin Landsman MD, MD Referring MD:          Lavera Guise, MD (Referring MD) Medicines:             General Anesthesia Complications:         No immediate complications. Estimated blood loss: None. Procedure:             Pre-Anesthesia Assessment:                        - Prior to the procedure, a History and Physical was                         performed, and patient medications and allergies were                         reviewed. The patient is competent. The risks and                         benefits of the procedure and the sedation options and                         risks were discussed with the patient. All questions                         were answered and informed consent was obtained.                         Patient identification and proposed procedure were                         verified by the physician, the nurse, the                         anesthesiologist, the anesthetist and the technician                         in the pre-procedure area in the procedure room in the                         endoscopy suite. Mental Status Examination: alert and  oriented. Airway Examination: normal oropharyngeal                         airway and neck mobility. Respiratory Examination:                         clear to auscultation. CV Examination:  normal.                         Prophylactic Antibiotics: The patient does not require                         prophylactic antibiotics. Prior Anticoagulants: The                         patient has taken no previous anticoagulant or                         antiplatelet agents. ASA Grade Assessment: II - A                         patient with mild systemic disease. After reviewing                         the risks and benefits, the patient was deemed in                         satisfactory condition to undergo the procedure. The                         anesthesia plan was to use general anesthesia.                         Immediately prior to administration of medications,                         the patient was re-assessed for adequacy to receive                         sedatives. The heart rate, respiratory rate, oxygen                         saturations, blood pressure, adequacy of pulmonary                         ventilation, and response to care were monitored                         throughout the procedure. The physical status of the                         patient was re-assessed after the procedure.                        After obtaining informed consent, the colonoscope was                         passed under direct vision. Throughout the procedure,  the patient's blood pressure, pulse, and oxygen                         saturations were monitored continuously. The                         Colonoscope was introduced through the anus and                         advanced to the the cecum, identified by appendiceal                         orifice and ileocecal valve. The colonoscopy was                         performed without difficulty. The patient tolerated                         the procedure well. The quality of the bowel                         preparation was evaluated using the BBPS [facility] Bowel                         Preparation Scale) with  scores of: Right Colon = 3,                         Transverse Colon = 3 and Left Colon = 3 (entire mucosa                         seen well with no residual staining, small fragments                         of stool or opaque liquid). The total BBPS score                         equals 9. Findings:      The perianal and digital rectal examinations were normal. Pertinent       negatives include normal sphincter tone and no palpable rectal lesions.      The retroflexed view of the distal rectum and anal verge was normal and       showed no anal or rectal abnormalities.      A 4 mm polyp was found in the transverse colon. The polyp was sessile.       The polyp was removed with a cold snare. Resection and retrieval were       complete.      The exam was otherwise without abnormality. Impression:            - The distal rectum and anal verge are normal on                         retroflexion view.                        - One 4 mm polyp in the transverse colon, removed with  a cold snare. Resected and retrieved.                        - The examination was otherwise normal. Recommendation:        - Discharge patient to home (with escort).                        - Resume previous diet today.                        - Continue present medications.                        - Await pathology results.                        - Repeat colonoscopy in 5 years for surveillance. Procedure Code(s):     --- Professional ---                        [phone], Colonoscopy, flexible; with removal of                         tumor(s), polyp(s), or other lesion(s) by snare                         technique Diagnosis Code(s):     --- Professional ---                        K63.5, Polyp of colon                        Z80.0, Family history of malignant neoplasm of                         digestive organs CPT copyright 2019 American Medical Association. All rights reserved. The codes documented  in this report are preliminary and upon coder review may  be revised to meet current compliance requirements. Dr. Ulyess Mort Lin Landsman MD, MD 07/16/2020 9:43:01 AM This report has been signed electronically. Number of Addenda: 0 Note Initiated On: 07/16/2020 9:24 AM Scope Withdrawal Time: 0 hours 7 minutes 54 seconds  Total Procedure Duration: 0 hours 9 minutes 47 seconds  Estimated Blood Loss:  Estimated blood loss: none.      [facility]

## 2020-07-16 NOTE — H&P (Signed)
Holly Darby, MD [address]  [place]  [place], [place] [address]  Main: [phone]  Fax: [phone] Pager: [phone]  Primary Care Physician:  Holly Guise, MD Primary Gastroenterologist:  Dr. Cephas Forbes  Pre-Procedure History & Physical: HPI:  Holly Forbes is a 70 y.o. female is here for an colonoscopy.   Past Medical History:  Diagnosis Date  . Abdominal pain   . 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 Back pain    Left sciatica, low back pain  . Cataract   . Diabetes mellitus without complication ([place])   . GERD (gastroesophageal reflux disease)   . Glaucoma   . Hypertension   . Vitamin D deficiency     Past Surgical History:  Procedure Laterality Date  . ABDOMINAL HYSTERECTOMY    . BREAST EXCISIONAL BIOPSY Left 2015   cyst removed  . CATARACT EXTRACTION W/PHACO Right 03/27/2020   Procedure: CATARACT EXTRACTION PHACO AND INTRAOCULAR LENS PLACEMENT (IOC) RIGHT 5.03 00:50.6 9.9%;  Surgeon: Holly Koyanagi, MD;  Location: [place];  Service: Ophthalmology;  Laterality: Right;  . CATARACT EXTRACTION W/PHACO Left 04/18/2020   Procedure: CATARACT EXTRACTION PHACO AND INTRAOCULAR LENS PLACEMENT (IOC) LEFT DIABETIC 4.56 00:51.7 8.8%;  Surgeon: Holly Koyanagi, MD;  Location: [facility];  Service: Ophthalmology;  Laterality: Left;  . CHOLECYSTECTOMY    . COLONOSCOPY N/A 09/25/2014   Procedure: COLONOSCOPY;  Surgeon: Holly Lame, MD;  Location: [place];  Service: Gastroenterology;  Laterality: N/A;  cecum time- 7867  . CYST REMOVAL NECK    . ESOPHAGOGASTRODUODENOSCOPY N/A 09/25/2014   Procedure: ESOPHAGOGASTRODUODENOSCOPY (EGD);  Surgeon: Holly Lame, MD;  Location: [place];  Service: Gastroenterology;  Laterality: N/A;  . ESOPHAGOGASTRODUODENOSCOPY (EGD) WITH PROPOFOL N/A 04/07/2018   Procedure: ESOPHAGOGASTRODUODENOSCOPY (EGD) WITH PROPOFOL;  Surgeon: Holly Forbes, Holly Pike, MD;  Location: [facility];   Service: Gastroenterology;  Laterality: N/A;  . POLYPECTOMY  09/25/2014   Procedure: POLYPECTOMY INTESTINAL;  Surgeon: Holly Lame, MD;  Location: [place];  Service: Gastroenterology;;    Prior to Admission medications   Medication Sig Start Date End Date Taking? Authorizing Provider  aspirin 81 MG tablet Take 81 mg by mouth daily.    Yes [provider]  canagliflozin (INVOKANA) 300 MG TABS tablet Take 1 tablet (300 mg total) by mouth daily before breakfast. 05/29/20  Yes Holly Guise, MD  Cholecalciferol (DIALYVITE VITAMIN D 5000) 125 MCG (5000 UT) capsule Take 5,000 Units by mouth daily.   Yes [provider]  furosemide (LASIX) 20 MG tablet Take 1 tablet (20 mg total) by mouth daily. 04/30/20  Yes Holly Guise, MD  lisinopril (ZESTRIL) 40 MG tablet Take 1 tablet (40 mg total) by mouth daily. 05/29/20  Yes Holly Guise, MD  pantoprazole (PROTONIX) 20 MG tablet Take 1 tablet (20 mg total) by mouth daily. 07/02/20  Yes Holly Guise, MD  rosuvastatin (CRESTOR) 5 MG tablet Take 1 tablet (5 mg total) by mouth daily. 05/24/20  Yes Holly Forbes, Holly Forbes  vitamin B-12 (CYANOCOBALAMIN) 500 MCG tablet Take 500 mcg by mouth daily.   Yes [provider]  amLODipine (NORVASC) 2.5 MG tablet Take one tab po qhs for blood pressure 04/30/20   Holly Guise, MD  APPLE CIDER VINEGAR PO Take 2 capsules by mouth daily. Patient not taking: Reported on 06/28/2020    [provider]  Blood Glucose Monitoring Suppl (Calvin) w/Device KIT Use  as directed DX E11.65 06/01/20   Holly Guise, MD  dorzolamide-timolol (COSOPT) 22.3-6.8 MG/ML ophthalmic solution Place 1 drop into both eyes 2 (two) times daily.  02/10/18   [provider]  glucose blood (ONETOUCH VERIO) test strip Use as directed once daily DX E 11.65 06/01/20   Holly Guise, MD  ibuprofen (ADVIL) 400 MG tablet Take one tab po bid prn for pain 05/29/20   Holly Guise, MD  latanoprost  (XALATAN) 0.005 % ophthalmic solution Place 1 drop into both eyes at bedtime.  02/10/18   [provider]  OneTouch Delica Lancets 36U MISC Use as directed once a daily DX e11.65 06/01/20   Holly Guise, MD  [facility] OP Place 1 drop into the right eye 2 (two) times daily. Patient not taking: Reported on 06/28/2020    [provider]  sodium chloride (OCEAN) 0.65 % SOLN nasal spray Place 1 spray into both nostrils as needed for congestion.    [provider]    Allergies as of 06/29/2020  . (No Known Allergies)    Family History  Problem Relation Age of Onset  . Heart disease Sister   . Diabetes Sister   . Hypertension Sister   . Heart disease Brother   . Diabetes Brother   . Hypertension Brother   . Breast cancer Daughter 70  . Colon cancer Mother   . Stroke Mother   . Lung cancer Father     Social History   Socioeconomic History  . Marital status: Married    Spouse name: Not on file  . Number of children: Not on file  . Years of education: Not on file  . Highest education level: Not on file  Occupational History  . Not on file  Tobacco Use  . Smoking status: Former Smoker    Packs/day: 0.25    Years: 15.00    Pack years: 3.75    Types: Cigarettes    Quit date: 1995    Years since quitting: 27.1  . Smokeless tobacco: Never Used  Vaping Use  . Vaping Use: Never used  Substance and Sexual Activity  . Alcohol use: Yes    Comment: 3-5 glasses wine/month  . Drug use: Not Currently  . Sexual activity: Not on file  Other Topics Concern  . Not on file  Social History Narrative  . Not on file   Social Determinants of Health   Financial Resource Strain: Not on file  Food Insecurity: Not on file  Transportation Needs: Not on file  Physical Activity: Not on file  Stress: Not on file  Social Connections: Not on file  Intimate Partner Violence: Not on file    Review of Systems: See HPI, otherwise negative  ROS  Physical Exam: BP 139/77   Pulse 83   Temp 97.7 F (36.5 C) (Temporal)   Resp 16   Ht $R'5\' 7"'tX$  (1.702 m)   Wt 98.4 kg   LMP  (LMP Unknown)   SpO2 100%   BMI 33.99 kg/m  General:   Alert,  pleasant and cooperative in NAD Head:  Normocephalic and atraumatic. Neck:  Supple; no masses or thyromegaly. Lungs:  Clear throughout to auscultation.    Heart:  Regular rate and rhythm. Abdomen:  Soft, nontender and nondistended. Normal bowel sounds, without guarding, and without rebound.   Neurologic:  Alert and  oriented x4;  grossly normal neurologically.  Impression/Plan: Holly Forbes is here for an colonoscopy to be performed for  family h/o colon cancer  Risks, benefits, limitations, and alternatives regarding  colonoscopy have been reviewed with the patient.  Questions have been answered.  All parties agreeable.   Sherri Sear, MD  07/16/2020, 9:08 AM

## 2020-07-16 NOTE — Anesthesia Procedure Notes (Signed)
Procedure Name: MAC Date/Time: 07/16/2020 9:26 AM Performed by: Jerrye Noble, CRNA Pre-anesthesia Checklist: Patient identified, Emergency Drugs available, Suction available and Patient being monitored Patient Re-evaluated:Patient Re-evaluated prior to induction Oxygen Delivery Method: Nasal cannula

## 2020-07-16 NOTE — Transfer of Care (Signed)
Immediate Anesthesia Transfer of Care Note  Patient: Holly Forbes  Procedure(s) Performed: COLONOSCOPY WITH PROPOFOL (N/A )  Patient Location: PACU and Endoscopy Unit  Anesthesia Type:General  Level of Consciousness: awake, drowsy and patient cooperative  Airway & Oxygen Therapy: Patient Spontanous Breathing  Post-op Assessment: Report given to RN and Post -op Vital signs reviewed and stable  Post vital signs: Reviewed and stable  Last Vitals:  Vitals Value Taken Time  BP 128/57 07/16/20 0944  Temp    Pulse 102 07/16/20 0944  Resp 21 07/16/20 0944  SpO2 95 % 07/16/20 0944    Last Pain:  Vitals:   07/16/20 0944  TempSrc:   PainSc: 0-No pain         Complications: No complications documented.

## 2020-07-17 LAB — SURGICAL PATHOLOGY

## 2020-07-18 ENCOUNTER — Encounter: Payer: Self-pay | Admitting: Gastroenterology

## 2020-07-19 ENCOUNTER — Encounter: Payer: Self-pay | Admitting: Gastroenterology

## 2020-07-20 DIAGNOSIS — Z1231 Encounter for screening mammogram for malignant neoplasm of breast: Secondary | ICD-10-CM | POA: Diagnosis not present

## 2020-07-23 ENCOUNTER — Other Ambulatory Visit: Payer: Self-pay | Admitting: Internal Medicine

## 2020-08-15 ENCOUNTER — Telehealth: Payer: Self-pay

## 2020-08-16 NOTE — Telephone Encounter (Signed)
Completed medical records for [facility]  Mailed to [address] [id number] Faxed payment request to [phone] for $33.25

## 2020-08-21 ENCOUNTER — Ambulatory Visit (INDEPENDENT_AMBULATORY_CARE_PROVIDER_SITE_OTHER): Payer: PPO | Admitting: Physician Assistant

## 2020-08-21 ENCOUNTER — Other Ambulatory Visit: Payer: Self-pay

## 2020-08-21 ENCOUNTER — Encounter: Payer: Self-pay | Admitting: Internal Medicine

## 2020-08-21 DIAGNOSIS — E1165 Type 2 diabetes mellitus with hyperglycemia: Secondary | ICD-10-CM

## 2020-08-21 DIAGNOSIS — G4733 Obstructive sleep apnea (adult) (pediatric): Secondary | ICD-10-CM | POA: Diagnosis not present

## 2020-08-21 DIAGNOSIS — K219 Gastro-esophageal reflux disease without esophagitis: Secondary | ICD-10-CM | POA: Diagnosis not present

## 2020-08-21 DIAGNOSIS — I10 Essential (primary) hypertension: Secondary | ICD-10-CM

## 2020-08-21 DIAGNOSIS — E669 Obesity, unspecified: Secondary | ICD-10-CM | POA: Diagnosis not present

## 2020-08-21 LAB — POCT GLYCOSYLATED HEMOGLOBIN (HGB A1C): Hemoglobin A1C: 6.7 % — AB (ref 4.0–5.6)

## 2020-08-21 NOTE — Progress Notes (Signed)
[facility] [place], [place] [address]  Internal MEDICINE  Office Visit Note  Patient Name: Holly Forbes  [id number]  [id number]  Date of Service: 08/22/2020  Chief Complaint  Patient presents with  . Follow-up  . Diabetes  . Gastroesophageal Reflux  . Hypertension  . Quality Metric Gaps    Eye exam scheduled     HPI Pt is here for f/u -BG at home 120s fasting usually unless she eats sweets the night before. -Invokana well tolerated and is losing weight. Lost another 5 pounds since last visit in feb.  -Exercising by walking and electric bike that she does manually unless up steep hill. This has dropped lately due to weather, but tries to do 5 miles when she rides. [place] for rides at least 2d/week with walking/housework on other days. -BP at home has been around 120s/80s lately. -Still waiting on APAP due to back order. -Colonoscopy and mammogram done. Had to redo left breast but it came back clear -Eye exam scheduled -takes pantoprazole to help with reflux  Current Medication: Outpatient Encounter Medications as of 08/21/2020  Medication Sig  . amLODipine (NORVASC) 2.5 MG tablet Take one tab po qhs for blood pressure  . aspirin 81 MG tablet Take 81 mg by mouth daily.   . Blood Glucose Monitoring Suppl (ONETOUCH VERIO FLEX SYSTEM) w/Device KIT Use as directed DX E11.65  . canagliflozin (INVOKANA) 300 MG TABS tablet Take 1 tablet (300 mg total) by mouth daily before breakfast.  . Cholecalciferol (DIALYVITE VITAMIN D 5000) 125 MCG (5000 UT) capsule Take 5,000 Units by mouth daily.  . dorzolamide-timolol (COSOPT) 22.3-6.8 MG/ML ophthalmic solution Place 1 drop into both eyes 2 (two) times daily.   . furosemide (LASIX) 20 MG tablet TAKE 1 TABLET BY MOUTH DAILY  . glucose blood (ONETOUCH VERIO) test strip Use as directed once daily DX E 11.65  . ibuprofen (ADVIL) 400 MG tablet Take one tab po bid prn for pain  . latanoprost (XALATAN) 0.005 %  ophthalmic solution Place 1 drop into both eyes at bedtime.   Marland Kitchen lisinopril (ZESTRIL) 40 MG tablet Take 1 tablet (40 mg total) by mouth daily.  Glory Rosebush Delica Lancets 37T MISC Use as directed once a daily DX e11.65  . pantoprazole (PROTONIX) 20 MG tablet Take 1 tablet (20 mg total) by mouth daily.  . rosuvastatin (CRESTOR) 5 MG tablet Take 1 tablet (5 mg total) by mouth daily.  . sodium chloride (OCEAN) 0.65 % SOLN nasal spray Place 1 spray into both nostrils as needed for congestion.  . vitamin B-12 (CYANOCOBALAMIN) 500 MCG tablet Take 500 mcg by mouth daily.  . [DISCONTINUED] APPLE CIDER VINEGAR PO Take 2 capsules by mouth daily. (Patient not taking: No sig reported)  . [DISCONTINUED] PREDNISOLON-MOXIFLOX-BROMFENAC OP Place 1 drop into the right eye 2 (two) times daily. (Patient not taking: Reported on 06/28/2020)   Facility-Administered Encounter Medications as of 08/21/2020  Medication  . triamcinolone acetonide (KENALOG) 10 MG/ML injection 10 mg    Surgical History: Past Surgical History:  Procedure Laterality Date  . ABDOMINAL HYSTERECTOMY    . BREAST EXCISIONAL BIOPSY Left 2015   cyst removed  . CATARACT EXTRACTION W/PHACO Right 03/27/2020   Procedure: CATARACT EXTRACTION PHACO AND INTRAOCULAR LENS PLACEMENT (IOC) RIGHT 5.03 00:50.6 9.9%;  Surgeon: Leandrew Koyanagi, MD;  Location: [place];  Service: Ophthalmology;  Laterality: Right;  . CATARACT EXTRACTION W/PHACO Left 04/18/2020   Procedure: CATARACT EXTRACTION PHACO AND INTRAOCULAR LENS PLACEMENT (  IOC) LEFT DIABETIC 4.56 00:51.7 8.8%;  Surgeon: Leandrew Koyanagi, MD;  Location: [facility];  Service: Ophthalmology;  Laterality: Left;  . CHOLECYSTECTOMY    . COLONOSCOPY N/A 09/25/2014   Procedure: COLONOSCOPY;  Surgeon: Lucilla Lame, MD;  Location: [place];  Service: Gastroenterology;  Laterality: N/A;  cecum time- 0947  . COLONOSCOPY WITH PROPOFOL N/A 07/16/2020   Procedure: COLONOSCOPY WITH PROPOFOL;   Surgeon: Lin Landsman, MD;  Location[facility];  Service: Endoscopy;  Laterality: N/A;  . CYST REMOVAL NECK    . ESOPHAGOGASTRODUODENOSCOPY N/A 09/25/2014   Procedure: ESOPHAGOGASTRODUODENOSCOPY (EGD);  Surgeon: Lucilla Lame, MD;  Location: [place];  Service: Gastroenterology;  Laterality: N/A;  . ESOPHAGOGASTRODUODENOSCOPY (EGD) WITH PROPOFOL N/A 04/07/2018   Procedure: ESOPHAGOGASTRODUODENOSCOPY (EGD) WITH PROPOFOL;  Surgeon: Toledo, Benay Pike, MD;  Location: [facility];  Service: Gastroenterology;  Laterality: N/A;  . POLYPECTOMY  09/25/2014   Procedure: POLYPECTOMY INTESTINAL;  Surgeon: Lucilla Lame, MD;  Location: [place];  Service: Gastroenterology;;    Medical History: Past Medical History:  Diagnosis Date  . Abdominal pain   . 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 Back pain    Left sciatica, low back pain  . Cataract   . Diabetes mellitus without complication ([place])   . GERD (gastroesophageal reflux disease)   . Glaucoma   . Hypertension   . Vitamin D deficiency     Family History: Family History  Problem Relation Age of Onset  . Heart disease Sister   . Diabetes Sister   . Hypertension Sister   . Heart disease Brother   . Diabetes Brother   . Hypertension Brother   . Breast cancer Daughter 35  . Colon cancer Mother   . Stroke Mother   . Lung cancer Father     Social History   Socioeconomic History  . Marital status: Married    Spouse name: Not on file  . Number of children: Not on file  . Years of education: Not on file  . Highest education level: Not on file  Occupational History  . Not on file  Tobacco Use  . Smoking status: Former Smoker    Packs/day: 0.25    Years: 15.00    Pack years: 3.75    Types: Cigarettes    Quit date: 1995    Years since quitting: 27.2  . Smokeless tobacco: Never Used  Vaping Use  . Vaping Use: Never used  Substance and Sexual Activity  . Alcohol use: Yes    Comment: 3-5 glasses  wine/month  . Drug use: Not Currently  . Sexual activity: Not on file  Other Topics Concern  . Not on file  Social History Narrative  . Not on file   Social Determinants of Health   Financial Resource Strain: Not on file  Food Insecurity: Not on file  Transportation Needs: Not on file  Physical Activity: Not on file  Stress: Not on file  Social Connections: Not on file  Intimate Partner Violence: Not on file      Review of Systems  Constitutional: Negative for chills, fatigue and unexpected weight change.  HENT: Negative for congestion, postnasal drip, rhinorrhea, sneezing and sore throat.   Eyes: Negative for redness.  Respiratory: Negative for cough, chest tightness and shortness of breath.   Cardiovascular: Negative for chest pain and palpitations.  Gastrointestinal: Negative for abdominal pain, constipation, diarrhea, nausea and vomiting.  Genitourinary: Negative for dysuria and frequency.  Musculoskeletal: Negative  for arthralgias, back pain, joint swelling and neck pain.  Skin: Negative for rash.  Neurological: Negative.  Negative for tremors and numbness.  Hematological: Negative for adenopathy. Does not bruise/bleed easily.  Psychiatric/Behavioral: Negative for behavioral problems (Depression), sleep disturbance and suicidal ideas. The patient is not nervous/anxious.     Vital Signs: BP 124/80   Pulse 69   Temp (!) 97.4 F (36.3 C)   Resp 16   Ht _0  (1.702 m)   Wt 221 lb 12.8 oz (100.6 kg)   LMP  (LMP Unknown)   SpO2 99%   BMI 34.74 kg/m    Physical Exam Vitals and nursing note reviewed.  Constitutional:      General: She is not in acute distress.    Appearance: She is well-developed. She is obese. She is not diaphoretic.  HENT:     Head: Normocephalic and atraumatic.     Mouth/Throat:     Pharynx: No oropharyngeal exudate.  Eyes:     Pupils: Pupils are equal, round, and reactive to light.  Neck:     Thyroid: No thyromegaly.     Vascular: No  JVD.     Trachea: No tracheal deviation.  Cardiovascular:     Rate and Rhythm: Normal rate and regular rhythm.     Heart sounds: Normal heart sounds. No murmur heard. No friction rub. No gallop.   Pulmonary:     Effort: Pulmonary effort is normal. No respiratory distress.     Breath sounds: No wheezing or rales.  Chest:     Chest wall: No tenderness.  Abdominal:     General: Bowel sounds are normal.     Palpations: Abdomen is soft.  Musculoskeletal:        General: Normal range of motion.     Cervical back: Normal range of motion and neck supple.  Lymphadenopathy:     Cervical: No cervical adenopathy.  Skin:    General: Skin is warm and dry.  Neurological:     Mental Status: She is alert and oriented to person, place, and time.     Cranial Nerves: No cranial nerve deficit.  Psychiatric:        Behavior: Behavior normal.        Thought Content: Thought content normal.        Judgment: Judgment normal.        Assessment/Plan: 1. Uncontrolled type 2 diabetes mellitus with hyperglycemia (HCC) - POCT glycosylated hemoglobin (Hb A1C) is 6.7 today. Will continue Invokana and monitoring BG. Continue to improve diet/exercise.  2. Essential hypertension Well controlled. Continue norvasc, lisinopril, and lasix as before.  3. OSA (obstructive sleep apnea) Awaiting APAP still.  4. Gastroesophageal reflux disease without esophagitis Continue pantoprazole.  5. Obesity (BMI 30.0-34.9) Continue Invokana and improving diet/exercise. Pt has been losing weight.  Obesity Counseling: Risk Assessment: An assessment of behavioral risk factors was made today and includes lack of exercise sedentary lifestyle, lack of portion control and poor dietary habits.  Risk Modification Advice: She was counseled on portion control guidelines. Restricting daily caloric intake to 1800. The detrimental long term effects of obesity on her health and ongoing poor compliance was also discussed with the  patient.     General Counseling: sharri loya understanding of the findings of todays visit and agrees with plan of treatment. I have discussed any further diagnostic evaluation that may be needed or ordered today. We also reviewed her medications today. she has been encouraged to call the office with any  questions or concerns that should arise related to todays visit.    Orders Placed This Encounter  Procedures  . POCT glycosylated hemoglobin (Hb A1C)    No orders of the defined types were placed in this encounter.   This patient was seen by Drema Dallas, PA-C in collaboration with Dr. Clayborn Bigness as a part of collaborative care agreement.   Total time spent:30 Minutes Time spent includes review of chart, medications, test results, and follow up plan with the patient.      Dr Lavera Guise Internal medicine

## 2020-08-23 ENCOUNTER — Ambulatory Visit: Payer: PPO | Admitting: Internal Medicine

## 2020-08-26 ENCOUNTER — Other Ambulatory Visit: Payer: Self-pay | Admitting: Nurse Practitioner

## 2020-08-26 DIAGNOSIS — E785 Hyperlipidemia, unspecified: Secondary | ICD-10-CM

## 2020-08-26 DIAGNOSIS — E1169 Type 2 diabetes mellitus with other specified complication: Secondary | ICD-10-CM

## 2020-09-17 ENCOUNTER — Telehealth: Payer: Self-pay | Admitting: Internal Medicine

## 2020-09-17 NOTE — Progress Notes (Signed)
  Chronic Care Management   Outreach Note  09/17/2020 Name: Holly Forbes MRN: [id number] DOB: [birth year]  Referred by: Lavera Guise, MD Reason for referral : No chief complaint on file.   An unsuccessful telephone outreach was attempted today. The patient was referred to the pharmacist for assistance with care management and care coordination.   Follow Up Plan:   Carley Perdue [employer] Scheduler

## 2020-09-18 ENCOUNTER — Telehealth: Payer: Self-pay | Admitting: Internal Medicine

## 2020-09-18 NOTE — Progress Notes (Signed)
  Chronic Care Management   Note  09/18/2020 Name: Holly Forbes MRN: [id number] DOB: April 11, 1951  Holly Forbes is a 70 y.o. year old female who is a primary care patient of Lavera Guise, MD. I reached out to Florence Canner by phone today in response to a referral sent by Holly Forbes's PCP, Lavera Guise, MD.   Holly Forbes was given information about Chronic Care Management services today including:  1. CCM service includes personalized support from designated clinical staff supervised by her physician, including individualized plan of care and coordination with other care providers 2. 24/7 contact phone numbers for assistance for urgent and routine care needs. 3. Service will only be billed when office clinical staff spend 20 minutes or more in a month to coordinate care. 4. Only one practitioner may furnish and bill the service in a calendar month. 5. The patient may stop CCM services at any time (effective at the end of the month) by phone call to the office staff.   Patient agreed to services and verbal consent obtained.   Follow up plan:   Carley Perdue [employer] Scheduler

## 2020-09-24 ENCOUNTER — Other Ambulatory Visit: Payer: Self-pay | Admitting: Nurse Practitioner

## 2020-09-24 DIAGNOSIS — E785 Hyperlipidemia, unspecified: Secondary | ICD-10-CM

## 2020-09-24 DIAGNOSIS — E1169 Type 2 diabetes mellitus with other specified complication: Secondary | ICD-10-CM

## 2020-10-22 ENCOUNTER — Telehealth: Payer: Self-pay | Admitting: Pharmacist

## 2020-10-22 NOTE — Progress Notes (Addendum)
Chronic Care Management Pharmacy Assistant   Name: Holly Forbes  MRN: [id number] DOB: 03/07/1951  Holly Forbes is an 70 y.o. year old female who presents for his initial CCM visit with the clinical pharmacist.  Reason for Encounter: Chart Prep    Conditions to be addressed/monitored: HTN, GERD, Type 2 DM, HLD, Osteoarthritis.   Primary concerns for visit include: HTN, Type 2 DM.  Recent office visits: 08/21/20 Mylinda Latina, PA-C. For follow-up. Per note:Awaiting APAP still. No medication changes.    06/22/20 Luiz Ochoa, NP. For follow-up. No medication changes.  05/29/20 Lavera Guise, MD. For follow-up. STARTED Canagliflozin 300 mg daily. CHANGED Lisinopril to 40 mg daily. Per note: Will start Autopap  04/26/20 Lavera Guise, MD. For type 2 DM. STARTED Furosemide 20 mg daily STOPPED Hydrochlorothiazide. Per note: Continues to have elevated hga1c, samples of Farxiga 5 mg po qd x 3 weeks and 10 mg po qd after that  04/24/20 Lavera Guise, MD. For follow-up. STARTED Amlodipine Besylate 2.5 mg 1 tablet PO QHS. Per note:Start Lasix ( BLE) 20 mg po qd. Stop hctz   Recent consult visits:  06/28/20 Janace Litten, Evangeline Gula, MD. For history of colonic polyps. STARTED Na Sulfate-K Sulfate-Mg Sulf 17.5-3.13-1.6 GM/177ML 1 kit once.   Hospital visits:  07/16/20 [facility] ([facility] Hour) Vanga, Tally Due, MD. For colonoscopy.   Medication History: Canagliflozin 300 mg 90 DS 08/27/20 Lisinopril 40 mg 90 DS 08/15/20 Rosuvastatin 5 mg 30 DS 08/27/20  Medications: Outpatient Encounter Medications as of 10/22/2020  Medication Sig   amLODipine (NORVASC) 2.5 MG tablet Take one tab po qhs for blood pressure   aspirin 81 MG tablet Take 81 mg by mouth daily.    Blood Glucose Monitoring Suppl (ONETOUCH VERIO FLEX SYSTEM) w/Device KIT Use as directed DX E11.65   canagliflozin (INVOKANA) 300 MG TABS tablet Take 1 tablet (300 mg total) by mouth daily before breakfast.    Cholecalciferol (DIALYVITE VITAMIN D 5000) 125 MCG (5000 UT) capsule Take 5,000 Units by mouth daily.   dorzolamide-timolol (COSOPT) 22.3-6.8 MG/ML ophthalmic solution Place 1 drop into both eyes 2 (two) times daily.    furosemide (LASIX) 20 MG tablet TAKE 1 TABLET BY MOUTH DAILY   glucose blood (ONETOUCH VERIO) test strip Use as directed once daily DX E 11.65   ibuprofen (ADVIL) 400 MG tablet Take one tab po bid prn for pain   latanoprost (XALATAN) 0.005 % ophthalmic solution Place 1 drop into both eyes at bedtime.    lisinopril (ZESTRIL) 40 MG tablet Take 1 tablet (40 mg total) by mouth daily.   OneTouch Delica Lancets 46T MISC Use as directed once a daily DX e11.65   pantoprazole (PROTONIX) 20 MG tablet Take 1 tablet (20 mg total) by mouth daily.   rosuvastatin (CRESTOR) 5 MG tablet Take 1 tablet (5 mg total) by mouth daily.   sodium chloride (OCEAN) 0.65 % SOLN nasal spray Place 1 spray into both nostrils as needed for congestion.   vitamin B-12 (CYANOCOBALAMIN) 500 MCG tablet Take 500 mcg by mouth daily.   Facility-Administered Encounter Medications as of 10/22/2020  Medication   triamcinolone acetonide (KENALOG) 10 MG/ML injection 10 mg    Have you seen any other providers since your last visit? Patient stated no.  Any changes in your medications or health? Patient stated no.  Any side effects from any medications? Patient stated no.  Do you have an symptoms or problems not managed by  your medications? Patient stated no.  Any concerns about your health right now? Patient stated no.  Has your provider asked that you check blood pressure, blood sugar, or follow special diet at home? Patient stated she monitors her blood pressure and blood sugar.   Do you get any type of exercise on a regular basis? Patient stated sometimes she rides a bike and walks.  Can you think of a goal you would like to reach for your health? Patient stated to loose weight.  Do you have any problems  getting your medications? Patient stated no.  Is there anything that you would like to discuss during the appointment? Patient stated no.  Please bring medications and supplements to appointment, patient reminded of her face to face on 10/24/20 at 9 am.  [place], [place] Pharmacist Assistant [phone]

## 2020-10-23 NOTE — Progress Notes (Signed)
Chronic Care Management Pharmacy Note  10/24/2020 Name:  Holly Forbes MRN:  [id number] DOB:  [birth year]  Summary: Initial visit with PharmD.  Reviewed medications. Takes all appropriately and is adherent with all meds.  Recent FBG - 113, 125, 109, 119.  She continues to work on lifestyle mods.  Discussed cholesterol and need for updated lipids, she is currently tolerating Crestor well.  Recommendations/Changes made from today's visit: No changes to meds Recommend repeat lipid panel  Plan: F/u 4 months with PharmD Repeat lipid panel at next visit or physical - if LDL still elevated would recommend increase Crestor to 72m daily based on high risk ASCVD score.   Subjective: Holly Panikis an 70y.o. year old female who is a primary patient of KHumphrey Rolls FTimoteo Gaul MD.  The CCM team was consulted for assistance with disease management and care coordination needs.    Engaged with patient face to face for initial visit in response to provider referral for pharmacy case management and/or care coordination services.   Consent to Services:  The patient was given the following information about Chronic Care Management services today, agreed to services, and gave verbal consent: 1. CCM service includes personalized support from designated clinical staff supervised by the primary care provider, including individualized plan of care and coordination with other care providers 2. 24/7 contact phone numbers for assistance for urgent and routine care needs. 3. Service will only be billed when office clinical staff spend 20 minutes or more in a month to coordinate care. 4. Only one practitioner may furnish and bill the service in a calendar month. 5.The patient may stop CCM services at any time (effective at the end of the month) by phone call to the office staff. 6. The patient will be responsible for cost sharing (co-pay) of up to 20% of the service fee (after annual deductible is met). Patient  agreed to services and consent obtained.  Patient Care Team: KLavera Guise MD as PCP - General (Internal Medicine) DEdythe Clarity [facility] (Pharmacist)  Recent office visits: 08/21/20 MMylinda Latina PA-C. For follow-up. Per note:Awaiting APAP still. No medication changes.    06/22/20 HLuiz Ochoa NP. For follow-up. No medication changes.  05/29/20 KLavera Guise MD. For follow-up. STARTED Canagliflozin 300 mg daily. CHANGED Lisinopril to 40 mg daily. Per note: Will start Autopap 04/26/20 KLavera Guise MD. For type 2 DM. STARTED Furosemide 20 mg daily STOPPED Hydrochlorothiazide. Per note: Continues to have elevated hga1c, samples of Farxiga 5 mg po qd x 3 weeks and 10 mg po qd after that 04/24/20 KLavera Guise MD. For follow-up. STARTED Amlodipine Besylate 2.5 mg 1 tablet PO QHS. Per note:Start Lasix ( BLE) 20 mg po qd. Stop hctz   Recent consult visits:  06/28/20 GJanace Litten DEvangeline Gula MD. For history of colonic polyps. STARTED Na Sulfate-K Sulfate-Mg Sulf 17.5-3.13-1.6 GM/177ML 1 kit once.   Hospital visits:  07/16/20 [facility] (1 Hour) V[place], RTally Due MD. For colonoscopy.   Medication History: Canagliflozin 300 mg 90 DS 08/27/20 Lisinopril 40 mg 90 DS 08/15/20 Rosuvastatin 5 mg 30 DS 08/27/20 Objective:  Lab Results  Component Value Date   CREATININE 0.80 03/15/2020   BUN 9 10/05/2019   GFRNONAA 74 10/05/2019   GFRAA 85 10/05/2019   NA 139 10/05/2019   K 4.2 10/05/2019   CALCIUM 9.5 10/05/2019   CO2 24 10/05/2019   GLUCOSE 147 (H) 10/05/2019    Lab Results  Component Value Date/Time   HGBA1C 6.7 (A) 08/21/2020 10:40 AM   HGBA1C 7.3 (A) 05/29/2020 09:21 AM   MICROALBUR <30 04/26/2020 11:26 AM    Last diabetic Eye exam: No results found for: HMDIABEYEEXA  Last diabetic Foot exam: No results found for: HMDIABFOOTEX   Lab Results  Component Value Date   CHOL 219 (H) 10/05/2019   HDL 58 10/05/2019   LDLCALC 146 (H)  10/05/2019   TRIG 84 10/05/2019    Hepatic Function Latest Ref Rng & Units 10/05/2019 01/21/2018 12/29/2012  Total Protein 6.0 - 8.5 g/dL 7.1 6.7 7.3  Albumin 3.8 - 4.8 g/dL 4.0 4.1 3.5  AST 0 - 40 IU/L _0 ALT 0 - 32 IU/L _1 Alk Phosphatase 48 - 121 IU/L 107 105 115  Total Bilirubin 0.0 - 1.2 mg/dL 0.5 0.5 0.4    Lab Results  Component Value Date/Time   TSH 1.240 10/05/2019 09:11 AM   TSH 1.760 01/21/2018 09:08 AM   FREET4 1.12 10/05/2019 09:11 AM   FREET4 1.19 01/21/2018 09:08 AM    CBC Latest Ref Rng & Units 10/05/2019 01/21/2018 12/29/2012  WBC 3.4 - 10.8 x10E3/uL 7.1 8.0 7.6  Hemoglobin 11.1 - 15.9 g/dL 12.7 12.5 12.6  Hematocrit 34.0 - 46.6 % 39.8 39.0 37.7  Platelets 150 - 450 x10E3/uL 354 317 276    Lab Results  Component Value Date/Time   VD25OH 68.5 10/05/2019 09:11 AM   VD25OH 52.6 01/21/2018 09:08 AM    Clinical ASCVD: No  The 10-year ASCVD risk score Mikey Bussing DC Jr., et al., 2013) is: 22.9%   Values used to calculate the score:     Age: 70 years     Sex: Female     Is Non-Hispanic African American: Yes     Diabetic: Yes     Tobacco smoker: No     Systolic Blood Pressure: 203 mmHg     Is BP treated: Yes     HDL Cholesterol: 58 mg/dL     Total Cholesterol: 219 mg/dL    Depression screen [facility] 2/9 08/21/2020 05/29/2020 03/23/2020  Decreased Interest 0 0 0  Down, Depressed, Hopeless 0 0 0  PHQ - 2 Score 0 0 0      Social History   Tobacco Use  Smoking Status Former Smoker  . Packs/day: 0.25  . Years: 15.00  . Pack years: 3.75  . Types: Cigarettes  . Quit date: 72  . Years since quitting: 27.4  Smokeless Tobacco Never Used   BP Readings from Last 3 Encounters:  08/21/20 124/80  07/16/20 126/70  06/22/20 124/71   Pulse Readings from Last 3 Encounters:  08/21/20 69  07/16/20 69  06/22/20 91   Wt Readings from Last 3 Encounters:  08/21/20 221 lb 12.8 oz (100.6 kg)  07/16/20 217 lb (98.4 kg)  06/22/20 226 lb (102.5 kg)   BMI Readings  from Last 3 Encounters:  08/21/20 34.74 kg/m  07/16/20 33.99 kg/m  06/22/20 35.40 kg/m    Assessment/Interventions: Review of patient past medical history, allergies, medications, health status, including review of consultants reports, laboratory and other test data, was performed as part of comprehensive evaluation and provision of chronic care management services.   SDOH:  (Social Determinants of Health) assessments and interventions performed: Yes   Financial Resource Strain: Low Risk   . Difficulty of Paying Living Expenses: Not very hard    SDOH Screenings   Alcohol Screen: Low Risk   . Last  Alcohol Screening Score (AUDIT): 2  Depression (PHQ2-9): Low Risk   . PHQ-2 Score: 0  Financial Resource Strain: Low Risk   . Difficulty of Paying Living Expenses: Not very hard  Food Insecurity: Not on file  Housing: Not on file  Physical Activity: Not on file  Social Connections: Not on file  Stress: Not on file  Tobacco Use: Medium Risk  . Smoking Tobacco Use: Former Smoker  . Smokeless Tobacco Use: Never Used  Transportation Needs: Not on file    Standard  No Known Allergies  Medications Reviewed Today    Reviewed by Edythe Clarity, [facility] (Pharmacist) on 10/24/20 at 1011  Med List Status: <None>  Medication Order Taking? Sig Documenting Provider Last Dose Status Informant  amLODipine (NORVASC) 2.5 MG tablet [id number] Yes Take one tab po qhs for blood pressure Lavera Guise, MD Taking Active   aspirin 81 MG tablet [id number] Yes Take 81 mg by mouth daily.  [provider] Taking Active Self  Blood Glucose Monitoring Suppl [facility] VERIO FLEX SYSTEM) w/Device KIT [id number] Yes Use as directed DX E11.65 Lavera Guise, MD Taking Active   canagliflozin [facility]) 300 MG TABS tablet [id number] Yes Take 1 tablet (300 mg total) by mouth daily before breakfast. Lavera Guise, MD Taking Active   Cholecalciferol (DIALYVITE VITAMIN D 5000) 125 MCG (5000 UT) capsule  [id number] Yes Take 5,000 Units by mouth daily. [provider] Taking Active Self  dorzolamide-timolol (COSOPT) 22.3-6.8 MG/ML ophthalmic solution [id number] Yes Place 1 drop into both eyes 2 (two) times daily.  [provider] Taking Active Self  furosemide (LASIX) 20 MG tablet [id number] Yes TAKE 1 TABLET BY MOUTH DAILY Lavera Guise, MD Taking Active   glucose blood [facility] VERIO) test strip [id number] Yes Use as directed once daily DX E 11.65 Lavera Guise, MD Taking Active   ibuprofen (ADVIL) 400 MG tablet [id number] Yes Take one tab po bid prn for pain Lavera Guise, MD Taking Active   latanoprost (XALATAN) 0.005 % ophthalmic solution [id number] Yes Place 1 drop into both eyes at bedtime.  [provider] Taking Active Self  lisinopril (ZESTRIL) 40 MG tablet [id number] Yes Take 1 tablet (40 mg total) by mouth daily. Lavera Guise, MD Taking Active   OneTouch Delica Lancets 37S MISC [id number] Yes Use as directed once a daily DX e11.65 Lavera Guise, MD Taking Active   pantoprazole (PROTONIX) 20 MG tablet [id number] Yes Take 1 tablet (20 mg total) by mouth daily. Lavera Guise, MD Taking Active   rosuvastatin (CRESTOR) 5 MG tablet [id number] Yes Take 1 tablet (5 mg total) by mouth daily. Ronnell Freshwater, NP Taking Active   sodium chloride (OCEAN) 0.65 % SOLN nasal spray [id number] Yes Place 1 spray into both nostrils as needed for congestion. [provider] Taking Active Self  triamcinolone acetonide (KENALOG) 10 MG/ML injection 10 mg [id number]   Landis Martins, DPM  Active   vitamin B-12 (CYANOCOBALAMIN) 500 MCG tablet [id number] Yes Take 500 mcg by mouth daily. [provider] Taking Active Self          Patient Active Problem List   Diagnosis Date Noted  . Family history of colon cancer   . Stenosis of right carotid artery 03/13/2020  . Diastolic dysfunction 59/45/8592  . Hyperlipidemia associated with type 2 diabetes mellitus ([place]) 02/15/2020   . Bilateral groin pain 02/15/2020  . Encounter for screening mammogram for malignant neoplasm of breast 02/15/2020  .  Palpitations 10/11/2019  . Screening for colon cancer 10/11/2019  . Encounter for hepatitis C screening test for low risk patient 07/06/2019  . Type 2 diabetes mellitus without complication, without long-term current use of insulin ([place]) 08/16/2018  . Inflammatory pain of left heel 05/17/2018  . Primary generalized (osteo)arthritis 05/17/2018  . Allergic rhinitis due to pollen 02/15/2018  . Encounter for general adult medical examination with abnormal findings 01/28/2018  . Uncontrolled type 2 diabetes mellitus with hyperglycemia ([place]) 01/28/2018  . Acute non-recurrent sinusitis 01/28/2018  . Ovarian failure 01/28/2018  . Need for vaccination against Streptococcus pneumoniae using pneumococcal conjugate vaccine 13 01/28/2018  . Dysuria 01/28/2018  . Gastroesophageal reflux disease without esophagitis 10/19/2017  . Essential hypertension 10/19/2017    Immunization History  Administered Date(s) Administered  . Fluad Quad(high Dose 65+) 02/04/2019  . Moderna Sars-Covid-2 Vaccination 07/02/2019, 07/30/2019, 04/19/2020  . Pneumococcal Polysaccharide-23 01/29/2020    Conditions to be addressed/monitored:  HTN, GERD, Type II DM, HLD  Care Plan : General Pharmacy (Adult)  Updates made by Edythe Clarity, RPH since 10/24/2020 12:00 AM    Problem: HTN, GERD, Type II DM, HLD   Priority: High  Onset Date: 10/24/2020    Long-Range Goal: Patient-Specific Goal   Start Date: 10/24/2020  Expected End Date: 04/25/2021  This Visit's Progress: On track  Priority: High  Note:   Current Barriers:  . Unable to achieve control of glucose  . Suboptimal therapeutic regimen for cholesterol  Pharmacist Clinical Goal(s):  Marland Kitchen Patient will achieve control of glucose and lipids as evidenced by updated labs . adhere to plan to optimize therapeutic regimen for cholesterol as evidenced by  report of adherence to recommended medication management changes . adhere to prescribed medication regimen as evidenced by fill dates . contact provider office for questions/concerns as evidenced notation of same in electronic health record through collaboration with PharmD and provider.   Interventions: . 1:1 collaboration with Lavera Guise, MD regarding development and update of comprehensive plan of care as evidenced by provider attestation and co-signature . Inter-disciplinary care team collaboration (see longitudinal plan of care) . Comprehensive medication review performed; medication list updated in electronic medical record  Hypertension (BP goal <140/90) -Controlled -Current treatment: . Amlodipine 2.97m daily . Lisinopril 4693mdaily -Medications previously tried: HCTZ  -Current home readings: 120s-130s/70s -Current dietary habits: she has cut back on fried foods, drinks mainly water, cut back on sweets and carbs, -Current exercise habits: she does electric bike or walks 2-3 times per week -Denies hypotensive/hypertensive symptoms -Educated on BP goals and benefits of medications for prevention of heart attack, stroke and kidney damage; Exercise goal of 150 minutes per week; Importance of home blood pressure monitoring; Symptoms of hypotension and importance of maintaining adequate hydration; -Counseled to monitor BP at home at least a few times per week, document, and provide log at future appointments -Recommended to continue current medication  Hyperlipidemia: (LDL goal < 70) -Uncontrolled -Current treatment: . Rosuvastatin 93m72mMedications previously tried: none noted  -Current dietary patterns: see above -Current exercise habits: see above -Educated on Cholesterol goals;  Benefits of statin for ASCVD risk reduction; Importance of limiting foods high in cholesterol; Exercise goal of 150 minutes per week; Reviewed last lipid panel -Recommended to continue current  medication Recommended repeat lipid panel upcoming CPE.  She has been on Crestor and has made lifestyle changes since last lipid panel.  Recommend recheck, if still elevated would recommend increasing to Rosuvastatin 49m94mily based on ASCVD risk  score (22.9% - high risk) and comorbidites.  Diabetes (A1c goal <6.5%) -Not ideally controlled -Current medications: . Invokana 357m daily -Medications previously tried: metformin  -Current home glucose readings . fasting glucose: 100-130s, 113, 125, 109, 119 this week so far . post prandial glucose:  -Denies hypoglycemic/hyperglycemic symptoms -Current meal patterns: cut back on carbs, sweets, fried foods -Current exercise: boking/walking 2-3 times per week -Educated on A1c and blood sugar goals; Complications of diabetes including kidney damage, retinal damage, and cardiovascular disease; Benefits of routine self-monitoring of blood sugar; -Counseled to check feet daily and get yearly eye exams -Recommended to continue current medication Recommended repeat A1c and continue current lifestyle modifications.  GERD (Goal: minimize symptoms) -Controlled -Current treatment  . Pantoprazole 221mdaily -Medications previously tried: none noted -Takes appropriately about 30 minutes before food in the morning -Denies recent symptoms unless she eats something she knows triggers it  -Recommended to continue current medication   Patient Goals/Self-Care Activities . Patient will:  - take medications as prescribed check glucose daily, document, and provide at future appointments check blood pressure a few times per week, document, and provide at future appointments target a minimum of 150 minutes of moderate intensity exercise weekly  Follow Up Plan: The care management team will reach out to the patient again over the next 120 days.         Medication Assistance: None required.  Patient affirms current coverage meets  needs.  Compliance/Adherence/Medication fill history: Care Gaps: Needs Eye Exam Shingles Vaccine  Star-Rating Drugs: Lisinopril 40 mg 90 DS 08/15/20 Rosuvastatin 5 mg 30 DS 08/27/20  [id number] [address]: [phone]: [phone] pill box? Yes Pt endorses 100% compliance  We discussed: Benefits of medication synchronization, packaging and delivery as well as enhanced pharmacist oversight with [employer]. Patient decided to: Continue current medication management strategy  Care Plan and Follow Up Patient Decision:  Patient agrees to Care Plan and Follow-up.  Plan: The care management team will reach out to the patient again over the next 120 days.  ChBeverly MilchPharmD Clinical Pharmacist NoGastroenterology Consultants Of San Antonio Med [phone]

## 2020-10-24 ENCOUNTER — Other Ambulatory Visit: Payer: Self-pay

## 2020-10-24 ENCOUNTER — Ambulatory Visit: Payer: PPO

## 2020-10-24 ENCOUNTER — Ambulatory Visit: Payer: PPO | Admitting: Pharmacist

## 2020-10-24 DIAGNOSIS — E1169 Type 2 diabetes mellitus with other specified complication: Secondary | ICD-10-CM

## 2020-10-24 DIAGNOSIS — I10 Essential (primary) hypertension: Secondary | ICD-10-CM

## 2020-10-24 DIAGNOSIS — E119 Type 2 diabetes mellitus without complications: Secondary | ICD-10-CM

## 2020-10-24 DIAGNOSIS — K219 Gastro-esophageal reflux disease without esophagitis: Secondary | ICD-10-CM

## 2020-10-24 DIAGNOSIS — E785 Hyperlipidemia, unspecified: Secondary | ICD-10-CM

## 2020-10-24 MED ORDER — ROSUVASTATIN CALCIUM 5 MG PO TABS: 5.0000 mg | ORAL_TABLET | Freq: Every day | ORAL | 2 refills | Status: DC

## 2020-10-24 NOTE — Patient Instructions (Addendum)
Visit Information  Goals Addressed            This Visit's Progress   . Monitor and Manage My Blood Sugar-Diabetes Type 2       Timeframe:  Long-Range Goal Priority:  High Start Date:  10/24/20                           Expected End Date:   04/25/21                    Follow Up Date 02/15/21   - check blood sugar at prescribed times - check blood sugar if I feel it is too high or too low - take the blood sugar log to all doctor visits - take the blood sugar meter to all doctor visits    Why is this important?    Checking your blood sugar at home helps to keep it from getting very high or very low.   Writing the results in a diary or log helps the doctor know how to care for you.   Your blood sugar log should have the time, date and the results.   Also, write down the amount of insulin or other medicine that you take.   Other information, like what you ate, exercise done and how you were feeling, will also be helpful.     Notes:       Patient Care Plan: General Pharmacy (Adult)    Problem Identified: HTN, GERD, Type II DM, HLD   Priority: High  Onset Date: 10/24/2020    Long-Range Goal: Patient-Specific Goal   Start Date: 10/24/2020  Expected End Date: 04/25/2021  This Visit's Progress: On track  Priority: High  Note:   Current Barriers:  . Unable to achieve control of glucose  . Suboptimal therapeutic regimen for cholesterol  Pharmacist Clinical Goal(s):  Marland Kitchen Patient will achieve control of glucose and lipids as evidenced by updated labs . adhere to plan to optimize therapeutic regimen for cholesterol as evidenced by report of adherence to recommended medication management changes . adhere to prescribed medication regimen as evidenced by fill dates . contact provider office for questions/concerns as evidenced notation of same in electronic health record through collaboration with PharmD and provider.   Interventions: . 1:1 collaboration with Lavera Guise, MD  regarding development and update of comprehensive plan of care as evidenced by provider attestation and co-signature . Inter-disciplinary care team collaboration (see longitudinal plan of care) . Comprehensive medication review performed; medication list updated in electronic medical record  Hypertension (BP goal <140/90) -Controlled -Current treatment: . Amlodipine 2.5mg  daily . Lisinopril 40mg  daily -Medications previously tried: HCTZ  -Current home readings: 120s-130s/70s -Current dietary habits: she has cut back on fried foods, drinks mainly water, cut back on sweets and carbs, -Current exercise habits: she does electric bike or walks 2-3 times per week -Denies hypotensive/hypertensive symptoms -Educated on BP goals and benefits of medications for prevention of heart attack, stroke and kidney damage; Exercise goal of 150 minutes per week; Importance of home blood pressure monitoring; Symptoms of hypotension and importance of maintaining adequate hydration; -Counseled to monitor BP at home at least a few times per week, document, and provide log at future appointments -Recommended to continue current medication  Hyperlipidemia: (LDL goal < 70) -Uncontrolled -Current treatment: . Rosuvastatin 5mg  -Medications previously tried: none noted  -Current dietary patterns: see above -Current exercise habits: see above -Educated on Cholesterol goals;  Benefits of statin for ASCVD risk reduction; Importance of limiting foods high in cholesterol; Exercise goal of 150 minutes per week; Reviewed last lipid panel -Recommended to continue current medication Recommended repeat lipid panel upcoming CPE.  She has been on Crestor and has made lifestyle changes since last lipid panel.  Recommend recheck, if still elevated would recommend increasing to Rosuvastatin 20mg  daily based on ASCVD risk score (22.9% - high risk) and comorbidites.  Diabetes (A1c goal <6.5%) -Not ideally controlled -Current  medications: . Invokana 300mg  daily -Medications previously tried: metformin  -Current home glucose readings . fasting glucose: 100-130s, 113, 125, 109, 119 this week so far . post prandial glucose:  -Denies hypoglycemic/hyperglycemic symptoms -Current meal patterns: cut back on carbs, sweets, fried foods -Current exercise: boking/walking 2-3 times per week -Educated on A1c and blood sugar goals; Complications of diabetes including kidney damage, retinal damage, and cardiovascular disease; Benefits of routine self-monitoring of blood sugar; -Counseled to check feet daily and get yearly eye exams -Recommended to continue current medication Recommended repeat A1c and continue current lifestyle modifications.  GERD (Goal: minimize symptoms) -Controlled -Current treatment  . Pantoprazole 20mg  daily -Medications previously tried: none noted -Takes appropriately about 30 minutes before food in the morning -Denies recent symptoms unless she eats something she knows triggers it  -Recommended to continue current medication   Patient Goals/Self-Care Activities . Patient will:  - take medications as prescribed check glucose daily, document, and provide at future appointments check blood pressure a few times per week, document, and provide at future appointments target a minimum of 150 minutes of moderate intensity exercise weekly  Follow Up Plan: The care management team will reach out to the patient again over the next 120 days.        Ms. Amenta was given information about Chronic Care Management services today including:  1. CCM service includes personalized support from designated clinical staff supervised by her physician, including individualized plan of care and coordination with other care providers 2. 24/7 contact phone numbers for assistance for urgent and routine care needs. 3. Standard insurance, coinsurance, copays and deductibles apply for chronic care management only  during months in which we provide at least 20 minutes of these services. Most insurances cover these services at 100%, however patients may be responsible for any copay, coinsurance and/or deductible if applicable. This service may help you avoid the need for more expensive face-to-face services. 4. Only one practitioner may furnish and bill the service in a calendar month. 5. The patient may stop CCM services at any time (effective at the end of the month) by phone call to the office staff.  Patient agreed to services and verbal consent obtained.   The patient verbalized understanding of instructions, educational materials, and care plan provided today and agreed to receive a mailed copy of patient instructions, educational materials, and care plan.  Telephone follow up appointment with pharmacy team member scheduled for: 4 months  Edythe Clarity, [facility]  Diabetes Mellitus and Nutrition, Adult When you have diabetes, or diabetes mellitus, it is very important to have healthy eating habits because your blood sugar (glucose) levels are greatly affected by what you eat and drink. Eating healthy foods in the right amounts, at about the same times every day, can help you:  Control your blood glucose.  Lower your risk of heart disease.  Improve your blood pressure.  Reach or maintain a healthy weight. What can affect my meal plan? Every person with diabetes is different,  and each person has different needs for a meal plan. Your health care provider may recommend that you work with a dietitian to make a meal plan that is best for you. Your meal plan may vary depending on factors such as:  The calories you need.  The medicines you take.  Your weight.  Your blood glucose, blood pressure, and cholesterol levels.  Your activity level.  Other health conditions you have, such as heart or kidney disease. How do carbohydrates affect me? Carbohydrates, also called carbs, affect your blood  glucose level more than any other type of food. Eating carbs naturally raises the amount of glucose in your blood. Carb counting is a method for keeping track of how many carbs you eat. Counting carbs is important to keep your blood glucose at a healthy level, especially if you use insulin or take certain oral diabetes medicines. It is important to know how many carbs you can safely have in each meal. This is different for every person. Your dietitian can help you calculate how many carbs you should have at each meal and for each snack. How does alcohol affect me? Alcohol can cause a sudden decrease in blood glucose (hypoglycemia), especially if you use insulin or take certain oral diabetes medicines. Hypoglycemia can be a life-threatening condition. Symptoms of hypoglycemia, such as sleepiness, dizziness, and confusion, are similar to symptoms of having too much alcohol.  Do not drink alcohol if: ? Your health care provider tells you not to drink. ? You are pregnant, may be pregnant, or are planning to become pregnant.  If you drink alcohol: ? Do not drink on an empty stomach. ? Limit how much you use to:  0-1 drink a day for women.  0-2 drinks a day for men. ? Be aware of how much alcohol is in your drink. In the [place] one drink equals one 12 oz bottle of beer (355 mL), one 5 oz glass of wine (148 mL), or one 1 oz glass of hard liquor (44 mL). ? Keep yourself hydrated with water, diet soda, or unsweetened iced tea.  Keep in mind that regular soda, juice, and other mixers may contain a lot of sugar and must be counted as carbs. What are tips for following this plan? Reading food labels  Start by checking the serving size on the "Nutrition Facts" label of packaged foods and drinks. The amount of calories, carbs, fats, and other nutrients listed on the label is based on one serving of the item. Many items contain more than one serving per package.  Check the total grams (g) of carbs in one  serving. You can calculate the number of servings of carbs in one serving by dividing the total carbs by 15. For example, if a food has 30 g of total carbs per serving, it would be equal to 2 servings of carbs.  Check the number of grams (g) of saturated fats and trans fats in one serving. Choose foods that have a low amount or none of these fats.  Check the number of milligrams (mg) of salt (sodium) in one serving. Most people should limit total sodium intake to less than 2,300 mg per day.  Always check the nutrition information of foods labeled as "low-fat" or "nonfat." These foods may be higher in added sugar or refined carbs and should be avoided.  Talk to your dietitian to identify your daily goals for nutrients listed on the label. Shopping  Avoid buying canned, pre-made, or processed foods.  These foods tend to be high in fat, sodium, and added sugar.  Shop around the outside edge of the grocery store. This is where you will most often find fresh fruits and vegetables, bulk grains, fresh meats, and fresh dairy. Cooking  Use low-heat cooking methods, such as baking, instead of high-heat cooking methods like deep frying.  Cook using healthy oils, such as olive, canola, or sunflower oil.  Avoid cooking with butter, cream, or high-fat meats. Meal planning  Eat meals and snacks regularly, preferably at the same times every day. Avoid going long periods of time without eating.  Eat foods that are high in fiber, such as fresh fruits, vegetables, beans, and whole grains. Talk with your dietitian about how many servings of carbs you can eat at each meal.  Eat 4-6 oz (112-168 g) of lean protein each day, such as lean meat, chicken, fish, eggs, or tofu. One ounce (oz) of lean protein is equal to: ? 1 oz (28 g) of meat, chicken, or fish. ? 1 egg. ?  cup (62 g) of tofu.  Eat some foods each day that contain healthy fats, such as avocado, nuts, seeds, and fish.   What foods should I  eat? Fruits Berries. Apples. Oranges. Peaches. Apricots. Plums. Grapes. Mango. Papaya. Pomegranate. Kiwi. Cherries. Vegetables Lettuce. Spinach. Leafy greens, including kale, chard, collard greens, and mustard greens. Beets. Cauliflower. Cabbage. Broccoli. Carrots. Green beans. Tomatoes. Peppers. Onions. Cucumbers. Brussels sprouts. Grains Whole grains, such as whole-wheat or whole-grain bread, crackers, tortillas, cereal, and pasta. Unsweetened oatmeal. Quinoa. Brown or wild rice. Meats and other proteins Seafood. Poultry without skin. Lean cuts of poultry and beef. Tofu. Nuts. Seeds. Dairy Low-fat or fat-free dairy products such as milk, yogurt, and cheese. The items listed above may not be a complete list of foods and beverages you can eat. Contact a dietitian for more information. What foods should I avoid? Fruits Fruits canned with syrup. Vegetables Canned vegetables. Frozen vegetables with butter or cream sauce. Grains Refined white flour and flour products such as bread, pasta, snack foods, and cereals. Avoid all processed foods. Meats and other proteins Fatty cuts of meat. Poultry with skin. Breaded or fried meats. Processed meat. Avoid saturated fats. Dairy Full-fat yogurt, cheese, or milk. Beverages Sweetened drinks, such as soda or iced tea. The items listed above may not be a complete list of foods and beverages you should avoid. Contact a dietitian for more information. Questions to ask a health care provider  Do I need to meet with a diabetes educator?  Do I need to meet with a dietitian?  What number can I call if I have questions?  When are the best times to check my blood glucose? Where to find more information:  American Diabetes Association: diabetes.org  Academy of Nutrition and Dietetics: www.eatright.[employer] of Diabetes and Digestive and Kidney Diseases: [employer]  Association of Diabetes Care and Education Specialists:  www.diabeteseducator.org Summary  It is important to have healthy eating habits because your blood sugar (glucose) levels are greatly affected by what you eat and drink.  A healthy meal plan will help you control your blood glucose and maintain a healthy lifestyle.  Your health care provider may recommend that you work with a dietitian to make a meal plan that is best for you.  Keep in mind that carbohydrates (carbs) and alcohol have immediate effects on your blood glucose levels. It is important to count carbs and to use alcohol carefully. This information is not  intended to replace advice given to you by your health care provider. Make sure you discuss any questions you have with your health care provider. Document Revised: 04/12/2019 Document Reviewed: 04/12/2019 Elsevier Patient Education  2021 [employer].

## 2020-11-15 DIAGNOSIS — I10 Essential (primary) hypertension: Secondary | ICD-10-CM | POA: Diagnosis not present

## 2020-11-15 DIAGNOSIS — E1165 Type 2 diabetes mellitus with hyperglycemia: Secondary | ICD-10-CM | POA: Diagnosis not present

## 2020-11-22 ENCOUNTER — Ambulatory Visit: Payer: PPO | Admitting: Physician Assistant

## 2020-11-29 ENCOUNTER — Ambulatory Visit: Payer: PPO | Admitting: Physician Assistant

## 2020-11-29 ENCOUNTER — Telehealth (INDEPENDENT_AMBULATORY_CARE_PROVIDER_SITE_OTHER): Payer: PPO | Admitting: Internal Medicine

## 2020-11-29 VITALS — BP 144/85 | HR 74 | Temp 97.9°F | Ht 67.0 in | Wt 217.0 lb

## 2020-11-29 DIAGNOSIS — J208 Acute bronchitis due to other specified organisms: Secondary | ICD-10-CM | POA: Diagnosis not present

## 2020-11-29 DIAGNOSIS — U071 COVID-19: Secondary | ICD-10-CM | POA: Diagnosis not present

## 2020-11-29 MED ORDER — NIRMATRELVIR/RITONAVIR (PAXLOVID)TABLET: 3.0000 | ORAL_TABLET | Freq: Two times a day (BID) | ORAL | 0 refills | Status: AC

## 2020-11-29 NOTE — Progress Notes (Signed)
[facility] [place], [place] [address]  Internal MEDICINE  Telephone Visit  Patient Name: Holly Forbes  [id number]  [id number]  Date of Service: 12/03/2020  I connected with the patient at 1104 am  by telephone and verified the patients identity using two identifiers.   I discussed the limitations, risks, security and privacy concerns of performing an evaluation and management service by telephone and the availability of in person appointments. I also discussed with the patient that there may be a patient responsible charge related to the service.  The patient expressed understanding and agrees to proceed.    Chief Complaint  Patient presents with   Telephone Screen    [phone] virtual    Telephone Assessment   Headache   Chills   Cough   Quality Metric Gaps    Eye exam, tdap, shingrix vaccine, hepatitis screening,      HPI  Pt is connected for acute and sick visit Tested positive for COVID  She is vaccinated and boosted Fatigue, headache and cough   Current Medication: Outpatient Encounter Medications as of 11/29/2020  Medication Sig   amLODipine (NORVASC) 2.5 MG tablet Take one tab po qhs for blood pressure   aspirin 81 MG tablet Take 81 mg by mouth daily.    Blood Glucose Monitoring Suppl (ONETOUCH VERIO FLEX SYSTEM) w/Device KIT Use as directed DX E11.65   canagliflozin (INVOKANA) 300 MG TABS tablet Take 1 tablet (300 mg total) by mouth daily before breakfast.   Cholecalciferol (DIALYVITE VITAMIN D 5000) 125 MCG (5000 UT) capsule Take 5,000 Units by mouth daily.   dorzolamide-timolol (COSOPT) 22.3-6.8 MG/ML ophthalmic solution Place 1 drop into both eyes 2 (two) times daily.    DTaP-IPV Lynann Bologna) injection Inject 0.5 mLs into the muscle once.   furosemide (LASIX) 20 MG tablet TAKE 1 TABLET BY MOUTH DAILY   glucose blood (ONETOUCH VERIO) test strip Use as directed once daily DX E 11.65   ibuprofen (ADVIL) 400 MG tablet Take one tab  po bid prn for pain   latanoprost (XALATAN) 0.005 % ophthalmic solution Place 1 drop into both eyes at bedtime.    lisinopril (ZESTRIL) 40 MG tablet Take 1 tablet (40 mg total) by mouth daily.   nirmatrelvir/ritonavir EUA (PAXLOVID) TABS Take 3 tablets by mouth 2 (two) times daily for 5 days. Of both medicine ( total 3 tabs ) at one time 2 x day   OneTouch Delica Lancets 63O MISC Use as directed once a daily DX e11.65   pantoprazole (PROTONIX) 20 MG tablet Take 1 tablet (20 mg total) by mouth daily.   rosuvastatin (CRESTOR) 5 MG tablet Take 1 tablet (5 mg total) by mouth daily.   sodium chloride (OCEAN) 0.65 % SOLN nasal spray Place 1 spray into both nostrils as needed for congestion.   vitamin B-12 (CYANOCOBALAMIN) 500 MCG tablet Take 500 mcg by mouth daily.   Zoster Vaccine Adjuvanted [facility]) injection Inject 0.5 mLs into the muscle once.   Facility-Administered Encounter Medications as of 11/29/2020  Medication   triamcinolone acetonide (KENALOG) 10 MG/ML injection 10 mg    Surgical History: Past Surgical History:  Procedure Laterality Date   ABDOMINAL HYSTERECTOMY     BREAST EXCISIONAL BIOPSY Left 2015   cyst removed   CATARACT EXTRACTION W/PHACO Right 03/27/2020   Procedure: CATARACT EXTRACTION PHACO AND INTRAOCULAR LENS PLACEMENT (IOC) RIGHT 5.03 00:50.6 9.9%;  Surgeon: Leandrew Koyanagi, MD;  Location: [place];  Service: Ophthalmology;  Laterality: Right;  CATARACT EXTRACTION W/PHACO Left 04/18/2020   Procedure: CATARACT EXTRACTION PHACO AND INTRAOCULAR LENS PLACEMENT (IOC) LEFT DIABETIC 4.56 00:51.7 8.8%;  Surgeon: Leandrew Koyanagi, MD;  Location: [facility];  Service: Ophthalmology;  Laterality: Left;   CHOLECYSTECTOMY     COLONOSCOPY N/A 09/25/2014   Procedure: COLONOSCOPY;  Surgeon: Lucilla Lame, MD;  Location: [place];  Service: Gastroenterology;  Laterality: N/A;  cecum time- [id number]   COLONOSCOPY WITH PROPOFOL N/A 07/16/2020   Procedure: COLONOSCOPY  WITH PROPOFOL;  Surgeon: Lin Landsman, MD;  Location[facility];  Service: Endoscopy;  Laterality: N/A;   CYST REMOVAL NECK     ESOPHAGOGASTRODUODENOSCOPY N/A 09/25/2014   Procedure: ESOPHAGOGASTRODUODENOSCOPY (EGD);  Surgeon: Lucilla Lame, MD;  Location: [place];  Service: Gastroenterology;  Laterality: N/A;   ESOPHAGOGASTRODUODENOSCOPY (EGD) WITH PROPOFOL N/A 04/07/2018   Procedure: ESOPHAGOGASTRODUODENOSCOPY (EGD) WITH PROPOFOL;  Surgeon: Toledo, Benay Pike, MD;  Location: [facility];  Service: Gastroenterology;  Laterality: N/A;   POLYPECTOMY  09/25/2014   Procedure: POLYPECTOMY INTESTINAL;  Surgeon: Lucilla Lame, MD;  Location: [place];  Service: Gastroenterology;;    Medical History: 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   Back pain    Left sciatica, low back pain   Cataract    Diabetes mellitus without complication (HCC)    GERD (gastroesophageal reflux disease)    Glaucoma    Hypertension    Vitamin D deficiency     Family History: Family History  Problem Relation Age of Onset   Heart disease Sister    Diabetes Sister    Hypertension Sister    Heart disease Brother    Diabetes Brother    Hypertension Brother    Breast cancer Daughter 68   Colon cancer Mother    Stroke Mother    Lung cancer Father     Social History   Socioeconomic History   Marital status: Married    Spouse name: Not on file   Number of children: Not on file   Years of education: Not on file   Highest education level: Not on file  Occupational History   Not on file  Tobacco Use   Smoking status: Former    Packs/day: 0.25    Years: 15.00    Pack years: 3.75    Types: Cigarettes    Quit date: 1995    Years since quitting: 27.5   Smokeless tobacco: Never  Vaping Use   Vaping Use: Never used  Substance and Sexual Activity   Alcohol use: Yes    Comment: 3-5 glasses wine/month   Drug use: Not  Currently   Sexual activity: Not on file  Other Topics Concern   Not on file  Social History Narrative   Not on file   Social Determinants of Health   Financial Resource Strain: Low Risk    Difficulty of Paying Living Expenses: Not very hard  Food Insecurity: Not on file  Transportation Needs: Not on file  Physical Activity: Not on file  Stress: Not on file  Social Connections: Not on file  Intimate Partner Violence: Not on file      Review of Systems  Constitutional:  Negative for fatigue and fever.  HENT:  Negative for congestion, mouth sores and postnasal drip.   Respiratory:  Positive for cough.   Cardiovascular:  Negative for chest pain.  Genitourinary:  Negative for flank pain.  Neurological:  Positive for  headaches.  Psychiatric/Behavioral: Negative.     Vital Signs: BP (!) 144/85   Pulse 74   Temp 97.9 F (36.6 C)   Ht $R'5\' 7"'Vw$  (1.702 m)   Wt 217 lb (98.4 kg)   LMP  (LMP Unknown)   BMI 33.99 kg/m    Observation/Objective:  NAD   Assessment/Plan: 1. Acute bronchitis due to COVID-19 virus She meets the criteria for Paxlovid, will start therapy  Instructed to rest, activity ad lib and increase fluids  - nirmatrelvir/ritonavir EUA (PAXLOVID) TABS; Take 3 tablets by mouth 2 (two) times daily for 5 days. Of both medicine ( total 3 tabs ) at one time 2 x day  Dispense: 30 tablet; Refill: 0   General Counseling: Amybeth verbalizes understanding of the findings of today's phone visit and agrees with plan of treatment. I have discussed any further diagnostic evaluation that may be needed or ordered today. We also reviewed her medications today. she has been encouraged to call the office with any questions or concerns that should arise related to todays visit.    No orders of the defined types were placed in this encounter.   Meds ordered this encounter  Medications   nirmatrelvir/ritonavir EUA (PAXLOVID) TABS    Sig: Take 3 tablets by mouth 2 (two) times daily  for 5 days. Of both medicine ( total 3 tabs ) at one time 2 x day    Dispense:  30 tablet    Refill:  0    Time spent:10 Minutes    Dr Lavera Guise Internal medicine

## 2020-11-30 ENCOUNTER — Ambulatory Visit: Payer: PPO | Admitting: Physician Assistant

## 2020-12-05 ENCOUNTER — Telehealth: Payer: Self-pay | Admitting: Pharmacist

## 2020-12-05 NOTE — Progress Notes (Addendum)
Chronic Care Management Pharmacy Assistant   Name: Holly Forbes  MRN: [id number] DOB: 1950/12/21  Reason for Encounter: Disease State For DM.   Conditions to be addressed/monitored: HTN, GERD, Type II DM, HLD  Recent office visits:  11/29/20 Dr. Humphrey Rolls (Video Visit) For acute bronchitis due to COVID-19. STARTED Nirmatrelvir-Ritonavir 3 tablets Oral 2 times daily, Of both medicine ( total 3 tabs ) at one time 2 x day.  Recent consult visits:  None since 10/24/20  Hospital visits:  None since 10/24/20  Medications: Outpatient Encounter Medications as of 12/05/2020  Medication Sig   amLODipine (NORVASC) 2.5 MG tablet Take one tab po qhs for blood pressure   aspirin 81 MG tablet Take 81 mg by mouth daily.    Blood Glucose Monitoring Suppl (ONETOUCH VERIO FLEX SYSTEM) w/Device KIT Use as directed DX E11.65   canagliflozin (INVOKANA) 300 MG TABS tablet Take 1 tablet (300 mg total) by mouth daily before breakfast.   Cholecalciferol (DIALYVITE VITAMIN D 5000) 125 MCG (5000 UT) capsule Take 5,000 Units by mouth daily.   dorzolamide-timolol (COSOPT) 22.3-6.8 MG/ML ophthalmic solution Place 1 drop into both eyes 2 (two) times daily.    DTaP-IPV Lynann Bologna) injection Inject 0.5 mLs into the muscle once.   furosemide (LASIX) 20 MG tablet TAKE 1 TABLET BY MOUTH DAILY   glucose blood (ONETOUCH VERIO) test strip Use as directed once daily DX E 11.65   ibuprofen (ADVIL) 400 MG tablet Take one tab po bid prn for pain   latanoprost (XALATAN) 0.005 % ophthalmic solution Place 1 drop into both eyes at bedtime.    lisinopril (ZESTRIL) 40 MG tablet Take 1 tablet (40 mg total) by mouth daily.   OneTouch Delica Lancets 79U MISC Use as directed once a daily DX e11.65   pantoprazole (PROTONIX) 20 MG tablet Take 1 tablet (20 mg total) by mouth daily.   rosuvastatin (CRESTOR) 5 MG tablet Take 1 tablet (5 mg total) by mouth daily.   sodium chloride (OCEAN) 0.65 % SOLN nasal spray Place 1 spray into both  nostrils as needed for congestion.   vitamin B-12 (CYANOCOBALAMIN) 500 MCG tablet Take 500 mcg by mouth daily.   Zoster Vaccine Adjuvanted [facility]) injection Inject 0.5 mLs into the muscle once.   Facility-Administered Encounter Medications as of 12/05/2020  Medication   triamcinolone acetonide (KENALOG) 10 MG/ML injection 10 mg    Recent Relevant Labs: Lab Results  Component Value Date/Time   HGBA1C 6.7 (A) 08/21/2020 10:40 AM   HGBA1C 7.3 (A) 05/29/2020 09:21 AM   MICROALBUR <30 04/26/2020 11:26 AM    Kidney Function Lab Results  Component Value Date/Time   CREATININE 0.80 03/15/2020 04:01 PM   CREATININE 0.83 10/05/2019 09:11 AM   CREATININE 0.67 12/29/2012 05:48 PM   GFRNONAA 74 10/05/2019 09:11 AM   GFRNONAA >60 12/29/2012 05:48 PM   GFRAA 85 10/05/2019 09:11 AM   GFRAA >60 12/29/2012 05:48 PM    Current antihyperglycemic regimen:  Invokana $RemoveB'300mg'[id number]$  daily  What recent interventions/DTPs have been made to improve glycemic control:  None.  Have there been any recent hospitalizations or ED visits since last visit with CPP? Patient stated no.  Patient denies hypoglycemic symptoms, including None  Patient denies hyperglycemic symptoms, including none  How often are you checking your blood sugar?  Patient stated once daily  What are your blood sugars ranging?  Patient stated her blood sugar ranges around 100-120.  During the week, how often does your blood glucose drop below  70? Patient stated Never  Are you checking your feet daily/regularly?  Patient stated she checks her feet regularly.   Adherence Review: Is the patient currently on a STATIN medication? Rosuvastatin 5 mg   Is the patient currently on ACE/ARB medication? Lisinopril 40 mg  Does the patient have >5 day gap between last estimated fill dates? Per misc rpts, no.   Patient has not gotten her blood work done. Patient stated she has a upcoming appointment in August and blood work may be done at that  time.  Star Rating Drugs: Rosuvastatin 5 mg 30 DS 11/14/20, Lisinopril 40 mg 90 DS 11/11/20.  Follow-Up:Pharmacist Review  Charlann Lange, RMA Clinical Pharmacist Assistant [phone]  10 minutes spent in review, coordination, and documentation.  Reviewed by: Beverly Milch, PharmD Clinical Pharmacist [phone]

## 2020-12-16 DIAGNOSIS — E1165 Type 2 diabetes mellitus with hyperglycemia: Secondary | ICD-10-CM | POA: Diagnosis not present

## 2020-12-16 DIAGNOSIS — I10 Essential (primary) hypertension: Secondary | ICD-10-CM | POA: Diagnosis not present

## 2020-12-20 ENCOUNTER — Ambulatory Visit (INDEPENDENT_AMBULATORY_CARE_PROVIDER_SITE_OTHER): Payer: PPO | Admitting: Physician Assistant

## 2020-12-20 ENCOUNTER — Encounter: Payer: Self-pay | Admitting: Physician Assistant

## 2020-12-20 ENCOUNTER — Other Ambulatory Visit: Payer: Self-pay

## 2020-12-20 DIAGNOSIS — E119 Type 2 diabetes mellitus without complications: Secondary | ICD-10-CM | POA: Diagnosis not present

## 2020-12-20 DIAGNOSIS — I10 Essential (primary) hypertension: Secondary | ICD-10-CM | POA: Diagnosis not present

## 2020-12-20 DIAGNOSIS — E669 Obesity, unspecified: Secondary | ICD-10-CM

## 2020-12-20 DIAGNOSIS — G4733 Obstructive sleep apnea (adult) (pediatric): Secondary | ICD-10-CM

## 2020-12-20 LAB — POCT GLYCOSYLATED HEMOGLOBIN (HGB A1C): Hemoglobin A1C: 6.8 % — AB (ref 4.0–5.6)

## 2020-12-20 NOTE — Progress Notes (Signed)
[facility] [place], [place] [address]  Internal MEDICINE  Office Visit Note  Patient Name: Holly Forbes  [id number]  [id number]  Date of Service: 12/23/2020  Chief Complaint  Patient presents with   Follow-up   Diabetes   Gastroesophageal Reflux   Hypertension    HPI Pt is here for routine follow up -Still waiting to hear about APAP -BG at home fasting around 120s.  -BP at home 130-140/70s -Has lost another 4 lbs since last visit -Occasionally feels winded/with a cough since having covid a few weeks ago. Doesn't happen all the time and overall feels better.  -Sleeping through the night again  Current Medication: Outpatient Encounter Medications as of 12/20/2020  Medication Sig   amLODipine (NORVASC) 2.5 MG tablet Take one tab po qhs for blood pressure   aspirin 81 MG tablet Take 81 mg by mouth daily.    Blood Glucose Monitoring Suppl (ONETOUCH VERIO FLEX SYSTEM) w/Device KIT Use as directed DX E11.65   canagliflozin (INVOKANA) 300 MG TABS tablet Take 1 tablet (300 mg total) by mouth daily before breakfast.   Cholecalciferol (DIALYVITE VITAMIN D 5000) 125 MCG (5000 UT) capsule Take 5,000 Units by mouth daily.   dorzolamide-timolol (COSOPT) 22.3-6.8 MG/ML ophthalmic solution Place 1 drop into both eyes 2 (two) times daily.    DTaP-IPV Lynann Bologna) injection Inject 0.5 mLs into the muscle once.   furosemide (LASIX) 20 MG tablet TAKE 1 TABLET BY MOUTH DAILY   glucose blood (ONETOUCH VERIO) test strip Use as directed once daily DX E 11.65   ibuprofen (ADVIL) 400 MG tablet Take one tab po bid prn for pain   latanoprost (XALATAN) 0.005 % ophthalmic solution Place 1 drop into both eyes at bedtime.    lisinopril (ZESTRIL) 40 MG tablet Take 1 tablet (40 mg total) by mouth daily.   OneTouch Delica Lancets 02I MISC Use as directed once a daily DX e11.65   pantoprazole (PROTONIX) 20 MG tablet Take 1 tablet (20 mg total) by mouth daily.   rosuvastatin  (CRESTOR) 5 MG tablet Take 1 tablet (5 mg total) by mouth daily.   sodium chloride (OCEAN) 0.65 % SOLN nasal spray Place 1 spray into both nostrils as needed for congestion.   vitamin B-12 (CYANOCOBALAMIN) 500 MCG tablet Take 500 mcg by mouth daily.   Zoster Vaccine Adjuvanted [facility]) injection Inject 0.5 mLs into the muscle once.   Facility-Administered Encounter Medications as of 12/20/2020  Medication   triamcinolone acetonide (KENALOG) 10 MG/ML injection 10 mg    Surgical History: Past Surgical History:  Procedure Laterality Date   ABDOMINAL HYSTERECTOMY     BREAST EXCISIONAL BIOPSY Left 2015   cyst removed   CATARACT EXTRACTION W/PHACO Right 03/27/2020   Procedure: CATARACT EXTRACTION PHACO AND INTRAOCULAR LENS PLACEMENT (IOC) RIGHT 5.03 00:50.6 9.9%;  Surgeon: Leandrew Koyanagi, MD;  Location: [place];  Service: Ophthalmology;  Laterality: Right;   CATARACT EXTRACTION W/PHACO Left 04/18/2020   Procedure: CATARACT EXTRACTION PHACO AND INTRAOCULAR LENS PLACEMENT (IOC) LEFT DIABETIC 4.56 00:51.7 8.8%;  Surgeon: Leandrew Koyanagi, MD;  Location: [facility];  Service: Ophthalmology;  Laterality: Left;   CHOLECYSTECTOMY     COLONOSCOPY N/A 09/25/2014   Procedure: COLONOSCOPY;  Surgeon: Lucilla Lame, MD;  Location: [place];  Service: Gastroenterology;  Laterality: N/A;  cecum time- 0973   COLONOSCOPY WITH PROPOFOL N/A 07/16/2020   Procedure: COLONOSCOPY WITH PROPOFOL;  Surgeon: Lin Landsman, MD;  Location: [facility];  Service: Endoscopy;  Laterality:  N/A;   CYST REMOVAL NECK     ESOPHAGOGASTRODUODENOSCOPY N/A 09/25/2014   Procedure: ESOPHAGOGASTRODUODENOSCOPY (EGD);  Surgeon: Midge Minium, MD;  Location: [facility];  Service: Gastroenterology;  Laterality: N/A;   ESOPHAGOGASTRODUODENOSCOPY (EGD) WITH PROPOFOL N/A 04/07/2018   Procedure: ESOPHAGOGASTRODUODENOSCOPY (EGD) WITH PROPOFOL;  Surgeon: Toledo, Boykin Nearing, MD;  Location: [facility];   Service: Gastroenterology;  Laterality: N/A;   POLYPECTOMY  09/25/2014   Procedure: POLYPECTOMY INTESTINAL;  Surgeon: Midge Minium, MD;  Location: [facility];  Service: Gastroenterology;;    Medical History: 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   Back pain    Left sciatica, low back pain   Cataract    Diabetes mellitus without complication (HCC)    GERD (gastroesophageal reflux disease)    Glaucoma    Hypertension    Vitamin D deficiency     Family History: Family History  Problem Relation Age of Onset   Heart disease Sister    Diabetes Sister    Hypertension Sister    Heart disease Brother    Diabetes Brother    Hypertension Brother    Breast cancer Daughter 28   Colon cancer Mother    Stroke Mother    Lung cancer Father     Social History   Socioeconomic History   Marital status: Married    Spouse name: Not on file   Number of children: Not on file   Years of education: Not on file   Highest education level: Not on file  Occupational History   Not on file  Tobacco Use   Smoking status: Former    Packs/day: 0.25    Years: 15.00    Pack years: 3.75    Types: Cigarettes    Quit date: 1995    Years since quitting: 27.6   Smokeless tobacco: Never  Vaping Use   Vaping Use: Never used  Substance and Sexual Activity   Alcohol use: Yes    Comment: 3-5 glasses wine/month   Drug use: Not Currently   Sexual activity: Not on file  Other Topics Concern   Not on file  Social History Narrative   Not on file   Social Determinants of Health   Financial Resource Strain: Low Risk    Difficulty of Paying Living Expenses: Not very hard  Food Insecurity: Not on file  Transportation Needs: Not on file  Physical Activity: Not on file  Stress: Not on file  Social Connections: Not on file  Intimate Partner Violence: Not on file      Review of Systems  Constitutional:  Negative for chills,  fatigue and unexpected weight change.  HENT:  Negative for congestion, postnasal drip, rhinorrhea, sneezing and sore throat.   Eyes:  Negative for redness.  Respiratory:  Positive for cough. Negative for chest tightness, shortness of breath and wheezing.   Cardiovascular:  Negative for chest pain and palpitations.  Gastrointestinal:  Negative for abdominal pain, constipation, diarrhea, nausea and vomiting.  Genitourinary:  Negative for dysuria and frequency.  Musculoskeletal:  Negative for arthralgias, back pain, joint swelling and neck pain.  Skin:  Negative for rash.  Neurological: Negative.  Negative for tremors and numbness.  Hematological:  Negative for adenopathy. Does not bruise/bleed easily.  Psychiatric/Behavioral:  Negative for behavioral problems (Depression), sleep disturbance and suicidal ideas. The patient is not nervous/anxious.    Vital Signs: BP 128/76   Pulse 73  Temp (!) 97.5 F (36.4 C)   Resp 16   Ht $R'5\' 7"'gz$  (1.702 m)   Wt 217 lb (98.4 kg)   LMP  (LMP Unknown)   SpO2 97%   BMI 33.99 kg/m    Physical Exam Vitals and nursing note reviewed.  Constitutional:      General: She is not in acute distress.    Appearance: She is well-developed. She is obese. She is not diaphoretic.  HENT:     Head: Normocephalic and atraumatic.     Mouth/Throat:     Pharynx: No oropharyngeal exudate.  Eyes:     Pupils: Pupils are equal, round, and reactive to light.  Neck:     Thyroid: No thyromegaly.     Vascular: No JVD.     Trachea: No tracheal deviation.  Cardiovascular:     Rate and Rhythm: Normal rate and regular rhythm.     Heart sounds: Normal heart sounds. No murmur heard.   No friction rub. No gallop.  Pulmonary:     Effort: Pulmonary effort is normal. No respiratory distress.     Breath sounds: No wheezing or rales.  Chest:     Chest wall: No tenderness.  Abdominal:     General: Bowel sounds are normal.     Palpations: Abdomen is soft.  Musculoskeletal:         General: Normal range of motion.     Cervical back: Normal range of motion and neck supple.  Lymphadenopathy:     Cervical: No cervical adenopathy.  Skin:    General: Skin is warm and dry.  Neurological:     Mental Status: She is alert and oriented to person, place, and time.     Cranial Nerves: No cranial nerve deficit.  Psychiatric:        Behavior: Behavior normal.        Thought Content: Thought content normal.        Judgment: Judgment normal.       Assessment/Plan: 1. Type 2 diabetes mellitus without complication, without long-term current use of insulin (HCC) - POCT HgB A1C is 6.8, which is mildly elevated from last visit at 6.7. Will continue invokana and improving diet and exercise  2. Essential hypertension Stable, continue current medications  3. OSA (obstructive sleep apnea) Waiting for APAP--back ordered  4. Obesity (BMI 30.0-34.9) Lost another 4 lbs since last visit Obesity Counseling: Had a lengthy discussion regarding patients BMI and weight issues. Patient was instructed on portion control as well as increased activity. Also discussed caloric restrictions with trying to maintain intake less than 2000 Kcal. Discussions were made in accordance with the 5As of weight management. Simple actions such as not eating late and if able to, taking a walk is suggested.    General Counseling: myrene bougher understanding of the findings of todays visit and agrees with plan of treatment. I have discussed any further diagnostic evaluation that may be needed or ordered today. We also reviewed her medications today. she has been encouraged to call the office with any questions or concerns that should arise related to todays visit.    Orders Placed This Encounter  Procedures   POCT HgB A1C    No orders of the defined types were placed in this encounter.   This patient was seen by Drema Dallas, PA-C in collaboration with Dr. Clayborn Bigness as a part of collaborative  care agreement.   Total time spent:35 Minutes Time spent includes review of chart, medications, test results,  and follow up plan with the patient.      Dr Lavera Guise Internal medicine

## 2020-12-27 ENCOUNTER — Other Ambulatory Visit: Payer: Self-pay | Admitting: Internal Medicine

## 2020-12-27 DIAGNOSIS — K219 Gastro-esophageal reflux disease without esophagitis: Secondary | ICD-10-CM

## 2021-01-01 ENCOUNTER — Telehealth: Payer: Self-pay

## 2021-01-01 NOTE — Telephone Encounter (Signed)
Patients order for cpap(auto-titrating) has been sent over to [facility] on 01/01/2021

## 2021-01-03 DIAGNOSIS — H401131 Primary open-angle glaucoma, bilateral, mild stage: Secondary | ICD-10-CM | POA: Diagnosis not present

## 2021-01-10 DIAGNOSIS — H401131 Primary open-angle glaucoma, bilateral, mild stage: Secondary | ICD-10-CM | POA: Diagnosis not present

## 2021-01-10 LAB — HM DIABETES EYE EXAM

## 2021-01-24 ENCOUNTER — Other Ambulatory Visit: Payer: Self-pay | Admitting: Internal Medicine

## 2021-01-24 DIAGNOSIS — E785 Hyperlipidemia, unspecified: Secondary | ICD-10-CM

## 2021-01-24 DIAGNOSIS — E1169 Type 2 diabetes mellitus with other specified complication: Secondary | ICD-10-CM

## 2021-01-28 ENCOUNTER — Other Ambulatory Visit: Payer: Self-pay | Admitting: Nurse Practitioner

## 2021-01-28 ENCOUNTER — Telehealth: Payer: Self-pay | Admitting: Pharmacist

## 2021-01-28 DIAGNOSIS — Z1231 Encounter for screening mammogram for malignant neoplasm of breast: Secondary | ICD-10-CM

## 2021-01-28 NOTE — Progress Notes (Addendum)
Chronic Care Management Pharmacy Assistant   Name: Holly Forbes  MRN: [id number] DOB: [birth year]  Reason for Encounter: Disease State For DM.    Conditions to be addressed/monitored: HTN, GERD, Type II DM, HLD  Recent office visits:  12/20/20 Holly Jews, PA-C. For follow-up. No medication changes.   Recent consult visits:  None since 12/20/20  Forbes visits:  None since 12/20/20  Medications: Outpatient Encounter Medications as of 01/28/2021  Medication Sig   amLODipine (NORVASC) 2.5 MG tablet Take one tab po qhs for blood pressure   aspirin 81 MG tablet Take 81 mg by mouth daily.    Blood Glucose Monitoring Suppl (ONETOUCH VERIO FLEX SYSTEM) w/Device KIT Use as directed DX E11.65   canagliflozin (INVOKANA) 300 MG TABS tablet Take 1 tablet (300 mg total) by mouth daily before breakfast.   Cholecalciferol (DIALYVITE VITAMIN D 5000) 125 MCG (5000 UT) capsule Take 5,000 Units by mouth daily.   dorzolamide-timolol (COSOPT) 22.3-6.8 MG/ML ophthalmic solution Place 1 drop into both eyes 2 (two) times daily.    DTaP-IPV Holly Forbes) injection Inject 0.5 mLs into the muscle once.   furosemide (LASIX) 20 MG tablet TAKE 1 TABLET BY MOUTH DAILY   glucose blood (ONETOUCH VERIO) test strip Use as directed once daily DX E 11.65   ibuprofen (ADVIL) 400 MG tablet Take one tab po bid prn for pain   latanoprost (XALATAN) 0.005 % ophthalmic solution Place 1 drop into both eyes at bedtime.    lisinopril (ZESTRIL) 40 MG tablet Take 1 tablet (40 mg total) by mouth daily.   OneTouch Delica Lancets 30G MISC Use as directed once a daily DX e11.65   pantoprazole (PROTONIX) 20 MG tablet TAKE 1 TABLET BY MOUTH DAILY   rosuvastatin (CRESTOR) 5 MG tablet TAKE 1 TABLET(5 MG) BY MOUTH DAILY   sodium chloride (OCEAN) 0.65 % SOLN nasal spray Place 1 spray into both nostrils as needed for congestion.   vitamin B-12 (CYANOCOBALAMIN) 500 MCG tablet Take 500 mcg by mouth daily.   Zoster Vaccine  Adjuvanted Holly Forbes) injection Inject 0.5 mLs into the muscle once.   Facility-Administered Encounter Medications as of 01/28/2021  Medication   triamcinolone acetonide (KENALOG) 10 MG/ML injection 10 mg   Recent Relevant Labs: Lab Results  Component Value Date/Time   HGBA1C 6.8 (A) 12/20/2020 02:30 PM   HGBA1C 6.7 (A) 08/21/2020 10:40 AM   MICROALBUR <30 04/26/2020 11:26 AM    Kidney Function Lab Results  Component Value Date/Time   CREATININE 0.80 03/15/2020 04:01 PM   CREATININE 0.83 10/05/2019 09:11 AM   CREATININE 0.67 12/29/2012 05:48 PM   GFRNONAA 74 10/05/2019 09:11 AM   GFRNONAA >60 12/29/2012 05:48 PM   GFRAA 85 10/05/2019 09:11 AM   GFRAA >60 12/29/2012 05:48 PM    Current antihyperglycemic regimen:  Invokana 300mg  daily  What recent interventions/DTPs have been made to improve glycemic control:  None.   Have there been any recent hospitalizations or ED visits since last visit with CPP? Patient stated no.   Patient denies hypoglycemic symptoms, including None  Patient denies hyperglycemic symptoms, including none  How often are you checking your blood sugar? Patient stated once daily  What are your blood sugars ranging?  Patient stated her blood sugars are within normal ranges.   During the week, how often does your blood glucose drop below 70? Patient stated Never  Are you checking your feet daily/regularly?  Patient was reminded to check her feet regularly   Adherence Review:  Is the patient currently on a STATIN medication? Rosuvastatin 5 mg   Is the patient currently on ACE/ARB medication? Lisinopril 40 mg  Does the patient have >5 day gap between last estimated fill dates? Per misc rpts, no.   Care Gaps: Patient needs her mammogram complete.   Star Rating Drugs: Rosuvastatin 5 mg 12/18/20 30 DS , Lisinopril 40 mg 11/11/20 90 DS, Canagliflozin 300 mg 11/26/20 90 DS.   Follow-Up:Pharmacist Review  Holly Forbes, RMA Clinical Pharmacist  Assistant [phone]  10 minutes spent in review, coordination, and documentation.  Reviewed by: Holly Forbes, PharmD Clinical Pharmacist [phone]

## 2021-01-31 ENCOUNTER — Other Ambulatory Visit: Payer: Self-pay

## 2021-01-31 ENCOUNTER — Ambulatory Visit (INDEPENDENT_AMBULATORY_CARE_PROVIDER_SITE_OTHER): Payer: PPO | Admitting: Nurse Practitioner

## 2021-01-31 ENCOUNTER — Encounter: Payer: Self-pay | Admitting: Nurse Practitioner

## 2021-01-31 VITALS — BP 132/72 | HR 60 | Temp 98.7°F | Resp 16 | Ht 67.0 in | Wt 214.4 lb

## 2021-01-31 DIAGNOSIS — R300 Dysuria: Secondary | ICD-10-CM

## 2021-01-31 DIAGNOSIS — E119 Type 2 diabetes mellitus without complications: Secondary | ICD-10-CM

## 2021-01-31 DIAGNOSIS — E6609 Other obesity due to excess calories: Secondary | ICD-10-CM

## 2021-01-31 DIAGNOSIS — I10 Essential (primary) hypertension: Secondary | ICD-10-CM

## 2021-01-31 DIAGNOSIS — Z0001 Encounter for general adult medical examination with abnormal findings: Secondary | ICD-10-CM | POA: Diagnosis not present

## 2021-01-31 DIAGNOSIS — K219 Gastro-esophageal reflux disease without esophagitis: Secondary | ICD-10-CM | POA: Diagnosis not present

## 2021-01-31 DIAGNOSIS — Z6833 Body mass index (BMI) 33.0-33.9, adult: Secondary | ICD-10-CM | POA: Diagnosis not present

## 2021-01-31 NOTE — Progress Notes (Signed)
[facility] [place],  [address]  Internal MEDICINE  Office Visit Note  Patient Name: Holly Forbes  [id number]  [id number]  Date of Service: 01/31/2021  Chief Complaint  Patient presents with   Medicare Wellness   Diabetes   Gastroesophageal Reflux   Hypertension    HPI Holly Forbes presents for an annual well visit and physical exam. she has a history of  diabetes, GERD, glaucoma, hypertension, arthritis, cataracts, vitamin D deficiency. Surgical history is significant for hysterectomy, cholecystectomy and bilateral cataract extraction. She has received 3 doses of the covid vaccine. She had her mammogram done in march 2022. She has her diabetic eye exam done in august 2022 and her A1C was checked in august 2022 as well. She had a colonoscopy in February this year and is due for another in 2027. She is due for diabetic foot exam today.  She denies any pain, she has no other questions or concerns. She does have some constipation but does not want anything prescribed to help with this problem at the moment, she is managing with OTC products for now.  She has a family member in the hospital right now who is expected to pass away so her stress and anxiety are understandably elevated.    Current Medication: Outpatient Encounter Medications as of 01/31/2021  Medication Sig   amLODipine (NORVASC) 2.5 MG tablet Take one tab po qhs for blood pressure   aspirin 81 MG tablet Take 81 mg by mouth daily.    Blood Glucose Monitoring Suppl (ONETOUCH VERIO FLEX SYSTEM) w/Device KIT Use as directed DX E11.65   canagliflozin (INVOKANA) 300 MG TABS tablet Take 1 tablet (300 mg total) by mouth daily before breakfast.   Cholecalciferol (DIALYVITE VITAMIN D 5000) 125 MCG (5000 UT) capsule Take 5,000 Units by mouth daily.   dorzolamide-timolol (COSOPT) 22.3-6.8 MG/ML ophthalmic solution Place 1 drop into both eyes 2 (two) times daily.    DTaP-IPV Lynann Bologna) injection Inject  0.5 mLs into the muscle once.   furosemide (LASIX) 20 MG tablet TAKE 1 TABLET BY MOUTH DAILY   glucose blood (ONETOUCH VERIO) test strip Use as directed once daily DX E 11.65   ibuprofen (ADVIL) 400 MG tablet Take one tab po bid prn for pain   latanoprost (XALATAN) 0.005 % ophthalmic solution Place 1 drop into both eyes at bedtime.    lisinopril (ZESTRIL) 40 MG tablet Take 1 tablet (40 mg total) by mouth daily.   OneTouch Delica Lancets 50T MISC Use as directed once a daily DX e11.65   pantoprazole (PROTONIX) 20 MG tablet TAKE 1 TABLET BY MOUTH DAILY   rosuvastatin (CRESTOR) 5 MG tablet TAKE 1 TABLET(5 MG) BY MOUTH DAILY   sodium chloride (OCEAN) 0.65 % SOLN nasal spray Place 1 spray into both nostrils as needed for congestion.   vitamin B-12 (CYANOCOBALAMIN) 500 MCG tablet Take 500 mcg by mouth daily.   Zoster Vaccine Adjuvanted [facility]) injection Inject 0.5 mLs into the muscle once.   Facility-Administered Encounter Medications as of 01/31/2021  Medication   triamcinolone acetonide (KENALOG) 10 MG/ML injection 10 mg    Surgical History: Past Surgical History:  Procedure Laterality Date   ABDOMINAL HYSTERECTOMY     BREAST EXCISIONAL BIOPSY Left 2015   cyst removed   CATARACT EXTRACTION W/PHACO Right 03/27/2020   Procedure: CATARACT EXTRACTION PHACO AND INTRAOCULAR LENS PLACEMENT (IOC) RIGHT 5.03 00:50.6 9.9%;  Surgeon: Leandrew Koyanagi, MD;  Location: [place];  Service: Ophthalmology;  Laterality: Right;  CATARACT EXTRACTION W/PHACO Left 04/18/2020   Procedure: CATARACT EXTRACTION PHACO AND INTRAOCULAR LENS PLACEMENT (IOC) LEFT DIABETIC 4.56 00:51.7 8.8%;  Surgeon: Leandrew Koyanagi, MD;  Location: [facility];  Service: Ophthalmology;  Laterality: Left;   CHOLECYSTECTOMY     COLONOSCOPY N/A 09/25/2014   Procedure: COLONOSCOPY;  Surgeon: Lucilla Lame, MD;  Location: [place];  Service: Gastroenterology;  Laterality: N/A;  cecum time- [id number]   COLONOSCOPY WITH  PROPOFOL N/A 07/16/2020   Procedure: COLONOSCOPY WITH PROPOFOL;  Surgeon: Lin Landsman, MD;  Location[facility] ENDOSCOPY;  Service: Endoscopy;  Laterality: N/A;   CYST REMOVAL NECK     ESOPHAGOGASTRODUODENOSCOPY N/A 09/25/2014   Procedure: ESOPHAGOGASTRODUODENOSCOPY (EGD);  Surgeon: Lucilla Lame, MD;  Location: [place];  Service: Gastroenterology;  Laterality: N/A;   ESOPHAGOGASTRODUODENOSCOPY (EGD) WITH PROPOFOL N/A 04/07/2018   Procedure: ESOPHAGOGASTRODUODENOSCOPY (EGD) WITH PROPOFOL;  Surgeon: Toledo, Benay Pike, MD;  Location: [facility];  Service: Gastroenterology;  Laterality: N/A;   POLYPECTOMY  09/25/2014   Procedure: POLYPECTOMY INTESTINAL;  Surgeon: Lucilla Lame, MD;  Location: [place];  Service: Gastroenterology;;    Medical History: 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   Back pain    Left sciatica, low back pain   Cataract    Diabetes mellitus without complication (HCC)    GERD (gastroesophageal reflux disease)    Glaucoma    Hypertension    Vitamin D deficiency     Family History: Family History  Problem Relation Age of Onset   Heart disease Sister    Diabetes Sister    Hypertension Sister    Heart disease Brother    Diabetes Brother    Hypertension Brother    Breast cancer Daughter 73   Colon cancer Mother    Stroke Mother    Lung cancer Father     Social History   Socioeconomic History   Marital status: Married    Spouse name: Not on file   Number of children: Not on file   Years of education: Not on file   Highest education level: Not on file  Occupational History   Not on file  Tobacco Use   Smoking status: Former    Packs/day: 0.25    Years: 15.00    Pack years: 3.75    Types: Cigarettes    Quit date: 1995    Years since quitting: 27.7   Smokeless tobacco: Never  Vaping Use   Vaping Use: Never used  Substance and Sexual Activity   Alcohol use: Yes     Comment: 3-5 glasses wine/month   Drug use: Not Currently   Sexual activity: Not on file  Other Topics Concern   Not on file  Social History Narrative   Not on file   Social Determinants of Health   Financial Resource Strain: Low Risk    Difficulty of Paying Living Expenses: Not very hard  Food Insecurity: Not on file  Transportation Needs: Not on file  Physical Activity: Not on file  Stress: Not on file  Social Connections: Not on file  Intimate Partner Violence: Not on file      Review of Systems  Constitutional:  Negative for activity change, appetite change, chills, fatigue, fever and unexpected weight change.  HENT: Negative.  Negative for congestion, ear pain, rhinorrhea, sore throat and trouble swallowing.   Eyes: Negative.   Respiratory: Negative.  Negative for cough, chest tightness, shortness  of breath and wheezing.   Cardiovascular: Negative.  Negative for chest pain.  Gastrointestinal: Negative.  Negative for abdominal pain, blood in stool, constipation, diarrhea, nausea and vomiting.  Endocrine: Negative.   Genitourinary: Negative.  Negative for difficulty urinating, dysuria, frequency, hematuria and urgency.  Musculoskeletal: Negative.  Negative for arthralgias, back pain, joint swelling, myalgias and neck pain.  Skin: Negative.  Negative for rash and wound.  Allergic/Immunologic: Negative.  Negative for immunocompromised state.  Neurological: Negative.  Negative for dizziness, seizures, numbness and headaches.  Hematological: Negative.   Psychiatric/Behavioral: Negative.  Negative for behavioral problems, self-injury and suicidal ideas. The patient is not nervous/anxious.    Vital Signs: BP 132/72   Pulse 60   Temp 98.7 F (37.1 C)   Resp 16   Ht $R'5\' 7"'WT$  (1.702 m)   Wt 214 lb 6.4 oz (97.3 kg)   LMP  (LMP Unknown)   SpO2 99%   BMI 33.58 kg/m    Physical Exam Vitals reviewed.  Constitutional:      General: She is awake. She is not in acute distress.     Appearance: Normal appearance. She is well-developed and well-groomed. She is obese. She is not ill-appearing or diaphoretic.  HENT:     Head: Normocephalic and atraumatic.     Right Ear: Tympanic membrane, ear canal and external ear normal.     Left Ear: Tympanic membrane, ear canal and external ear normal.     Nose: Nose normal. No congestion or rhinorrhea.     Mouth/Throat:     Lips: Pink.     Mouth: Mucous membranes are moist.     Pharynx: Oropharynx is clear. Uvula midline. No oropharyngeal exudate or posterior oropharyngeal erythema.  Eyes:     General: Lids are normal. Vision grossly intact. Gaze aligned appropriately. No scleral icterus.       Right eye: No discharge.        Left eye: No discharge.     Extraocular Movements: Extraocular movements intact.     Conjunctiva/sclera: Conjunctivae normal.     Pupils: Pupils are equal, round, and reactive to light.     Funduscopic exam:    Right eye: Red reflex present.        Left eye: Red reflex present. Neck:     Thyroid: No thyromegaly.     Vascular: No JVD.     Trachea: Trachea and phonation normal. No tracheal deviation.  Cardiovascular:     Rate and Rhythm: Normal rate and regular rhythm.     Pulses: Normal pulses.          Dorsalis pedis pulses are 2+ on the right side and 2+ on the left side.       Posterior tibial pulses are 2+ on the right side and 2+ on the left side.     Heart sounds: Normal heart sounds, S1 normal and S2 normal. No murmur heard.   No friction rub. No gallop.  Pulmonary:     Effort: Pulmonary effort is normal. No accessory muscle usage or respiratory distress.     Breath sounds: Normal breath sounds and air entry. No stridor. No wheezing or rales.  Chest:     Chest wall: No tenderness.     Comments: Declined breast exam Abdominal:     General: Bowel sounds are normal. There is no distension.     Palpations: Abdomen is soft. There is no shifting dullness, fluid wave, mass or pulsatile mass.      Tenderness: There  is no abdominal tenderness. There is no guarding or rebound.  Musculoskeletal:        General: No tenderness or deformity. Normal range of motion.     Cervical back: Normal range of motion and neck supple.     Right foot: Normal range of motion. No deformity, bunion, Charcot foot, foot drop or prominent metatarsal heads.     Left foot: Normal range of motion. No deformity, bunion, Charcot foot, foot drop or prominent metatarsal heads.  Feet:     Right foot:     Protective Sensation: 6 sites tested.  6 sites sensed.     Skin integrity: Skin integrity normal. No ulcer, blister, skin breakdown, erythema, warmth, callus, dry skin or fissure.     Toenail Condition: Right toenails are normal.     Left foot:     Protective Sensation: 6 sites tested.  6 sites sensed.     Skin integrity: Skin integrity normal. No ulcer, blister, skin breakdown, erythema, warmth, callus, dry skin or fissure.     Toenail Condition: Left toenails are normal.  Lymphadenopathy:     Cervical: No cervical adenopathy.  Skin:    General: Skin is warm and dry.     Capillary Refill: Capillary refill takes less than 2 seconds.     Coloration: Skin is not pale.     Findings: No erythema or rash.  Neurological:     Mental Status: She is alert and oriented to person, place, and time.     Cranial Nerves: No cranial nerve deficit.     Motor: No abnormal muscle tone.     Coordination: Coordination normal.     Gait: Gait normal.     Deep Tendon Reflexes: Reflexes are normal and symmetric.  Psychiatric:        Mood and Affect: Mood and affect normal.        Behavior: Behavior normal. Behavior is cooperative.        Thought Content: Thought content normal.        Judgment: Judgment normal.    Diabetic Foot Exam - Simple   Simple Foot Form Diabetic Foot exam was performed with the following findings: Yes 01/31/2021 11:00 AM  Visual Inspection Sensation Testing Pulse Check Comments        Assessment/Plan: 1. Encounter for general adult medical examination with abnormal findings Age-appropriate preventive screenings and vaccinations discussed, annual physical exam completed. Routine labs for health maintenance ordered, see below. PHM updated. Preventive screenings discussed in HPI.    2. Type 2 diabetes mellitus without complication, without long-term current use of insulin ([place]) Routine labs ordered.  - CBC with Differential/Platelet - CMP14+EGFR - Lipid Profile - TSH + free T4 - Vitamin D (25 hydroxy)  3. Essential hypertension Stable, continue medication as prescribed.   4. Gastroesophageal reflux disease without esophagitis Stable , continue protonix as prescribed   5. Class 1 obesity due to excess calories with serious comorbidity and body mass index (BMI) of 33.0 to 33.9 in adult She has lost 3 lbs since her last office visit, discussed appropriate diet and lifestyle modifications.  Obesity Counseling: Risk Assessment: An assessment of behavioral risk factors was made today and includes lack of exercise sedentary lifestyle, lack of portion control and poor dietary habits.  Risk Modification Advice: She was counseled on portion control guidelines. Discussed decreasing high carb and high sugar food in diet and increasing lean proteins and health fats. The detrimental long term effects of obesity on her health and ongoing  poor compliance was also discussed with the patient.  6. Dysuria Routine urinalysis done - UA/M w/rflx Culture, Routine - Microscopic Examination      General Counseling: Jennalynn verbalizes understanding of the findings of todays visit and agrees with plan of treatment. I have discussed any further diagnostic evaluation that may be needed or ordered today. We also reviewed her medications today. she has been encouraged to call the office with any questions or concerns that should arise related to todays visit.    Orders Placed This  Encounter  Procedures   Microscopic Examination   UA/M w/rflx Culture, Routine   CBC with Differential/Platelet   CMP14+EGFR   Lipid Profile   TSH + free T4   Vitamin D (25 hydroxy)    No orders of the defined types were placed in this encounter.   Return in about 3 months (around 05/02/2021) for F/U, Recheck A1C, Jameka Ivie PCP. Will call with lab results   Total time spent:30 Minutes Time spent includes review of chart, medications, test results, and follow up plan with the patient.   [place] Controlled Substance Database was reviewed by me.  This patient was seen by Jonetta Osgood, FNP-C in collaboration with Dr. Clayborn Bigness as a part of collaborative care agreement.  Yulanda Diggs R. Valetta Fuller, MSN, FNP-C Internal medicine

## 2021-02-01 DIAGNOSIS — E119 Type 2 diabetes mellitus without complications: Secondary | ICD-10-CM | POA: Diagnosis not present

## 2021-02-01 LAB — UA/M W/RFLX CULTURE, ROUTINE
Bilirubin, UA: NEGATIVE
Ketones, UA: NEGATIVE
Leukocytes,UA: NEGATIVE
Nitrite, UA: NEGATIVE
Protein,UA: NEGATIVE
RBC, UA: NEGATIVE
Specific Gravity, UA: 1.007 (ref 1.005–1.030)
Urobilinogen, Ur: 0.2 mg/dL (ref 0.2–1.0)
pH, UA: 6.5 (ref 5.0–7.5)

## 2021-02-01 LAB — MICROSCOPIC EXAMINATION
Bacteria, UA: NONE SEEN
Casts: NONE SEEN /lpf
WBC, UA: NONE SEEN /hpf (ref 0–5)

## 2021-02-02 LAB — CBC WITH DIFFERENTIAL/PLATELET
Basophils Absolute: 0.1 10*3/uL (ref 0.0–0.2)
Basos: 1 %
EOS (ABSOLUTE): 0.1 10*3/uL (ref 0.0–0.4)
Eos: 2 %
Hematocrit: 40 % (ref 34.0–46.6)
Hemoglobin: 12.7 g/dL (ref 11.1–15.9)
Immature Grans (Abs): 0 10*3/uL (ref 0.0–0.1)
Immature Granulocytes: 0 %
Lymphocytes Absolute: 1.9 10*3/uL (ref 0.7–3.1)
Lymphs: 25 %
MCH: 25.3 pg — ABNORMAL LOW (ref 26.6–33.0)
MCHC: 31.8 g/dL (ref 31.5–35.7)
MCV: 80 fL (ref 79–97)
Monocytes Absolute: 0.7 10*3/uL (ref 0.1–0.9)
Monocytes: 9 %
Neutrophils Absolute: 4.7 10*3/uL (ref 1.4–7.0)
Neutrophils: 63 %
Platelets: 300 10*3/uL (ref 150–450)
RBC: 5.02 x10E6/uL (ref 3.77–5.28)
RDW: 15.7 % — ABNORMAL HIGH (ref 11.7–15.4)
WBC: 7.4 10*3/uL (ref 3.4–10.8)

## 2021-02-02 LAB — CMP14+EGFR
ALT: 15 IU/L (ref 0–32)
AST: 16 IU/L (ref 0–40)
Albumin/Globulin Ratio: 1.5 (ref 1.2–2.2)
Albumin: 4.2 g/dL (ref 3.8–4.8)
Alkaline Phosphatase: 89 IU/L (ref 44–121)
BUN/Creatinine Ratio: 18 (ref 12–28)
BUN: 15 mg/dL (ref 8–27)
Bilirubin Total: 0.5 mg/dL (ref 0.0–1.2)
CO2: 22 mmol/L (ref 20–29)
Calcium: 9.4 mg/dL (ref 8.7–10.3)
Chloride: 105 mmol/L (ref 96–106)
Creatinine, Ser: 0.84 mg/dL (ref 0.57–1.00)
Globulin, Total: 2.8 g/dL (ref 1.5–4.5)
Glucose: 133 mg/dL — ABNORMAL HIGH (ref 65–99)
Potassium: 4.2 mmol/L (ref 3.5–5.2)
Sodium: 143 mmol/L (ref 134–144)
Total Protein: 7 g/dL (ref 6.0–8.5)
eGFR: 76 mL/min/{1.73_m2} (ref >59)

## 2021-02-02 LAB — LIPID PANEL
Chol/HDL Ratio: 2.7 ratio (ref 0.0–4.4)
Cholesterol, Total: 146 mg/dL (ref 100–199)
HDL: 54 mg/dL (ref >39)
LDL Chol Calc (NIH): 78 mg/dL (ref 0–99)
Triglycerides: 73 mg/dL (ref 0–149)
VLDL Cholesterol Cal: 14 mg/dL (ref 5–40)

## 2021-02-02 LAB — VITAMIN D 25 HYDROXY (VIT D DEFICIENCY, FRACTURES): Vit D, 25-Hydroxy: 59.3 ng/mL (ref 30.0–100.0)

## 2021-02-02 LAB — TSH+FREE T4
Free T4: 1.21 ng/dL (ref 0.82–1.77)
TSH: 1.54 u[IU]/mL (ref 0.450–4.500)

## 2021-02-15 DIAGNOSIS — I10 Essential (primary) hypertension: Secondary | ICD-10-CM | POA: Diagnosis not present

## 2021-02-15 DIAGNOSIS — E1165 Type 2 diabetes mellitus with hyperglycemia: Secondary | ICD-10-CM | POA: Diagnosis not present

## 2021-02-25 NOTE — Progress Notes (Signed)
Chronic Care Management Pharmacy Note  03/01/2021 Name:  Holly Forbes MRN:  [id number] DOB:  [birth year]  Summary: Initial visit with PharmD.  Reviewed medications. Takes all appropriately and is adherent with all meds.  Recent FBG - 113, 125, 109, 119.  She continues to work on lifestyle mods.  Discussed cholesterol and need for updated lipids, she is currently tolerating Crestor well.  Recommendations/Changes made from today's visit: No changes to meds Recommend repeat lipid panel  Plan: F/u 4 months with PharmD Repeat lipid panel at next visit or physical - if LDL still elevated would recommend increase Crestor to $RemoveBe'20mg'UyPtKyjch$  daily based on high risk ASCVD score.   Subjective: Holly Forbes is an 70 y.o. year old female who is a primary patient of Humphrey Rolls, Timoteo Gaul, MD.  The CCM team was consulted for assistance with disease management and care coordination needs.    Engaged with patient face to face for initial visit in response to provider referral for pharmacy case management and/or care coordination services.   Consent to Services:  The patient was given the following information about Chronic Care Management services today, agreed to services, and gave verbal consent: 1. CCM service includes personalized support from designated clinical staff supervised by the primary care provider, including individualized plan of care and coordination with other care providers 2. 24/7 contact phone numbers for assistance for urgent and routine care needs. 3. Service will only be billed when office clinical staff spend 20 minutes or more in a month to coordinate care. 4. Only one practitioner may furnish and bill the service in a calendar month. 5.The patient may stop CCM services at any time (effective at the end of the month) by phone call to the office staff. 6. The patient will be responsible for cost sharing (co-pay) of up to 20% of the service fee (after annual deductible is met).  Patient agreed to services and consent obtained.  Patient Care Team: Lavera Guise, MD as PCP - General (Internal Medicine) Edythe Clarity, [facility] as Pharmacist (Pharmacist)  Recent office visits: 08/21/20 Mylinda Latina, PA-C. For follow-up. Per note:Awaiting APAP still. No medication changes.    06/22/20 Luiz Ochoa, NP. For follow-up. No medication changes.  05/29/20 Lavera Guise, MD. For follow-up. STARTED Canagliflozin 300 mg daily. CHANGED Lisinopril to 40 mg daily. Per note: Will start Autopap  04/26/20 Lavera Guise, MD. For type 2 DM. STARTED Furosemide 20 mg daily STOPPED Hydrochlorothiazide. Per note: Continues to have elevated hga1c, samples of Farxiga 5 mg po qd x 3 weeks and 10 mg po qd after that  04/24/20 Lavera Guise, MD. For follow-up. STARTED Amlodipine Besylate 2.5 mg 1 tablet PO QHS. Per note:Start Lasix ( BLE) 20 mg po qd. Stop hctz    Recent consult visits:  06/28/20 Janace Litten, Evangeline Gula, MD. For history of colonic polyps. STARTED Na Sulfate-K Sulfate-Mg Sulf 17.5-3.13-1.6 GM/177ML 1 kit once.    Hospital visits:  07/16/20 [facility] (1 Hour) Vanga, Tally Due, MD. For colonoscopy.    Medication History: Canagliflozin 300 mg 90 DS 08/27/20 Lisinopril 40 mg 90 DS 08/15/20 Rosuvastatin 5 mg 30 DS 08/27/20 Objective:  Lab Results  Component Value Date   CREATININE 0.84 02/01/2021   BUN 15 02/01/2021   GFRNONAA 74 10/05/2019   GFRAA 85 10/05/2019   NA 143 02/01/2021   K 4.2 02/01/2021   CALCIUM 9.4 02/01/2021   CO2 22 02/01/2021   GLUCOSE 133 (H) 02/01/2021  Lab Results  Component Value Date/Time   HGBA1C 6.8 (A) 12/20/2020 02:30 PM   HGBA1C 6.7 (A) 08/21/2020 10:40 AM   MICROALBUR <30 04/26/2020 11:26 AM    Last diabetic Eye exam:  Lab Results  Component Value Date/Time   HMDIABEYEEXA No Retinopathy 01/10/2021 12:00 AM    Last diabetic Foot exam: No results found for: HMDIABFOOTEX   Lab Results  Component Value Date    CHOL 146 02/01/2021   HDL 54 02/01/2021   LDLCALC 78 02/01/2021   TRIG 73 02/01/2021   CHOLHDL 2.7 02/01/2021    Hepatic Function Latest Ref Rng & Units 02/01/2021 10/05/2019 01/21/2018  Total Protein 6.0 - 8.5 g/dL 7.0 7.1 6.7  Albumin 3.8 - 4.8 g/dL 4.2 4.0 4.1  AST 0 - 40 IU/L $Remov'16 14 13  'AYXFsf$ ALT 0 - 32 IU/L $Remov'15 17 16  'SEcaLP$ Alk Phosphatase 44 - 121 IU/L 89 107 105  Total Bilirubin 0.0 - 1.2 mg/dL 0.5 0.5 0.5    Lab Results  Component Value Date/Time   TSH 1.540 02/01/2021 08:37 AM   TSH 1.240 10/05/2019 09:11 AM   FREET4 1.21 02/01/2021 08:37 AM   FREET4 1.12 10/05/2019 09:11 AM    CBC Latest Ref Rng & Units 02/01/2021 10/05/2019 01/21/2018  WBC 3.4 - 10.8 x10E3/uL 7.4 7.1 8.0  Hemoglobin 11.1 - 15.9 g/dL 12.7 12.7 12.5  Hematocrit 34.0 - 46.6 % 40.0 39.8 39.0  Platelets 150 - 450 x10E3/uL 300 354 317    Lab Results  Component Value Date/Time   VD25OH 59.3 02/01/2021 08:37 AM   VD25OH 68.5 10/05/2019 09:11 AM    Clinical ASCVD: No  The 10-year ASCVD risk score (Arnett DK, et al., 2019) is: 19.5%   Values used to calculate the score:     Age: 70 years     Sex: Female     Is Non-Hispanic African American: Yes     Diabetic: Yes     Tobacco smoker: No     Systolic Blood Pressure: 662 mmHg     Is BP treated: Yes     HDL Cholesterol: 54 mg/dL     Total Cholesterol: 146 mg/dL    Depression screen [facility] 2/9 01/31/2021 12/20/2020 08/21/2020  Decreased Interest 0 0 0  Down, Depressed, Hopeless 0 0 0  PHQ - 2 Score 0 0 0      Social History   Tobacco Use  Smoking Status Former   Packs/day: 0.25   Years: 15.00   Pack years: 3.75   Types: Cigarettes   Quit date: 1995   Years since quitting: 27.8  Smokeless Tobacco Never   BP Readings from Last 3 Encounters:  01/31/21 132/72  12/20/20 128/76  11/29/20 (!) 144/85   Pulse Readings from Last 3 Encounters:  01/31/21 60  12/20/20 73  11/29/20 74   Wt Readings from Last 3 Encounters:  01/31/21 214 lb 6.4 oz (97.3 kg)  12/20/20  217 lb (98.4 kg)  11/29/20 217 lb (98.4 kg)   BMI Readings from Last 3 Encounters:  01/31/21 33.58 kg/m  12/20/20 33.99 kg/m  11/29/20 33.99 kg/m    Assessment/Interventions: Review of patient past medical history, allergies, medications, health status, including review of consultants reports, laboratory and other test data, was performed as part of comprehensive evaluation and provision of chronic care management services.   SDOH:  (Social Determinants of Health) assessments and interventions performed: Yes   Financial Resource Strain: Low Risk    Difficulty of Paying Living Expenses: Not very  hard    SDOH Screenings   Alcohol Screen: Low Risk    Last Alcohol Screening Score (AUDIT): 3  Depression (PHQ2-9): Low Risk    PHQ-2 Score: 0  Financial Resource Strain: Low Risk    Difficulty of Paying Living Expenses: Not very hard  Food Insecurity: Not on file  Housing: Not on file  Physical Activity: Not on file  Social Connections: Not on file  Stress: Not on file  Tobacco Use: Medium Risk   Smoking Tobacco Use: Former   Smokeless Tobacco Use: Never  Transportation Needs: Not on file    [place]  No Known Allergies  Medications Reviewed Today     Reviewed by Edythe Clarity, [place] (Pharmacist) on 03/01/21 at 1124  Med List Status: <None>   Medication Order Taking? Sig Documenting Provider Last Dose Status Informant  amLODipine (NORVASC) 2.5 MG tablet [id number] Yes Take one tab po qhs for blood pressure Lavera Guise, MD Taking Active   aspirin 81 MG tablet [id number] Yes Take 81 mg by mouth daily.  [provider] Taking Active Self  Blood Glucose Monitoring Suppl [facility] VERIO FLEX SYSTEM) w/Device KIT [id number] Yes Use as directed DX E11.65 Lavera Guise, MD Taking Active   canagliflozin [facility]) 300 MG TABS tablet [id number] Yes Take 1 tablet (300 mg total) by mouth daily before breakfast. Lavera Guise, MD Taking Active   Cholecalciferol (DIALYVITE  VITAMIN D 5000) 125 MCG (5000 UT) capsule [id number] Yes Take 5,000 Units by mouth daily. [provider] Taking Active Self  dorzolamide-timolol (COSOPT) 22.3-6.8 MG/ML ophthalmic solution [id number] Yes Place 1 drop into both eyes 2 (two) times daily.  [provider] Taking Active Self  DTaP-IPV Lynann [place]) injection [id number] Yes Inject 0.5 mLs into the muscle once. [provider] Taking Active   furosemide (LASIX) 20 MG tablet [id number] Yes TAKE 1 TABLET BY MOUTH DAILY Lavera Guise, MD Taking Active   glucose blood [facility] VERIO) test strip [id number] Yes Use as directed once daily DX E 11.65 Lavera Guise, MD Taking Active   ibuprofen (ADVIL) 400 MG tablet [id number] Yes Take one tab po bid prn for pain Lavera Guise, MD Taking Active   latanoprost (XALATAN) 0.005 % ophthalmic solution [id number] Yes Place 1 drop into both eyes at bedtime.  [provider] Taking Active Self  lisinopril (ZESTRIL) 40 MG tablet [id number] Yes Take 1 tablet (40 mg total) by mouth daily. Lavera Guise, MD Taking Active   OneTouch Delica Lancets 16L MISC [id number] Yes Use as directed once a daily DX e11.65 Lavera Guise, MD Taking Active   pantoprazole (PROTONIX) 20 MG tablet [id number] Yes TAKE 1 TABLET BY MOUTH DAILY Lavera Guise, MD Taking Active   rosuvastatin (CRESTOR) 5 MG tablet [id number] Yes TAKE 1 TABLET(5 MG) BY MOUTH DAILY Lavera Guise, MD Taking Active   sodium chloride (OCEAN) 0.65 % SOLN nasal spray [id number] Yes Place 1 spray into both nostrils as needed for congestion. [provider] Taking Active Self  triamcinolone acetonide (KENALOG) 10 MG/ML injection 10 mg [id number]   Landis Martins, DPM  Active   vitamin B-12 (CYANOCOBALAMIN) 500 MCG tablet [id number] Yes Take 500 mcg by mouth daily. [provider] Taking Active Self  Zoster Vaccine Adjuvanted [facility]) injection [id number] Yes Inject 0.5 mLs into the muscle once. [provider] Taking  Active             Patient Active Problem List   Diagnosis  Date Noted   Family history of colon cancer    Stenosis of right carotid artery 73/71/0626   Diastolic dysfunction 94/85/4627   Hyperlipidemia associated with type 2 diabetes mellitus ([place]) 02/15/2020   Bilateral groin pain 02/15/2020   Encounter for screening mammogram for malignant neoplasm of breast 02/15/2020   Palpitations 10/11/2019   Screening for colon cancer 10/11/2019   Encounter for hepatitis C screening test for low risk patient 07/06/2019   Type 2 diabetes mellitus without complication, without long-term current use of insulin ([place]) 08/16/2018   Inflammatory pain of left heel 05/17/2018   Primary generalized (osteo)arthritis 05/17/2018   Allergic rhinitis due to pollen 02/15/2018   Encounter for general adult medical examination with abnormal findings 01/28/2018   Uncontrolled type 2 diabetes mellitus with hyperglycemia ([place]) 01/28/2018   Acute non-recurrent sinusitis 01/28/2018   Ovarian failure 01/28/2018   Need for vaccination against Streptococcus pneumoniae using pneumococcal conjugate vaccine 13 01/28/2018   Dysuria 01/28/2018   Gastroesophageal reflux disease without esophagitis 10/19/2017   Essential hypertension 10/19/2017    Immunization History  Administered Date(s) Administered   Fluad Quad(high Dose 65+) 02/04/2019, 03/04/2019   Moderna Sars-Covid-2 Vaccination 07/02/2019, 07/30/2019, 04/19/2020   Pneumococcal Polysaccharide-23 01/29/2020    Conditions to be addressed/monitored:  HTN, GERD, Type II DM, HLD  Care Plan : General Pharmacy (Adult)  Updates made by Edythe Clarity, RPH since 03/01/2021 12:00 AM     Problem: HTN, GERD, Type II DM, HLD   Priority: High  Onset Date: 10/24/2020     Long-Range Goal: Patient-Specific Goal   Start Date: 10/24/2020  Expected End Date: 04/25/2021  Recent Progress: On track  Priority: High  Note:   Current Barriers:  Unable to achieve  control of glucose  Possible invokana cost barrier  Pharmacist Clinical Goal(s):  Patient will achieve control of glucose and lipids as evidenced by updated labs adhere to plan to optimize therapeutic regimen for cholesterol as evidenced by report of adherence to recommended medication management changes adhere to prescribed medication regimen as evidenced by fill dates contact provider office for questions/concerns as evidenced notation of same in electronic health record through collaboration with PharmD and provider.   Interventions: 1:1 collaboration with Lavera Guise, MD regarding development and update of comprehensive plan of care as evidenced by provider attestation and co-signature Inter-disciplinary care team collaboration (see longitudinal plan of care) Comprehensive medication review performed; medication list updated in electronic medical record  Hypertension (BP goal <140/90) -Controlled -Current treatment: Amlodipine 2.5mg  daily Lisinopril 40mg  daily -Medications previously tried: HCTZ  -Current home readings: 120s-130s/70s -Current dietary habits: she has cut back on fried foods, drinks mainly water, cut back on sweets and carbs, -Current exercise habits: she does electric bike or walks 2-3 times per week -Denies hypotensive/hypertensive symptoms -Educated on BP goals and benefits of medications for prevention of heart attack, stroke and kidney damage; Exercise goal of 150 minutes per week; Importance of home blood pressure monitoring; Symptoms of hypotension and importance of maintaining adequate hydration; -Counseled to monitor BP at home at least a few times per week, document, and provide log at future appointments -Recommended to continue current medication  Update 03/01/21 112/61, 125/62 - home BP today Denies any symptoms no changes to meds needed  Hyperlipidemia: (LDL goal < 70) -Uncontrolled -Current treatment: Rosuvastatin 5mg  -Medications previously  tried: none noted  -Current dietary patterns: see above -Current exercise habits: see above -Educated on Cholesterol goals;  Benefits of statin for ASCVD risk reduction; Importance  of limiting foods high in cholesterol; Exercise goal of 150 minutes per week; Reviewed last lipid panel -Recommended to continue current medication Recommended repeat lipid panel upcoming CPE.  She has been on Crestor and has made lifestyle changes since last lipid panel.  Recommend recheck, if still elevated would recommend increasing to Rosuvastatin 20mg  daily based on ASCVD risk score (22.9% - high risk) and comorbidites.  Update 03/01/21 LDL improved at most recent lipid panel. Patient working hard on lifestyle mods. ASCVD risk now 19.5% which is reduction.   OK with leaving statin dose as is, continue adherence!  Diabetes (A1c goal <6.5%) -Not ideally controlled -Current medications: Invokana 300mg  daily -Medications previously tried: metformin  -Current home glucose readings fasting glucose: 100-130s, 113, 125, 109, 119 this week so far post prandial glucose:  -Denies hypoglycemic/hyperglycemic symptoms -Current meal patterns: cut back on carbs, sweets, fried foods -Current exercise: boking/walking 2-3 times per week -Educated on A1c and blood sugar goals; Complications of diabetes including kidney damage, retinal damage, and cardiovascular disease; Benefits of routine self-monitoring of blood sugar; -Counseled to check feet daily and get yearly eye exams -Recommended to continue current medication Recommended repeat A1c and continue current lifestyle modifications.  Update 03/01/21 FBG - mostly < 130.  She knows the times that it is above this is when she has eaten something that causes the spike.  She does report Anastasio Auerbach is a cost barrier.  However, she can afford it she just does not like to.   Plans to work really hard on diet to where she does not need medication.  Last A1c remained  relatively the same. No changes to meds warranted at this time, continue work on lifestyle mods and dietary choices.  GERD (Goal: minimize symptoms) -Controlled -Current treatment  Pantoprazole 20mg  daily -Medications previously tried: none noted -Takes appropriately about 30 minutes before food in the morning -Denies recent symptoms unless she eats something she knows triggers it  -Recommended to continue current medication   Patient Goals/Self-Care Activities Patient will:  - take medications as prescribed check glucose daily, document, and provide at future appointments check blood pressure a few times per week, document, and provide at future appointments target a minimum of 150 minutes of moderate intensity exercise weekly  Follow Up Plan: The care management team will reach out to the patient again over the next 120 days.             Medication Assistance: None required.  Patient affirms current coverage meets needs.  Compliance/Adherence/Medication fill history: Care Gaps: Needs Eye Exam Shingles Vaccine  Star-Rating Drugs: Lisinopril 40 mg 90 DS 08/15/20 Rosuvastatin 5 mg 30 DS 08/27/20  Patient's preferred pharmacy is:  [id number] [address], [address] [place] [address] Phone: [phone] Fax: [phone]  Uses pill box? Yes Pt endorses 100% compliance  We discussed: Benefits of medication synchronization, packaging and delivery as well as enhanced pharmacist oversight with [employer]. Patient decided to: Continue current medication management strategy  Care Plan and Follow Up Patient Decision:  Patient agrees to Care Plan and Follow-up.  Plan: The care management team will reach out to the patient again over the next 120 days.  Beverly Milch, PharmD Clinical Pharmacist [facility] [phone]

## 2021-03-01 ENCOUNTER — Ambulatory Visit: Payer: PPO | Admitting: Pharmacist

## 2021-03-01 DIAGNOSIS — E119 Type 2 diabetes mellitus without complications: Secondary | ICD-10-CM

## 2021-03-01 DIAGNOSIS — E785 Hyperlipidemia, unspecified: Secondary | ICD-10-CM

## 2021-03-01 DIAGNOSIS — E1169 Type 2 diabetes mellitus with other specified complication: Secondary | ICD-10-CM

## 2021-03-01 NOTE — Patient Instructions (Addendum)
Visit Information   Goals Addressed             This Visit's Progress    Monitor and Manage My Blood Sugar-Diabetes Type 2   On track    Timeframe:  Long-Range Goal Priority:  High Start Date:  10/24/20                           Expected End Date:   04/25/21                    Follow Up Date 02/15/21   - check blood sugar at prescribed times - check blood sugar if I feel it is too high or too low - take the blood sugar log to all doctor visits - take the blood sugar meter to all doctor visits    Why is this important?   Checking your blood sugar at home helps to keep it from getting very high or very low.  Writing the results in a diary or log helps the doctor know how to care for you.  Your blood sugar log should have the time, date and the results.  Also, write down the amount of insulin or other medicine that you take.  Other information, like what you ate, exercise done and how you were feeling, will also be helpful.     Notes:        Patient Care Plan: General Pharmacy (Adult)     Problem Identified: HTN, GERD, Type II DM, HLD   Priority: High  Onset Date: 10/24/2020     Long-Range Goal: Patient-Specific Goal   Start Date: 10/24/2020  Expected End Date: 04/25/2021  Recent Progress: On track  Priority: High  Note:   Current Barriers:  Unable to achieve control of glucose  Possible invokana cost barrier  Pharmacist Clinical Goal(s):  Patient will achieve control of glucose and lipids as evidenced by updated labs adhere to plan to optimize therapeutic regimen for cholesterol as evidenced by report of adherence to recommended medication management changes adhere to prescribed medication regimen as evidenced by fill dates contact provider office for questions/concerns as evidenced notation of same in electronic health record through collaboration with PharmD and provider.   Interventions: 1:1 collaboration with Lavera Guise, MD regarding development and update  of comprehensive plan of care as evidenced by provider attestation and co-signature Inter-disciplinary care team collaboration (see longitudinal plan of care) Comprehensive medication review performed; medication list updated in electronic medical record  Hypertension (BP goal <140/90) -Controlled -Current treatment: Amlodipine 2.5mg  daily Lisinopril 40mg  daily -Medications previously tried: HCTZ  -Current home readings: 120s-130s/70s -Current dietary habits: she has cut back on fried foods, drinks mainly water, cut back on sweets and carbs, -Current exercise habits: she does electric bike or walks 2-3 times per week -Denies hypotensive/hypertensive symptoms -Educated on BP goals and benefits of medications for prevention of heart attack, stroke and kidney damage; Exercise goal of 150 minutes per week; Importance of home blood pressure monitoring; Symptoms of hypotension and importance of maintaining adequate hydration; -Counseled to monitor BP at home at least a few times per week, document, and provide log at future appointments -Recommended to continue current medication  Update 03/01/21 112/61, 125/62 - home BP today Denies any symptoms no changes to meds needed  Hyperlipidemia: (LDL goal < 70) -Uncontrolled -Current treatment: Rosuvastatin 5mg  -Medications previously tried: none noted  -Current dietary patterns: see above -Current exercise habits: see above -  Educated on Cholesterol goals;  Benefits of statin for ASCVD risk reduction; Importance of limiting foods high in cholesterol; Exercise goal of 150 minutes per week; Reviewed last lipid panel -Recommended to continue current medication Recommended repeat lipid panel upcoming CPE.  She has been on Crestor and has made lifestyle changes since last lipid panel.  Recommend recheck, if still elevated would recommend increasing to Rosuvastatin 20mg  daily based on ASCVD risk score (22.9% - high risk) and  comorbidites.  Update 03/01/21 LDL improved at most recent lipid panel. Patient working hard on lifestyle mods. ASCVD risk now 19.5% which is reduction.   OK with leaving statin dose as is, continue adherence!  Diabetes (A1c goal <6.5%) -Not ideally controlled -Current medications: Invokana 300mg  daily -Medications previously tried: metformin  -Current home glucose readings fasting glucose: 100-130s, 113, 125, 109, 119 this week so far post prandial glucose:  -Denies hypoglycemic/hyperglycemic symptoms -Current meal patterns: cut back on carbs, sweets, fried foods -Current exercise: boking/walking 2-3 times per week -Educated on A1c and blood sugar goals; Complications of diabetes including kidney damage, retinal damage, and cardiovascular disease; Benefits of routine self-monitoring of blood sugar; -Counseled to check feet daily and get yearly eye exams -Recommended to continue current medication Recommended repeat A1c and continue current lifestyle modifications.  Update 03/01/21 FBG - mostly < 130.  She knows the times that it is above this is when she has eaten something that causes the spike.  She does report Anastasio Auerbach is a cost barrier.  However, she can afford it she just does not like to.   Plans to work really hard on diet to where she does not need medication.  Last A1c remained relatively the same. No changes to meds warranted at this time, continue work on lifestyle mods and dietary choices.  GERD (Goal: minimize symptoms) -Controlled -Current treatment  Pantoprazole 20mg  daily -Medications previously tried: none noted -Takes appropriately about 30 minutes before food in the morning -Denies recent symptoms unless she eats something she knows triggers it  -Recommended to continue current medication   Patient Goals/Self-Care Activities Patient will:  - take medications as prescribed check glucose daily, document, and provide at future appointments check blood  pressure a few times per week, document, and provide at future appointments target a minimum of 150 minutes of moderate intensity exercise weekly  Follow Up Plan: The care management team will reach out to the patient again over the next 120 days.            Patient verbalizes understanding of instructions provided today and agrees to view in [place].  Telephone follow up appointment with pharmacy team member scheduled for: 6 months  Edythe Clarity, [place]

## 2021-03-18 ENCOUNTER — Ambulatory Visit: Payer: PPO | Admitting: Physician Assistant

## 2021-03-18 DIAGNOSIS — I10 Essential (primary) hypertension: Secondary | ICD-10-CM | POA: Diagnosis not present

## 2021-03-18 DIAGNOSIS — E1165 Type 2 diabetes mellitus with hyperglycemia: Secondary | ICD-10-CM | POA: Diagnosis not present

## 2021-04-05 ENCOUNTER — Emergency Department
Admission: EM | Admit: 2021-04-05 | Discharge: 2021-04-05 | Disposition: A | Discharge status: Home / Self Care | Payer: PPO | Attending: Emergency Medicine | Admitting: Emergency Medicine

## 2021-04-05 ENCOUNTER — Encounter: Payer: Self-pay | Admitting: Emergency Medicine

## 2021-04-05 ENCOUNTER — Encounter: Payer: Self-pay | Admitting: Nurse Practitioner

## 2021-04-05 ENCOUNTER — Other Ambulatory Visit: Payer: Self-pay

## 2021-04-05 ENCOUNTER — Ambulatory Visit (INDEPENDENT_AMBULATORY_CARE_PROVIDER_SITE_OTHER): Payer: PPO | Admitting: Nurse Practitioner

## 2021-04-05 ENCOUNTER — Emergency Department: Payer: PPO

## 2021-04-05 VITALS — BP 128/70 | HR 66 | Temp 98.6°F | Resp 16 | Ht 67.0 in | Wt 209.0 lb

## 2021-04-05 DIAGNOSIS — I10 Essential (primary) hypertension: Secondary | ICD-10-CM | POA: Diagnosis not present

## 2021-04-05 DIAGNOSIS — R55 Syncope and collapse: Secondary | ICD-10-CM | POA: Diagnosis not present

## 2021-04-05 DIAGNOSIS — R519 Headache, unspecified: Secondary | ICD-10-CM

## 2021-04-05 DIAGNOSIS — R42 Dizziness and giddiness: Secondary | ICD-10-CM | POA: Insufficient documentation

## 2021-04-05 DIAGNOSIS — E785 Hyperlipidemia, unspecified: Secondary | ICD-10-CM | POA: Insufficient documentation

## 2021-04-05 DIAGNOSIS — E1169 Type 2 diabetes mellitus with other specified complication: Secondary | ICD-10-CM | POA: Insufficient documentation

## 2021-04-05 DIAGNOSIS — Z79899 Other long term (current) drug therapy: Secondary | ICD-10-CM | POA: Insufficient documentation

## 2021-04-05 DIAGNOSIS — Z20822 Contact with and (suspected) exposure to covid-19: Secondary | ICD-10-CM | POA: Diagnosis not present

## 2021-04-05 DIAGNOSIS — Z87891 Personal history of nicotine dependence: Secondary | ICD-10-CM | POA: Insufficient documentation

## 2021-04-05 DIAGNOSIS — E119 Type 2 diabetes mellitus without complications: Secondary | ICD-10-CM | POA: Diagnosis not present

## 2021-04-05 DIAGNOSIS — Z7982 Long term (current) use of aspirin: Secondary | ICD-10-CM | POA: Diagnosis not present

## 2021-04-05 LAB — BASIC METABOLIC PANEL
Anion gap: 8 (ref 5–15)
BUN: 11 mg/dL (ref 8–23)
CO2: 25 mmol/L (ref 22–32)
Calcium: 9.2 mg/dL (ref 8.9–10.3)
Chloride: 100 mmol/L (ref 98–111)
Creatinine, Ser: 0.83 mg/dL (ref 0.44–1.00)
GFR, Estimated: >60 mL/min (ref >60)
Glucose, Bld: 114 mg/dL — ABNORMAL HIGH (ref 70–99)
Potassium: 3.7 mmol/L (ref 3.5–5.1)
Sodium: 133 mmol/L — ABNORMAL LOW (ref 135–145)

## 2021-04-05 LAB — CBC
HCT: 42.6 % (ref 36.0–46.0)
Hemoglobin: 13.5 g/dL (ref 12.0–15.0)
MCH: 25.8 pg — ABNORMAL LOW (ref 26.0–34.0)
MCHC: 31.7 g/dL (ref 30.0–36.0)
MCV: 81.3 fL (ref 80.0–100.0)
Platelets: 344 10*3/uL (ref 150–400)
RBC: 5.24 MIL/uL — ABNORMAL HIGH (ref 3.87–5.11)
RDW: 15.5 % (ref 11.5–15.5)
WBC: 9.3 10*3/uL (ref 4.0–10.5)
nRBC: 0 % (ref 0.0–0.2)

## 2021-04-05 LAB — RESP PANEL BY RT-PCR (FLU A&B, COVID) ARPGX2
Influenza A by PCR: NEGATIVE
Influenza B by PCR: NEGATIVE
SARS Coronavirus 2 by RT PCR: NEGATIVE

## 2021-04-05 LAB — TROPONIN I (HIGH SENSITIVITY): Troponin I (High Sensitivity): 4 ng/L (ref <18)

## 2021-04-05 MED ORDER — SODIUM CHLORIDE 0.9 % IV BOLUS
1000.0000 mL | Freq: Once | INTRAVENOUS | Status: AC
  Administered 2021-04-05: 1000 mL via INTRAVENOUS

## 2021-04-05 NOTE — ED Notes (Signed)
Pt taken to CT at this time.

## 2021-04-05 NOTE — Progress Notes (Signed)
[facility] [place] [address] [place], [place] [address]  Internal MEDICINE  Office Visit Note  Patient Name: Holly Forbes  [id number]  [id number]  Date of Service: 04/05/2021  Chief Complaint  Patient presents with   Acute Visit    passed out last night and Bp was low      HPI Holly Forbes presents for an acute sick visit for syncopal episode last night with low blood pressure, not sure what it was then Assessed by EMS last night, screened for stroke symptoms and was negative. EKG was unremarkable by EMS.  Patienr reports feeling dizzy around 9 PM, sat down felt better, got up to finish cooking. Then felt even worse, sat down called daughter over and passed out for about 1 minute. Reports that her head was hurting prior to having syncope and then also hurting after she woke up. She is still having moments of dizziness and feeling like something is not right. Had urinary incontinence when she passed out.      Current Medication:  Outpatient Encounter Medications as of 04/05/2021  Medication Sig   amLODipine (NORVASC) 2.5 MG tablet Take one tab po qhs for blood pressure   aspirin 81 MG tablet Take 81 mg by mouth daily.    Blood Glucose Monitoring Suppl (ONETOUCH VERIO FLEX SYSTEM) w/Device KIT Use as directed DX E11.65   canagliflozin (INVOKANA) 300 MG TABS tablet Take 1 tablet (300 mg total) by mouth daily before breakfast.   Cholecalciferol (DIALYVITE VITAMIN D 5000) 125 MCG (5000 UT) capsule Take 5,000 Units by mouth daily.   cyanocobalamin 1000 MCG tablet Take 1,000 mcg by mouth daily.   dorzolamide-timolol (COSOPT) 22.3-6.8 MG/ML ophthalmic solution Place 1 drop into both eyes every evening.   furosemide (LASIX) 20 MG tablet TAKE 1 TABLET BY MOUTH DAILY   glucose blood (ONETOUCH VERIO) test strip Use as directed once daily DX E 11.65   ibuprofen (ADVIL) 400 MG tablet Take one tab po bid prn for pain   latanoprost (XALATAN) 0.005 % ophthalmic solution Place 1 drop  into both eyes at bedtime.    lisinopril (ZESTRIL) 40 MG tablet Take 1 tablet (40 mg total) by mouth daily.   OneTouch Delica Lancets 29F MISC Use as directed once a daily DX e11.65   pantoprazole (PROTONIX) 20 MG tablet TAKE 1 TABLET BY MOUTH DAILY   rosuvastatin (CRESTOR) 5 MG tablet TAKE 1 TABLET(5 MG) BY MOUTH DAILY (Patient taking differently: Take 5 mg by mouth at bedtime.)   [DISCONTINUED] DTaP-IPV [facility]) injection Inject 0.5 mLs into the muscle once.   [DISCONTINUED] sodium chloride (OCEAN) 0.65 % SOLN nasal spray Place 1 spray into both nostrils as needed for congestion.   [DISCONTINUED] Zoster Vaccine Adjuvanted [facility]) injection Inject 0.5 mLs into the muscle once.   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  HENT:  Negative for congestion, rhinorrhea, sneezing and sore throat.   Eyes:  Negative for redness.  Respiratory:  Negative for cough, chest tightness, shortness of breath and wheezing.   Cardiovascular:  Negative for chest pain and palpitations.  Gastrointestinal:  Negative for abdominal pain, constipation, diarrhea, nausea and vomiting.  Genitourinary:  Negative for dysuria and frequency.       Urinary incontinence.   Musculoskeletal:  Negative for arthralgias, back  pain, joint swelling and neck pain.  Skin:  Negative for rash.  Neurological:  Positive for dizziness, syncope and light-headedness. Negative for tremors and numbness.  Hematological:  Negative for adenopathy. Does not bruise/bleed easily.  Psychiatric/Behavioral:  Negative for behavioral problems (Depression), sleep disturbance and suicidal ideas. The patient is not nervous/anxious.    Physical Exam Vitals reviewed.  Constitutional:      General: She is not in acute distress.    Appearance: Normal appearance. She is obese. She is not ill-appearing.  HENT:     Head: Normocephalic and atraumatic.  Eyes:     Pupils: Pupils are equal, round, and reactive to light.  Cardiovascular:     Rate and Rhythm: Normal rate and regular rhythm.  Pulmonary:     Effort: Pulmonary effort is normal. No respiratory distress.  Neurological:     Mental Status: She is alert and oriented to person, place, and time.     Cranial Nerves: No cranial nerve deficit.     Coordination: Coordination normal.     Gait: Gait normal.  Psychiatric:        Mood and Affect: Mood normal.        Behavior: Behavior normal.      Assessment/Plan: 1. Syncope, unspecified syncope type Syncope with undetermined cause, patient reports that she is still having dizziness and headache that feels like pressure. The patient was instructed to have her husband drive her to the hospital ER from the clinic after her office visit. Patient agreed and went to the ER after leaving the clinic.   2. Type 2 diabetes mellitus without complication, without long-term current use of insulin ([place]) Patient has diabetes so an episode of hypoglycemia may have been a factor in the syncopal episode. Patient instructed to check her glucose level when she starts feeling dizzy or lightheaded.   3. Essential hypertension The EMS that came to her house in response to the syncopal episode indicated that a low blood pressure may have been a factor. Her blood  pressure is stable today. Patient as instructed to check her blood pressure anytime she starts feeling like she is having this type of episode again.    General Counseling: Holly Forbes understanding of the findings of todays visit and agrees with plan of treatment. I have discussed any further diagnostic evaluation that may be needed or ordered today. We also reviewed her medications today. she has been encouraged to call the office with any questions or concerns that should arise related to todays visit.    Counseling:    No orders of the defined types were placed in this encounter.    No orders of the defined types were placed in this encounter.    Return for  , instructed to go to nearest ER, [facility] f/u after ED  visit. .  [place] Controlled Substance Database was reviewed by me for overdose risk score (ORS)  Time spent:20 Minutes Time spent with patient included reviewing progress notes, labs, imaging studies, and discussing plan for follow up.   This patient was seen by Holly Osgood, FNP-C in collaboration with Dr. Clayborn Forbes as a part of collaborative care agreement.  Holly Forbes R. Valetta Fuller, MSN, FNP-C Internal Medicine

## 2021-04-05 NOTE — ED Triage Notes (Signed)
Pt comes into the ED via POV c/o syncopal episode last night while she was sitting down.  Pt states when this happened, she lost control of her bladder.  Pt denies any h/o seizures.  Pt denies any CP or nausea.  Pt does admit to dizziness and a headache that started yesterday.

## 2021-04-05 NOTE — ED Provider Notes (Signed)
[facility] Emergency Department Provider Note   ____________________________________________   Event Date/Time   First MD Initiated Contact with Patient 04/05/21 1152     (approximate)  I have reviewed the triage vital signs and the nursing notes.   HISTORY  Chief Complaint Loss of Consciousness    HPI Holly Forbes is a 70 y.o. female with past medical history of hypertension, hyperlipidemia, diabetes, and GERD who presents to the ED complaining of syncope.  Patient reports that she began feeling lightheaded and dizzy last night while she was cooking dinner.  She went to sit down for a moment and felt better, then felt worse again upon standing up.  She again went to sit down, but does not remember what happened after that.  She believes she woke up a few minutes later, was found sitting there by her husband.  She denies any associated chest pain or shortness of breath, has continued to feel lightheaded and dizzy intermittently since last night.  She does state that she has been dealing with an intermittent headache since this morning that has gradually become more severe and constant.  It primarily affects both temples and is described as an aching.  She denies any associated vision changes, speech changes, numbness, or weakness.  She has never had similar episodes in the past and denies any cardiac history.        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   Back pain    Left sciatica, low back pain   Cataract    Diabetes mellitus without complication (HCC)    GERD (gastroesophageal reflux disease)    Glaucoma    Hypertension    Vitamin D deficiency     Patient Active Problem List   Diagnosis Date Noted   Family history of colon cancer    Stenosis of right carotid artery 38/93/7342   Diastolic dysfunction 87/68/1157   Hyperlipidemia associated with type 2 diabetes mellitus ([place])  02/15/2020   Bilateral groin pain 02/15/2020   Encounter for screening mammogram for malignant neoplasm of breast 02/15/2020   Palpitations 10/11/2019   Screening for colon cancer 10/11/2019   Encounter for hepatitis C screening test for low risk patient 07/06/2019   Type 2 diabetes mellitus without complication, without long-term current use of insulin ([place]) 08/16/2018   Inflammatory pain of left heel 05/17/2018   Primary generalized (osteo)arthritis 05/17/2018   Allergic rhinitis due to pollen 02/15/2018   Encounter for general adult medical examination with abnormal findings 01/28/2018   Uncontrolled type 2 diabetes mellitus with hyperglycemia ([place]) 01/28/2018   Acute non-recurrent sinusitis 01/28/2018   Ovarian failure 01/28/2018   Need for vaccination against Streptococcus pneumoniae using pneumococcal conjugate vaccine 13 01/28/2018   Dysuria 01/28/2018   Gastroesophageal reflux disease without esophagitis 10/19/2017   Essential hypertension 10/19/2017    Past Surgical History:  Procedure Laterality Date   ABDOMINAL HYSTERECTOMY     BREAST EXCISIONAL BIOPSY Left 2015   cyst removed   CATARACT EXTRACTION W/PHACO Right 03/27/2020   Procedure: CATARACT EXTRACTION PHACO AND INTRAOCULAR LENS PLACEMENT (IOC) RIGHT 5.03 00:50.6 9.9%;  Surgeon: Leandrew Koyanagi, MD;  Location: [place];  Service: Ophthalmology;  Laterality: Right;   CATARACT EXTRACTION W/PHACO Left 04/18/2020   Procedure: CATARACT EXTRACTION PHACO AND INTRAOCULAR LENS PLACEMENT (IOC) LEFT DIABETIC 4.56 00:51.7 8.8%;  Surgeon: Leandrew Koyanagi, MD;  Location: [facility];  Service: Ophthalmology;  Laterality: Left;   CHOLECYSTECTOMY     COLONOSCOPY N/A 09/25/2014   Procedure: COLONOSCOPY;  Surgeon: Lucilla Lame, MD;  Location: [place];  Service: Gastroenterology;  Laterality: N/A;  cecum time- 1610   COLONOSCOPY WITH PROPOFOL N/A 07/16/2020   Procedure: COLONOSCOPY WITH PROPOFOL;  Surgeon: Lin Landsman, MD;  Location: [facility] ENDOSCOPY;  Service: Endoscopy;  Laterality: N/A;   CYST REMOVAL NECK     ESOPHAGOGASTRODUODENOSCOPY N/A 09/25/2014   Procedure: ESOPHAGOGASTRODUODENOSCOPY (EGD);  Surgeon: Lucilla Lame, MD;  Location: [place];  Service: Gastroenterology;  Laterality: N/A;   ESOPHAGOGASTRODUODENOSCOPY (EGD) WITH PROPOFOL N/A 04/07/2018   Procedure: ESOPHAGOGASTRODUODENOSCOPY (EGD) WITH PROPOFOL;  Surgeon: Toledo, Benay Pike, MD;  Location: [facility] ENDOSCOPY;  Service: Gastroenterology;  Laterality: N/A;   POLYPECTOMY  09/25/2014   Procedure: POLYPECTOMY INTESTINAL;  Surgeon: Lucilla Lame, MD;  Location: [place];  Service: Gastroenterology;;    Prior to Admission medications   Medication Sig Start Date End Date Taking? Authorizing Provider  amLODipine (NORVASC) 2.5 MG tablet Take one tab po qhs for blood pressure 04/30/20  Yes Lavera Guise, MD  aspirin 81 MG tablet Take 81 mg by mouth daily.    Yes [provider]  Blood Glucose Monitoring Suppl ([place]) w/Device KIT Use as directed DX E11.65 06/01/20  Yes Lavera Guise, MD  canagliflozin [facility]) 300 MG TABS tablet Take 1 tablet (300 mg total) by mouth daily before breakfast. 05/29/20  Yes Lavera Guise, MD  Cholecalciferol (DIALYVITE VITAMIN D 5000) 125 MCG (5000 UT) capsule Take 5,000 Units by mouth daily.   Yes [provider]  cyanocobalamin 1000 MCG tablet Take 1,000 mcg by mouth daily.   Yes [provider]  dorzolamide-timolol (COSOPT) 22.3-6.8 MG/ML ophthalmic solution Place 1 drop into both eyes every evening.   Yes [provider]  furosemide (LASIX) 20 MG tablet TAKE 1 TABLET BY MOUTH DAILY 07/23/20  Yes Lavera Guise, MD  glucose blood [facility] VERIO) test strip Use as directed once daily DX E 11.65 06/01/20  Yes Lavera Guise, MD  latanoprost (XALATAN) 0.005 % ophthalmic solution Place 1 drop into both eyes at bedtime.  02/10/18  Yes [provider]  lisinopril (ZESTRIL) 40 MG tablet Take 1 tablet (40 mg total) by mouth daily. 05/29/20  Yes Lavera Guise, MD  OneTouch Delica Lancets 96E MISC Use as directed once a daily DX e11.65 06/01/20  Yes Lavera Guise, MD  pantoprazole (PROTONIX) 20 MG tablet TAKE 1 TABLET BY MOUTH DAILY 12/27/20  Yes Lavera Guise, MD  rosuvastatin (CRESTOR) 5 MG tablet TAKE 1 TABLET(5 MG) BY MOUTH DAILY Patient taking differently: Take 5 mg by mouth at bedtime. 01/24/21  Yes Lavera Guise, MD  ibuprofen (ADVIL) 400 MG tablet Take one tab po bid prn for pain Patient not taking: Reported on 04/05/2021 05/29/20   Lavera Guise, MD    Allergies Patient has no known allergies.  Family History  Problem Relation Age of Onset   Heart disease Sister    Diabetes Sister    Hypertension Sister    Heart disease Brother    Diabetes Brother    Hypertension Brother    Breast cancer Daughter 86   Colon cancer Mother    Stroke Mother    Lung cancer Father     Social History Social History   Tobacco Use   Smoking status: Former    Packs/day: 0.25    Years:  15.00    Pack years: 3.75    Types: Cigarettes    Quit date: 67    Years since quitting: 27.8   Smokeless tobacco: Never  Vaping Use   Vaping Use: Never used  Substance Use Topics   Alcohol use: Yes    Comment: 3-5 glasses wine/month   Drug use: Not Currently    Review of Systems  Constitutional: No fever/chills Eyes: No visual changes. ENT: No sore throat. Cardiovascular: Denies chest pain.  Positive for lightheadedness and syncope. Respiratory: Denies shortness of breath. Gastrointestinal: No abdominal pain.  No nausea, no vomiting.  No diarrhea.  No constipation. Genitourinary: Negative for dysuria. Musculoskeletal: Negative for back pain. Skin: Negative for rash. Neurological: Positive for headache, negative for focal weakness or numbness.  ____________________________________________   PHYSICAL EXAM:  VITAL SIGNS: ED  Triage Vitals [04/05/21 1102]  Enc Vitals Group     BP (!) 145/87     Pulse Rate 71     Resp 18     Temp 97.8 F (36.6 C)     Temp Source Oral     SpO2 97 %     Weight 209 lb (94.8 kg)     Height $Remov'5\' 7"'OzLGGc$  (1.702 m)     Head Circumference      Peak Flow      Pain Score 3     Pain Loc      Pain Edu?      Excl. in [place]?     Constitutional: Alert and oriented. Eyes: Conjunctivae are normal. Head: Atraumatic. Nose: No congestion/rhinnorhea. Mouth/Throat: Mucous membranes are moist. Neck: Normal ROM Cardiovascular: Normal rate, regular rhythm. Grossly normal heart sounds.  2+ radial pulses bilaterally. Respiratory: Normal respiratory effort.  No retractions. Lungs CTAB. Gastrointestinal: Soft and nontender. No distention. Genitourinary: deferred Musculoskeletal: No lower extremity tenderness nor edema. Neurologic:  Normal speech and language. No gross focal neurologic deficits are appreciated. Skin:  Skin is warm, dry and intact. No rash noted. Psychiatric: Mood and affect are normal. Speech and behavior are normal.  ____________________________________________   LABS (all labs ordered are listed, but only abnormal results are displayed)  Labs Reviewed  BASIC METABOLIC PANEL - Abnormal; Notable for the following components:      Result Value   Sodium 133 (*)    Glucose, Bld 114 (*)    All other components within normal limits  CBC - Abnormal; Notable for the following components:   RBC 5.24 (*)    MCH 25.8 (*)    All other components within normal limits  RESP PANEL BY RT-PCR (FLU A&B, COVID) [id number]  URINALYSIS, ROUTINE W REFLEX MICROSCOPIC  TROPONIN I (HIGH SENSITIVITY)   ____________________________________________  EKG  ED ECG REPORT I, Blake Divine, the attending physician, personally viewed and interpreted this ECG.   Date: 04/05/2021  EKG Time: 10:57  Rate: 66  Rhythm: normal sinus rhythm  Axis: Normal  Intervals:none  ST&T Change:  None   PROCEDURES  Procedure(s) performed (including Critical Care):  Procedures   ____________________________________________   INITIAL IMPRESSION / ASSESSMENT AND PLAN / ED COURSE      70 year old female with past medical history of hypertension, hyperlipidemia, diabetes, and GERD who presents to the ED following episode of lightheadedness with syncope last night, has been developing worsening headache since this morning.  She has no focal neurologic deficits on exam, no findings to suggest stroke at this time.  Given her headache with syncope, we will check CT head but suspicion for  SAH is low and no findings to suggest meningitis.  EKG shows no evidence of arrhythmia or ischemia, we will observe on cardiac monitor but labs thus far are unremarkable, troponin is pending.  We will also check orthostatic vital signs and hydrate with IV fluids.  Orthostatic vital signs are negative but patient is feeling better following IV fluid bolus.  No events noted on cardiac monitor and troponin within normal limits.  Low suspicion for cardiac etiology of patient's lightheadedness and syncope.  Given reassuring workup, she is appropriate for discharge home with PCP follow-up, was counseled to return to the ED for new worsening symptoms.  Patient agrees with plan.      ____________________________________________   FINAL CLINICAL IMPRESSION(S) / ED DIAGNOSES  Final diagnoses:  Syncope, unspecified syncope type  Lightheadedness  Acute nonintractable headache, unspecified headache type     ED Discharge Orders     None        Note:  This document was prepared using Dragon voice recognition software and may include unintentional dictation errors.    Blake Divine, MD 04/05/21 1401

## 2021-04-15 ENCOUNTER — Encounter: Payer: Self-pay | Admitting: Nurse Practitioner

## 2021-04-15 ENCOUNTER — Other Ambulatory Visit: Payer: Self-pay

## 2021-04-15 ENCOUNTER — Ambulatory Visit (INDEPENDENT_AMBULATORY_CARE_PROVIDER_SITE_OTHER): Payer: PPO | Admitting: Nurse Practitioner

## 2021-04-15 VITALS — BP 134/61 | HR 62 | Temp 98.5°F | Resp 16 | Ht 67.0 in | Wt 212.0 lb

## 2021-04-15 DIAGNOSIS — I10 Essential (primary) hypertension: Secondary | ICD-10-CM

## 2021-04-15 DIAGNOSIS — R55 Syncope and collapse: Secondary | ICD-10-CM

## 2021-04-15 DIAGNOSIS — E119 Type 2 diabetes mellitus without complications: Secondary | ICD-10-CM

## 2021-04-15 LAB — POCT GLYCOSYLATED HEMOGLOBIN (HGB A1C): Hemoglobin A1C: 6.7 % — AB (ref 4.0–5.6)

## 2021-04-15 NOTE — Progress Notes (Signed)
[facility] [place], [place] [address]  Internal MEDICINE  Office Visit Note  Patient Name: Holly Forbes  [id number]  [id number]  Date of Service: 04/15/2021  Chief Complaint  Patient presents with   Follow-up    Syncope and collapse   Diabetes   Gastroesophageal Reflux   Hypertension    HPI Holly Forbes presents for follow-up visit after going to the emergency department for syncope.  She was previously seen on November 18 for a syncopal episode at home for which EMS was called for but she did not go to the ER at this point.  At her previous office visit her blood pressure was normal her glucose was normal but she was still having a feeling of pressure in her head and dizziness.  For this she was sent to the emergency department from her office visit on November 18.  She reports that nothing specific was found at her visit to the emergency department.her CT scan of her head was normal.   Her A1c is due to be checked today and it is 6.7.  Her A1c in August was 6.8.  So today is a slight improvement from previous level.   Current Medication: Outpatient Encounter Medications as of 04/15/2021  Medication Sig   amLODipine (NORVASC) 2.5 MG tablet Take one tab po qhs for blood pressure   aspirin 81 MG tablet Take 81 mg by mouth daily.    Blood Glucose Monitoring Suppl (ONETOUCH VERIO FLEX SYSTEM) w/Device KIT Use as directed DX E11.65   canagliflozin (INVOKANA) 300 MG TABS tablet Take 1 tablet (300 mg total) by mouth daily before breakfast.   Cholecalciferol (DIALYVITE VITAMIN D 5000) 125 MCG (5000 UT) capsule Take 5,000 Units by mouth daily.   cyanocobalamin 1000 MCG tablet Take 1,000 mcg by mouth daily.   dorzolamide-timolol (COSOPT) 22.3-6.8 MG/ML ophthalmic solution Place 1 drop into both eyes every evening.   furosemide (LASIX) 20 MG tablet TAKE 1 TABLET BY MOUTH DAILY   glucose blood (ONETOUCH VERIO) test strip Use as directed once daily DX E 11.65    ibuprofen (ADVIL) 400 MG tablet Take one tab po bid prn for pain   latanoprost (XALATAN) 0.005 % ophthalmic solution Place 1 drop into both eyes at bedtime.    lisinopril (ZESTRIL) 40 MG tablet Take 1 tablet (40 mg total) by mouth daily.   OneTouch Delica Lancets 30Q MISC Use as directed once a daily DX e11.65   pantoprazole (PROTONIX) 20 MG tablet TAKE 1 TABLET BY MOUTH DAILY   rosuvastatin (CRESTOR) 5 MG tablet TAKE 1 TABLET(5 MG) BY MOUTH DAILY (Patient taking differently: Take 5 mg by mouth at bedtime.)   Facility-Administered Encounter Medications as of 04/15/2021  Medication   triamcinolone acetonide (KENALOG) 10 MG/ML injection 10 mg    Surgical History: Past Surgical History:  Procedure Laterality Date   ABDOMINAL HYSTERECTOMY     BREAST EXCISIONAL BIOPSY Left 2015   cyst removed   CATARACT EXTRACTION W/PHACO Right 03/27/2020   Procedure: CATARACT EXTRACTION PHACO AND INTRAOCULAR LENS PLACEMENT (IOC) RIGHT 5.03 00:50.6 9.9%;  Surgeon: Holly Koyanagi, MD;  Location: [place];  Service: Ophthalmology;  Laterality: Right;   CATARACT EXTRACTION W/PHACO Left 04/18/2020   Procedure: CATARACT EXTRACTION PHACO AND INTRAOCULAR LENS PLACEMENT (IOC) LEFT DIABETIC 4.56 00:51.7 8.8%;  Surgeon: Holly Koyanagi, MD;  Location: [facility];  Service: Ophthalmology;  Laterality: Left;   CHOLECYSTECTOMY     COLONOSCOPY N/A 09/25/2014   Procedure: COLONOSCOPY;  Surgeon: Holly Gula  Allen Norris, MD;  Location: [place];  Service: Gastroenterology;  Laterality: N/A;  cecum time- 1448   COLONOSCOPY WITH PROPOFOL N/A 07/16/2020   Procedure: COLONOSCOPY WITH PROPOFOL;  Surgeon: Holly Landsman, MD;  Location: [facility];  Service: Endoscopy;  Laterality: N/A;   CYST REMOVAL NECK     ESOPHAGOGASTRODUODENOSCOPY N/A 09/25/2014   Procedure: ESOPHAGOGASTRODUODENOSCOPY (EGD);  Surgeon: Holly Lame, MD;  Location: [place];  Service: Gastroenterology;  Laterality: N/A;    ESOPHAGOGASTRODUODENOSCOPY (EGD) WITH PROPOFOL N/A 04/07/2018   Procedure: ESOPHAGOGASTRODUODENOSCOPY (EGD) WITH PROPOFOL;  Surgeon: Toledo, Benay Pike, MD;  Location: [facility];  Service: Gastroenterology;  Laterality: N/A;   POLYPECTOMY  09/25/2014   Procedure: POLYPECTOMY INTESTINAL;  Surgeon: Holly Lame, MD;  Location: [place];  Service: Gastroenterology;;    Medical History: 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   Back pain    Left sciatica, low back pain   Cataract    Diabetes mellitus without complication (HCC)    GERD (gastroesophageal reflux disease)    Glaucoma    Hypertension    Vitamin D deficiency     Family History: Family History  Problem Relation Age of Onset   Heart disease Sister    Diabetes Sister    Hypertension Sister    Heart disease Brother    Diabetes Brother    Hypertension Brother    Breast cancer Daughter 7   Colon cancer Mother    Stroke Mother    Lung cancer Father     Social History   Socioeconomic History   Marital status: Married    Spouse name: Not on file   Number of children: Not on file   Years of education: Not on file   Highest education level: Not on file  Occupational History   Not on file  Tobacco Use   Smoking status: Former    Packs/day: 0.25    Years: 15.00    Pack years: 3.75    Types: Cigarettes    Quit date: 1995    Years since quitting: 28.0   Smokeless tobacco: Never  Vaping Use   Vaping Use: Never used  Substance and Sexual Activity   Alcohol use: Yes    Comment: 3-5 glasses wine/month   Drug use: Not Currently   Sexual activity: Not on file  Other Topics Concern   Not on file  Social History Narrative   Not on file   Social Determinants of Health   Financial Resource Strain: Low Risk    Difficulty of Paying Living Expenses: Not very hard  Food Insecurity: Not on file  Transportation Needs: Not on file  Physical  Activity: Not on file  Stress: Not on file  Social Connections: Not on file  Intimate Partner Violence: Not on file      Review of Systems  Constitutional:  Negative for chills, fatigue and unexpected weight change.  HENT:  Negative for congestion, rhinorrhea, sneezing and sore throat.   Eyes:  Negative for redness.  Respiratory:  Negative for cough, chest tightness and shortness of breath.   Cardiovascular:  Negative for chest pain and palpitations.  Gastrointestinal:  Negative for abdominal pain, constipation, diarrhea, nausea and vomiting.  Genitourinary:  Negative for dysuria and frequency.  Musculoskeletal:  Negative for arthralgias, back pain, joint swelling and neck pain.  Skin:  Negative for rash.  Neurological:  Positive for headaches. Negative for tremors  and numbness.  Hematological:  Negative for adenopathy. Does not bruise/bleed easily.  Psychiatric/Behavioral:  Negative for behavioral problems (Depression), sleep disturbance and suicidal ideas. The patient is not nervous/anxious.    Vital Signs: BP 134/61   Pulse 62   Temp 98.5 F (36.9 C)   Resp 16   Ht $R'5\' 7"'oL$  (1.702 m)   Wt 212 lb (96.2 kg)   LMP  (LMP Unknown)   SpO2 98%   BMI 33.20 kg/m    Physical Exam Vitals reviewed.  Constitutional:      General: She is not in acute distress.    Appearance: Normal appearance. She is obese. She is not ill-appearing.  HENT:     Head: Normocephalic and atraumatic.  Eyes:     Pupils: Pupils are equal, round, and reactive to light.  Cardiovascular:     Rate and Rhythm: Normal rate and regular rhythm.  Pulmonary:     Effort: Pulmonary effort is normal. No respiratory distress.  Neurological:     Mental Status: She is alert and oriented to person, place, and time.     Cranial Nerves: No cranial nerve deficit.     Coordination: Coordination normal.     Gait: Gait normal.  Psychiatric:        Mood and Affect: Mood normal.        Behavior: Behavior normal.        Assessment/Plan: 1. Type 2 diabetes mellitus without complication, without long-term current use of insulin ([place]) Patient encouraged to check her glucose level anytime she is feeling dizzy or woozy.  - POCT HgB A1C  2. Essential hypertension When patient is having dizzy spells, patient is encouraged to check her BP as well.   3. Syncope, unspecified syncope type No etiology was found in the ER, patient CT scan was normal.    General Counseling: Kate Sable understanding of the findings of todays visit and agrees with plan of treatment. I have discussed any further diagnostic evaluation that may be needed or ordered today. We also reviewed her medications today. she has been encouraged to call the office with any questions or concerns that should arise related to todays visit.    Orders Placed This Encounter  Procedures   POCT HgB A1C    No orders of the defined types were placed in this encounter.    Return in about 3 months (around 07/16/2021) for F/U, Recheck A1C, Maven Varelas PCP and cancel 05/02/21 appt. .   Total time spent:30 Minutes Time spent includes review of chart, medications, test results, and follow up plan with the patient.   [place] Controlled Substance Database was reviewed by me.  This patient was seen by Jonetta Osgood, FNP-C in collaboration with Dr. Clayborn Bigness as a part of collaborative care agreement.   Zanaya Baize R. Valetta Fuller, MSN, FNP-C Internal medicine

## 2021-04-23 ENCOUNTER — Ambulatory Visit: Payer: Self-pay | Admitting: Student-PharmD

## 2021-04-23 DIAGNOSIS — E119 Type 2 diabetes mellitus without complications: Secondary | ICD-10-CM

## 2021-04-23 DIAGNOSIS — I10 Essential (primary) hypertension: Secondary | ICD-10-CM

## 2021-04-23 NOTE — Progress Notes (Signed)
Diabetes (DM) Review Call   Holly Forbes, DAWN [id number] 86 years, Female  DOB: 1950/10/29  M: [phone]  Diabetes (DM) Review  Completed by Charlann Lange on 04/23/2021  Chart Review Is the patient enrolled in RPM with the glucometer?: No A1C Reading #1 (last): 6.7 on: 04/15/2021 A1C Reading #2: 6.8 on: 12/19/2020 When was patient's last eye exam?: 01/10/2021 The patient's glycemic control has: Remained Stable What recent interventions/DTPs have been made by any provider to improve the patient's conditions in the last 3 months?:  04/05/21 Jonetta Osgood, NP. For type 2 diabetes mellitus. No medication changes.  Any recent hospitalizations or ED visits since last visit with CPP?: Yes Brief Summary (including Medication and/or Diagnosis changes)::  04/05/21 [place] (2 Hours) Blake Divine, MD For Syncope. No medication changes. PER NOTE: EKG and labs completed.   Is the patient currently on a STATIN medication?: Yes Is the patient currently on ACE/ARB medication?: Yes  Adherence Review Adherence rates for STAR metric medications: Rosuvastatin 5 mg 02/28/21 90 DS Lisinopril 40 mg 02/09/21 90 DS Invokana 300 mg 02/26/21 90 DS  Adherence rates for medications indicated for disease state being reviewed:  Invokana 300 mg 02/26/21 90 DS Does the patient have >5 day gap between last estimated fill dates for any of the above medications?: No  Disease State Questions Able to connect with the Patient?: Yes Are you currently checking your blood sugars?: Yes How often are you checking your blood sugar?: daily Recent fasting blood sugars: 110-120 range Is the patient having any lows <70?: No Is the patient having any fasting blood sugars above >170?: No Is the patient checking their feet daily/regularly?: Yes Are there any cuts, swelling, blisters, redness, or any signs of infections?: No When was your last dentist appointment? (6  Month recommendation): Last dentist appointment 2021 What diet changes have you made to improve your Blood Sugar level?: limiting/monitoring carb intake What exercise are you doing to improve your Blood Sugar level?: no formal exercise  Pharmacist Review  Adherence gaps identified?: No Drug Therapy Problems identified?: No Assessment: Controlled  8 minutes spent in review, coordination, and documentation.  Reviewed by: Alena Bills, PharmD Clinical Pharmacist [phone]

## 2021-05-02 ENCOUNTER — Ambulatory Visit: Payer: PPO | Admitting: Nurse Practitioner

## 2021-05-17 DIAGNOSIS — I10 Essential (primary) hypertension: Secondary | ICD-10-CM | POA: Diagnosis not present

## 2021-05-17 DIAGNOSIS — E1165 Type 2 diabetes mellitus with hyperglycemia: Secondary | ICD-10-CM | POA: Diagnosis not present

## 2021-05-22 ENCOUNTER — Other Ambulatory Visit: Payer: Self-pay | Admitting: Internal Medicine

## 2021-05-23 ENCOUNTER — Other Ambulatory Visit: Payer: Self-pay | Admitting: Internal Medicine

## 2021-05-26 ENCOUNTER — Other Ambulatory Visit: Payer: Self-pay | Admitting: Internal Medicine

## 2021-06-24 ENCOUNTER — Other Ambulatory Visit: Payer: Self-pay | Admitting: Internal Medicine

## 2021-06-24 DIAGNOSIS — K219 Gastro-esophageal reflux disease without esophagitis: Secondary | ICD-10-CM

## 2021-07-04 ENCOUNTER — Telehealth: Payer: Self-pay

## 2021-07-04 NOTE — Telephone Encounter (Signed)
Per [facility], patient refused CPAP machine.

## 2021-07-09 DIAGNOSIS — H401131 Primary open-angle glaucoma, bilateral, mild stage: Secondary | ICD-10-CM | POA: Diagnosis not present

## 2021-07-15 ENCOUNTER — Ambulatory Visit (INDEPENDENT_AMBULATORY_CARE_PROVIDER_SITE_OTHER): Payer: PPO | Admitting: Internal Medicine

## 2021-07-15 ENCOUNTER — Other Ambulatory Visit: Payer: Self-pay

## 2021-07-15 ENCOUNTER — Encounter: Payer: Self-pay | Admitting: Nurse Practitioner

## 2021-07-15 VITALS — BP 150/90 | HR 59 | Temp 98.0°F | Resp 16 | Ht 67.0 in | Wt 219.0 lb

## 2021-07-15 DIAGNOSIS — E119 Type 2 diabetes mellitus without complications: Secondary | ICD-10-CM | POA: Diagnosis not present

## 2021-07-15 DIAGNOSIS — Z6834 Body mass index (BMI) 34.0-34.9, adult: Secondary | ICD-10-CM | POA: Diagnosis not present

## 2021-07-15 DIAGNOSIS — I10 Essential (primary) hypertension: Secondary | ICD-10-CM

## 2021-07-15 LAB — POCT GLYCOSYLATED HEMOGLOBIN (HGB A1C): Hemoglobin A1C: 6.7 % — AB (ref 4.0–5.6)

## 2021-07-15 MED ORDER — AMLODIPINE BESYLATE 2.5 MG PO TABS: ORAL_TABLET | ORAL | 3 refills | Status: DC

## 2021-07-15 MED ORDER — CANAGLIFLOZIN 300 MG PO TABS: 300.0000 mg | ORAL_TABLET | Freq: Every day | ORAL | 3 refills | Status: DC

## 2021-07-15 NOTE — Progress Notes (Signed)
[facility] [place], [place] [address]  Internal MEDICINE  Office Visit Note  Patient Name: Holly Forbes  [id number]  [id number]  Date of Service: 07/16/2021  Chief Complaint  Patient presents with   Follow-up   Hypertension   Diabetes   Ear Pain    left    HPI Pt is here for routine follow up She has been out of her Invokana and Norvasc. She is under stress due to her sister being sick   She is going to better take care of her   Current Medication: Outpatient Encounter Medications as of 07/15/2021  Medication Sig   aspirin 81 MG tablet Take 81 mg by mouth daily.    Blood Glucose Monitoring Suppl (ONETOUCH VERIO FLEX SYSTEM) w/Device KIT Use as directed DX E11.65   Cholecalciferol (DIALYVITE VITAMIN D 5000) 125 MCG (5000 UT) capsule Take 5,000 Units by mouth daily.   cyanocobalamin 1000 MCG tablet Take 1,000 mcg by mouth daily.   dorzolamide-timolol (COSOPT) 22.3-6.8 MG/ML ophthalmic solution Place 1 drop into both eyes every evening.   furosemide (LASIX) 20 MG tablet TAKE 1 TABLET BY MOUTH DAILY   glucose blood (ONETOUCH VERIO) test strip Use as directed once daily DX E 11.65   ibuprofen (ADVIL) 400 MG tablet Take one tab po bid prn for pain   latanoprost (XALATAN) 0.005 % ophthalmic solution Place 1 drop into both eyes at bedtime.    lisinopril (ZESTRIL) 40 MG tablet Take 1 tablet (40 mg total) by mouth daily.   OneTouch Delica Lancets 51W MISC Use as directed once a daily DX e11.65   pantoprazole (PROTONIX) 20 MG tablet TAKE 1 TABLET BY MOUTH DAILY   rosuvastatin (CRESTOR) 5 MG tablet TAKE 1 TABLET(5 MG) BY MOUTH DAILY (Patient taking differently: Take 5 mg by mouth at bedtime.)   [DISCONTINUED] amLODipine (NORVASC) 2.5 MG tablet TAKE 1 TABLET BY MOUTH EVERY NIGHT AT BEDTIME FOR BLOOD PRESSURE   [DISCONTINUED] INVOKANA 300 MG TABS tablet TAKE 1 TABLET BY MOUTH DAILY BEFORE BREAKFAST   amLODipine (NORVASC) 2.5 MG tablet TAKE 1 TABLET BY  MOUTH EVERY NIGHT AT BEDTIME FOR BLOOD PRESSURE   canagliflozin (INVOKANA) 300 MG TABS tablet Take 1 tablet (300 mg total) by mouth daily before breakfast.   Facility-Administered Encounter Medications as of 07/15/2021  Medication   triamcinolone acetonide (KENALOG) 10 MG/ML injection 10 mg    Surgical History: Past Surgical History:  Procedure Laterality Date   ABDOMINAL HYSTERECTOMY     BREAST EXCISIONAL BIOPSY Left 2015   cyst removed   CATARACT EXTRACTION W/PHACO Right 03/27/2020   Procedure: CATARACT EXTRACTION PHACO AND INTRAOCULAR LENS PLACEMENT (IOC) RIGHT 5.03 00:50.6 9.9%;  Surgeon: Leandrew Koyanagi, MD;  Location: [place];  Service: Ophthalmology;  Laterality: Right;   CATARACT EXTRACTION W/PHACO Left 04/18/2020   Procedure: CATARACT EXTRACTION PHACO AND INTRAOCULAR LENS PLACEMENT (IOC) LEFT DIABETIC 4.56 00:51.7 8.8%;  Surgeon: Leandrew Koyanagi, MD;  Location: [facility];  Service: Ophthalmology;  Laterality: Left;   CHOLECYSTECTOMY     COLONOSCOPY N/A 09/25/2014   Procedure: COLONOSCOPY;  Surgeon: Lucilla Lame, MD;  Location: [place];  Service: Gastroenterology;  Laterality: N/A;  cecum time- 2585   COLONOSCOPY WITH PROPOFOL N/A 07/16/2020   Procedure: COLONOSCOPY WITH PROPOFOL;  Surgeon: Lin Landsman, MD;  Location: [facility];  Service: Endoscopy;  Laterality: N/A;   CYST REMOVAL NECK     ESOPHAGOGASTRODUODENOSCOPY N/A 09/25/2014   Procedure: ESOPHAGOGASTRODUODENOSCOPY (EGD);  Surgeon: Lucilla Lame, MD;  Location: [place];  Service: Gastroenterology;  Laterality: N/A;   ESOPHAGOGASTRODUODENOSCOPY (EGD) WITH PROPOFOL N/A 04/07/2018   Procedure: ESOPHAGOGASTRODUODENOSCOPY (EGD) WITH PROPOFOL;  Surgeon: Toledo, Benay Pike, MD;  Location: [facility];  Service: Gastroenterology;  Laterality: N/A;   POLYPECTOMY  09/25/2014   Procedure: POLYPECTOMY INTESTINAL;  Surgeon: Lucilla Lame, MD;  Location: [place];  Service:  Gastroenterology;;    Medical History: 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   Back pain    Left sciatica, low back pain   Cataract    Diabetes mellitus without complication (HCC)    GERD (gastroesophageal reflux disease)    Glaucoma    Hypertension    Vitamin D deficiency     Family History: Family History  Problem Relation Age of Onset   Heart disease Sister    Diabetes Sister    Hypertension Sister    Heart disease Brother    Diabetes Brother    Hypertension Brother    Breast cancer Daughter 63   Colon cancer Mother    Stroke Mother    Lung cancer Father     Social History   Socioeconomic History   Marital status: Married    Spouse name: Not on file   Number of children: Not on file   Years of education: Not on file   Highest education level: Not on file  Occupational History   Not on file  Tobacco Use   Smoking status: Former    Packs/day: 0.25    Years: 15.00    Pack years: 3.75    Types: Cigarettes    Quit date: 1995    Years since quitting: 28.1   Smokeless tobacco: Never  Vaping Use   Vaping Use: Never used  Substance and Sexual Activity   Alcohol use: Yes    Comment: 3-5 glasses wine/month   Drug use: Not Currently   Sexual activity: Not on file  Other Topics Concern   Not on file  Social History Narrative   Not on file   Social Determinants of Health   Financial Resource Strain: Low Risk    Difficulty of Paying Living Expenses: Not very hard  Food Insecurity: Not on file  Transportation Needs: Not on file  Physical Activity: Not on file  Stress: Not on file  Social Connections: Not on file  Intimate Partner Violence: Not on file      Review of Systems  Constitutional:  Negative for chills, fatigue and unexpected weight change.  HENT:  Negative for congestion, postnasal drip, rhinorrhea, sneezing and sore throat.   Eyes:  Negative for redness.  Respiratory:  Negative  for cough, chest tightness and shortness of breath.   Cardiovascular:  Negative for chest pain and palpitations.  Gastrointestinal:  Negative for abdominal pain, constipation, diarrhea, nausea and vomiting.  Genitourinary:  Negative for dysuria and frequency.  Musculoskeletal:  Negative for arthralgias, back pain, joint swelling and neck pain.  Skin:  Negative for rash.  Neurological: Negative.  Negative for tremors and numbness.  Hematological:  Negative for adenopathy. Does not bruise/bleed easily.  Psychiatric/Behavioral:  Negative for behavioral problems (Depression), sleep disturbance and suicidal ideas. The patient is not nervous/anxious.    Vital Signs: BP (!) 150/90    Pulse (!) 59    Temp 98 F (36.7 C)    Resp 16    Ht $R'5\' 7"'IX$  (1.702 m)    Wt  219 lb (99.3 kg)    LMP  (LMP Unknown)    SpO2 99%    BMI 34.30 kg/m    Physical Exam Constitutional:      Appearance: Normal appearance.  HENT:     Head: Normocephalic and atraumatic.     Nose: Nose normal.     Mouth/Throat:     Mouth: Mucous membranes are moist.     Pharynx: No posterior oropharyngeal erythema.  Eyes:     Extraocular Movements: Extraocular movements intact.     Pupils: Pupils are equal, round, and reactive to light.  Cardiovascular:     Pulses: Normal pulses.     Heart sounds: Normal heart sounds.  Pulmonary:     Effort: Pulmonary effort is normal.     Breath sounds: Normal breath sounds.  Neurological:     General: No focal deficit present.     Mental Status: She is alert.  Psychiatric:        Mood and Affect: Mood normal.        Behavior: Behavior normal.       Assessment/Plan: 1. Type 2 diabetes mellitus without complication, without long-term current use of insulin (HCC) Pt is to continue all her medications. Has been out of Invokana, she will start back  - POCT HgB A1C - canagliflozin (INVOKANA) 300 MG TABS tablet; Take 1 tablet (300 mg total) by mouth daily before breakfast.  Dispense: 90 tablet;  Refill: 3  2. Essential hypertension Blood pressure Is elevated.since she is not taking her Norvasc. Continue Lisinopril - amLODipine (NORVASC) 2.5 MG tablet; TAKE 1 TABLET BY MOUTH EVERY NIGHT AT BEDTIME FOR BLOOD PRESSURE  Dispense: 90 tablet; Refill: 3  3. BMI 34.0-34.9,adult Obesity Counseling: Risk Assessment: An assessment of behavioral risk factors was made today and includes lack of exercise sedentary lifestyle, lack of portion control and poor dietary habits.  Risk Modification Advice: She was counseled on portion control guidelines. Restricting daily caloric intake to 1800 ADA. The detrimental long term effects of obesity on her health and ongoing poor compliance was also discussed with the patient.     General Counseling: paula busenbark understanding of the findings of todays visit and agrees with plan of treatment. I have discussed any further diagnostic evaluation that may be needed or ordered today. We also reviewed her medications today. she has been encouraged to call the office with any questions or concerns that should arise related to todays visit.    Orders Placed This Encounter  Procedures   POCT HgB A1C    Meds ordered this encounter  Medications   canagliflozin (INVOKANA) 300 MG TABS tablet    Sig: Take 1 tablet (300 mg total) by mouth daily before breakfast.    Dispense:  90 tablet    Refill:  3   amLODipine (NORVASC) 2.5 MG tablet    Sig: TAKE 1 TABLET BY MOUTH EVERY NIGHT AT BEDTIME FOR BLOOD PRESSURE    Dispense:  90 tablet    Refill:  3    Total time spent:35 Minutes Time spent includes review of chart, medications, test results, and follow up plan with the patient.   [place] Controlled Substance Database was reviewed by me.   Dr Lavera Guise Internal medicine

## 2021-07-18 ENCOUNTER — Telehealth: Payer: Self-pay

## 2021-07-18 NOTE — Telephone Encounter (Signed)
PA for INVOKANA 300 mg sent 07/18/21 at 1:27 pm ?

## 2021-07-19 ENCOUNTER — Telehealth: Payer: Self-pay

## 2021-07-19 DIAGNOSIS — E119 Type 2 diabetes mellitus without complications: Secondary | ICD-10-CM

## 2021-07-19 MED ORDER — CANAGLIFLOZIN 300 MG PO TABS: 300.0000 mg | ORAL_TABLET | Freq: Every day | ORAL | 3 refills | Status: DC

## 2021-07-19 NOTE — Telephone Encounter (Signed)
PA for [facility] 300 mg approved 07/18/21 to 05/18/22 sent new rx to pharmacy and pt informed ?

## 2021-08-10 ENCOUNTER — Other Ambulatory Visit: Payer: Self-pay | Admitting: Internal Medicine

## 2021-08-29 ENCOUNTER — Other Ambulatory Visit: Payer: Self-pay | Admitting: Internal Medicine

## 2021-08-29 DIAGNOSIS — E1169 Type 2 diabetes mellitus with other specified complication: Secondary | ICD-10-CM

## 2021-09-04 ENCOUNTER — Telehealth: Payer: PPO

## 2021-10-07 ENCOUNTER — Ambulatory Visit: Payer: PPO | Admitting: Nurse Practitioner

## 2021-10-15 ENCOUNTER — Ambulatory Visit: Payer: PPO | Admitting: Nurse Practitioner

## 2021-10-17 ENCOUNTER — Ambulatory Visit (INDEPENDENT_AMBULATORY_CARE_PROVIDER_SITE_OTHER): Payer: PPO | Admitting: Nurse Practitioner

## 2021-10-17 ENCOUNTER — Encounter: Payer: Self-pay | Admitting: Nurse Practitioner

## 2021-10-17 VITALS — BP 114/74 | HR 83 | Temp 98.2°F | Resp 16 | Ht 67.0 in | Wt 212.4 lb

## 2021-10-17 DIAGNOSIS — E782 Mixed hyperlipidemia: Secondary | ICD-10-CM

## 2021-10-17 DIAGNOSIS — I10 Essential (primary) hypertension: Secondary | ICD-10-CM | POA: Diagnosis not present

## 2021-10-17 DIAGNOSIS — E119 Type 2 diabetes mellitus without complications: Secondary | ICD-10-CM

## 2021-10-17 DIAGNOSIS — E538 Deficiency of other specified B group vitamins: Secondary | ICD-10-CM

## 2021-10-17 DIAGNOSIS — E559 Vitamin D deficiency, unspecified: Secondary | ICD-10-CM | POA: Diagnosis not present

## 2021-10-17 DIAGNOSIS — I5189 Other ill-defined heart diseases: Secondary | ICD-10-CM | POA: Diagnosis not present

## 2021-10-17 DIAGNOSIS — I1A0 Resistant hypertension: Secondary | ICD-10-CM

## 2021-10-17 LAB — POCT GLYCOSYLATED HEMOGLOBIN (HGB A1C): Hemoglobin A1C: 6.9 % — AB (ref 4.0–5.6)

## 2021-10-17 NOTE — Progress Notes (Signed)
[facility] [place], [place] [address]  Internal MEDICINE  Office Visit Note  Patient Name: Holly Forbes  [id number]  [id number]  Date of Service: 10/17/2021  Chief Complaint  Patient presents with   Diabetes    HPI Holly Forbes presents for follow-up visit for diabetes, weight loss.  Her A1c is 6.9 today which is up from her previous A1c of 6.7 in february this year.  She has lost 7 pounds since her previous office visit.  She confirms that she has been taking care of her husband has been in the hospital and she has been spending time with him while he has been in the hospital lately so she has been eating vending machine and hospital food. Her annual physical exam is coming up in November and she is due for routine labs. Blood pressure is within normal limits, the rest of her vital signs are also within normal limits. She has no other questions or concerns and denies any new or worsening pain.    Current Medication: Outpatient Encounter Medications as of 10/17/2021  Medication Sig   amLODipine (NORVASC) 2.5 MG tablet TAKE 1 TABLET BY MOUTH EVERY NIGHT AT BEDTIME FOR BLOOD PRESSURE   aspirin 81 MG tablet Take 81 mg by mouth daily.    Blood Glucose Monitoring Suppl (ONETOUCH VERIO FLEX SYSTEM) w/Device KIT Use as directed DX E11.65   canagliflozin (INVOKANA) 300 MG TABS tablet Take 1 tablet (300 mg total) by mouth daily before breakfast.   Cholecalciferol (DIALYVITE VITAMIN D 5000) 125 MCG (5000 UT) capsule Take 5,000 Units by mouth daily.   cyanocobalamin 1000 MCG tablet Take 1,000 mcg by mouth daily.   dorzolamide-timolol (COSOPT) 22.3-6.8 MG/ML ophthalmic solution Place 1 drop into both eyes every evening.   furosemide (LASIX) 20 MG tablet TAKE 1 TABLET BY MOUTH DAILY   glucose blood (ONETOUCH VERIO) test strip USE TO TEST ONCE DAILY AS DIRECTED   ibuprofen (ADVIL) 400 MG tablet Take one tab po bid prn for pain   Lancets (ONETOUCH DELICA PLUS  [id number]) MISC USE AS DIRECTED ONCE A DAY   latanoprost (XALATAN) 0.005 % ophthalmic solution Place 1 drop into both eyes at bedtime.    lisinopril (ZESTRIL) 40 MG tablet TAKE 1 TABLET(40 MG) BY MOUTH DAILY   pantoprazole (PROTONIX) 20 MG tablet TAKE 1 TABLET BY MOUTH DAILY   rosuvastatin (CRESTOR) 5 MG tablet TAKE 1 TABLET BY MOUTH DAILY   Facility-Administered Encounter Medications as of 10/17/2021  Medication   triamcinolone acetonide (KENALOG) 10 MG/ML injection 10 mg    Surgical History: Past Surgical History:  Procedure Laterality Date   ABDOMINAL HYSTERECTOMY     BREAST EXCISIONAL BIOPSY Left 2015   cyst removed   CATARACT EXTRACTION W/PHACO Right 03/27/2020   Procedure: CATARACT EXTRACTION PHACO AND INTRAOCULAR LENS PLACEMENT (IOC) RIGHT 5.03 00:50.6 9.9%;  Surgeon: Leandrew Koyanagi, MD;  Location: [place];  Service: Ophthalmology;  Laterality: Right;   CATARACT EXTRACTION W/PHACO Left 04/18/2020   Procedure: CATARACT EXTRACTION PHACO AND INTRAOCULAR LENS PLACEMENT (IOC) LEFT DIABETIC 4.56 00:51.7 8.8%;  Surgeon: Leandrew Koyanagi, MD;  Location: [facility];  Service: Ophthalmology;  Laterality: Left;   CHOLECYSTECTOMY     COLONOSCOPY N/A 09/25/2014   Procedure: COLONOSCOPY;  Surgeon: Lucilla Lame, MD;  Location: [place];  Service: Gastroenterology;  Laterality: N/A;  cecum time- 4010   COLONOSCOPY WITH PROPOFOL N/A 07/16/2020   Procedure: COLONOSCOPY WITH PROPOFOL;  Surgeon: Lin Landsman, MD;  Location: [place];  Service: Endoscopy;  Laterality: N/A;   CYST REMOVAL NECK     ESOPHAGOGASTRODUODENOSCOPY N/A 09/25/2014   Procedure: ESOPHAGOGASTRODUODENOSCOPY (EGD);  Surgeon: Lucilla Lame, MD;  Location: [place];  Service: Gastroenterology;  Laterality: N/A;   ESOPHAGOGASTRODUODENOSCOPY (EGD) WITH PROPOFOL N/A 04/07/2018   Procedure: ESOPHAGOGASTRODUODENOSCOPY (EGD) WITH PROPOFOL;  Surgeon: Toledo, Benay Pike, MD;  Location: [facility]  ENDOSCOPY;  Service: Gastroenterology;  Laterality: N/A;   POLYPECTOMY  09/25/2014   Procedure: POLYPECTOMY INTESTINAL;  Surgeon: Lucilla Lame, MD;  Location: [place];  Service: Gastroenterology;;    Medical History: 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   Back pain    Left sciatica, low back pain   Cataract    Diabetes mellitus without complication (HCC)    GERD (gastroesophageal reflux disease)    Glaucoma    Hypertension    Vitamin D deficiency     Family History: Family History  Problem Relation Age of Onset   Heart disease Sister    Diabetes Sister    Hypertension Sister    Heart disease Brother    Diabetes Brother    Hypertension Brother    Breast cancer Daughter 20   Colon cancer Mother    Stroke Mother    Lung cancer Father     Social History   Socioeconomic History   Marital status: Married    Spouse name: Not on file   Number of children: Not on file   Years of education: Not on file   Highest education level: Not on file  Occupational History   Not on file  Tobacco Use   Smoking status: Former    Packs/day: 0.25    Years: 15.00    Pack years: 3.75    Types: Cigarettes    Quit date: 1995    Years since quitting: 28.4   Smokeless tobacco: Never  Vaping Use   Vaping Use: Never used  Substance and Sexual Activity   Alcohol use: Yes    Comment: 3-5 glasses wine/month   Drug use: Not Currently   Sexual activity: Not on file  Other Topics Concern   Not on file  Social History Narrative   Not on file   Social Determinants of Health   Financial Resource Strain: Low Risk    Difficulty of Paying Living Expenses: Not very hard  Food Insecurity: Not on file  Transportation Needs: Not on file  Physical Activity: Not on file  Stress: Not on file  Social Connections: Not on file  Intimate Partner Violence: Not on file      Review of Systems  Constitutional:  Negative for  chills, fatigue and unexpected weight change.  HENT:  Negative for congestion, postnasal drip, rhinorrhea, sneezing and sore throat.   Eyes:  Negative for redness.  Respiratory:  Negative for cough, chest tightness and shortness of breath.   Cardiovascular:  Negative for chest pain and palpitations.  Gastrointestinal:  Negative for abdominal pain, constipation, diarrhea, nausea and vomiting.  Genitourinary:  Negative for dysuria and frequency.  Musculoskeletal:  Negative for arthralgias, back pain, joint swelling and neck pain.  Skin:  Negative for rash.  Neurological: Negative.  Negative for tremors and numbness.  Hematological:  Negative for adenopathy. Does not bruise/bleed easily.  Psychiatric/Behavioral:  Negative for behavioral problems (Depression), sleep disturbance and suicidal ideas. The patient is not nervous/anxious.     Vital Signs: BP 114/74   Pulse  83   Temp 98.2 F (36.8 C)   Resp 16   Ht $R'5\' 7"'Dl$  (1.702 m)   Wt 212 lb 6.4 oz (96.3 kg)   LMP  (LMP Unknown)   SpO2 99%   BMI 33.27 kg/m    Physical Exam Constitutional:      Appearance: Normal appearance.  HENT:     Head: Normocephalic and atraumatic.     Nose: Nose normal.     Mouth/Throat:     Mouth: Mucous membranes are moist.     Pharynx: No posterior oropharyngeal erythema.  Eyes:     Extraocular Movements: Extraocular movements intact.     Pupils: Pupils are equal, round, and reactive to light.  Cardiovascular:     Pulses: Normal pulses.     Heart sounds: Normal heart sounds.  Pulmonary:     Effort: Pulmonary effort is normal.     Breath sounds: Normal breath sounds.  Neurological:     General: No focal deficit present.     Mental Status: She is alert.  Psychiatric:        Mood and Affect: Mood normal.        Behavior: Behavior normal.        Assessment/Plan: 1. Type 2 diabetes mellitus without complication, without long-term current use of insulin (HCC) A1c is 6.9 which is slightly elevated  from previous level in February, routine labs ordered for her annual physical exam in September - POCT glycosylated hemoglobin (Hb A1C) - CBC with Differential/Platelet - CMP14+EGFR - Lipid Profile - Hgb A1C w/o eAG  2. Mixed hyperlipidemia Continue rosuvastatin as prescribed, routine labs ordered - CBC with Differential/Platelet - Lipid Profile - TSH + free T4  3. Vitamin D deficiency Routine labs ordered - CBC with Differential/Platelet - Lipid Profile - Vitamin D (25 hydroxy)  4. Resistant hypertension Routine labs ordered, well-controlled with current medications. - CBC with Differential/Platelet - Lipid Profile  5. Diastolic dysfunction Routine labs ordered - CBC with Differential/Platelet - Lipid Profile  6. B12 deficiency Routine labs ordered - CBC with Differential/Platelet - Lipid Profile - B12 and Folate Panel   General Counseling: Holly Forbes verbalizes understanding of the findings of todays visit and agrees with plan of treatment. I have discussed any further diagnostic evaluation that may be needed or ordered today. We also reviewed her medications today. she has been encouraged to call the office with any questions or concerns that should arise related to todays visit.    Orders Placed This Encounter  Procedures   CBC with Differential/Platelet   CMP14+EGFR   Lipid Profile   Vitamin D (25 hydroxy)   TSH + free T4   B12 and Folate Panel   Hgb A1C w/o eAG   POCT glycosylated hemoglobin (Hb A1C)    No orders of the defined types were placed in this encounter.   Return in 4 months (on 02/07/2022) for previously scheduled, CPE, [place] PCP.   Total time spent:30 Minutes Time spent includes review of chart, medications, test results, and follow up plan with the patient.   [place] Controlled Substance Database was reviewed by me.  This patient was seen by Jonetta Osgood, FNP-C in collaboration with Dr. Clayborn Bigness as a part of collaborative care  agreement.   Holly Forbes R. Valetta Fuller, MSN, FNP-C Internal medicine

## 2021-12-04 ENCOUNTER — Encounter: Payer: Self-pay | Admitting: Nurse Practitioner

## 2021-12-23 ENCOUNTER — Other Ambulatory Visit: Payer: Self-pay | Admitting: Internal Medicine

## 2021-12-23 DIAGNOSIS — K219 Gastro-esophageal reflux disease without esophagitis: Secondary | ICD-10-CM

## 2022-01-31 ENCOUNTER — Other Ambulatory Visit: Payer: Self-pay | Admitting: Nurse Practitioner

## 2022-01-31 DIAGNOSIS — E782 Mixed hyperlipidemia: Secondary | ICD-10-CM | POA: Diagnosis not present

## 2022-01-31 DIAGNOSIS — E119 Type 2 diabetes mellitus without complications: Secondary | ICD-10-CM | POA: Diagnosis not present

## 2022-01-31 DIAGNOSIS — I10 Essential (primary) hypertension: Secondary | ICD-10-CM | POA: Diagnosis not present

## 2022-01-31 DIAGNOSIS — I5189 Other ill-defined heart diseases: Secondary | ICD-10-CM | POA: Diagnosis not present

## 2022-01-31 DIAGNOSIS — E559 Vitamin D deficiency, unspecified: Secondary | ICD-10-CM | POA: Diagnosis not present

## 2022-01-31 DIAGNOSIS — E538 Deficiency of other specified B group vitamins: Secondary | ICD-10-CM | POA: Diagnosis not present

## 2022-02-01 LAB — B12 AND FOLATE PANEL
Folate: 9.9 ng/mL (ref >3.0)
Vitamin B-12: 435 pg/mL (ref 232–1245)

## 2022-02-01 LAB — CMP14+EGFR
ALT: 13 IU/L (ref 0–32)
AST: 14 IU/L (ref 0–40)
Albumin/Globulin Ratio: 1.7 (ref 1.2–2.2)
Albumin: 4.2 g/dL (ref 3.9–4.9)
Alkaline Phosphatase: 109 IU/L (ref 44–121)
BUN/Creatinine Ratio: 14 (ref 12–28)
BUN: 11 mg/dL (ref 8–27)
Bilirubin Total: 0.6 mg/dL (ref 0.0–1.2)
CO2: 24 mmol/L (ref 20–29)
Calcium: 8.9 mg/dL (ref 8.7–10.3)
Chloride: 106 mmol/L (ref 96–106)
Creatinine, Ser: 0.77 mg/dL (ref 0.57–1.00)
Globulin, Total: 2.5 g/dL (ref 1.5–4.5)
Glucose: 131 mg/dL — ABNORMAL HIGH (ref 70–99)
Potassium: 4.1 mmol/L (ref 3.5–5.2)
Sodium: 142 mmol/L (ref 134–144)
Total Protein: 6.7 g/dL (ref 6.0–8.5)
eGFR: 83 mL/min/{1.73_m2} (ref >59)

## 2022-02-01 LAB — LIPID PANEL
Chol/HDL Ratio: 2.6 ratio (ref 0.0–4.4)
Cholesterol, Total: 144 mg/dL (ref 100–199)
HDL: 56 mg/dL (ref >39)
LDL Chol Calc (NIH): 75 mg/dL (ref 0–99)
Triglycerides: 61 mg/dL (ref 0–149)
VLDL Cholesterol Cal: 13 mg/dL (ref 5–40)

## 2022-02-01 LAB — CBC WITH DIFFERENTIAL/PLATELET
Basophils Absolute: 0.1 10*3/uL (ref 0.0–0.2)
Basos: 1 %
EOS (ABSOLUTE): 0.1 10*3/uL (ref 0.0–0.4)
Eos: 2 %
Hematocrit: 41.1 % (ref 34.0–46.6)
Hemoglobin: 12.9 g/dL (ref 11.1–15.9)
Immature Grans (Abs): 0 10*3/uL (ref 0.0–0.1)
Immature Granulocytes: 0 %
Lymphocytes Absolute: 2 10*3/uL (ref 0.7–3.1)
Lymphs: 27 %
MCH: 25.7 pg — ABNORMAL LOW (ref 26.6–33.0)
MCHC: 31.4 g/dL — ABNORMAL LOW (ref 31.5–35.7)
MCV: 82 fL (ref 79–97)
Monocytes Absolute: 0.5 10*3/uL (ref 0.1–0.9)
Monocytes: 7 %
Neutrophils Absolute: 4.6 10*3/uL (ref 1.4–7.0)
Neutrophils: 63 %
Platelets: 313 10*3/uL (ref 150–450)
RBC: 5.02 x10E6/uL (ref 3.77–5.28)
RDW: 14.9 % (ref 11.7–15.4)
WBC: 7.3 10*3/uL (ref 3.4–10.8)

## 2022-02-01 LAB — TSH+FREE T4
Free T4: 1.12 ng/dL (ref 0.82–1.77)
TSH: 1.75 u[IU]/mL (ref 0.450–4.500)

## 2022-02-01 LAB — VITAMIN D 25 HYDROXY (VIT D DEFICIENCY, FRACTURES): Vit D, 25-Hydroxy: 36.3 ng/mL (ref 30.0–100.0)

## 2022-02-01 LAB — HGB A1C W/O EAG: Hgb A1c MFr Bld: 6.9 % — ABNORMAL HIGH (ref 4.8–5.6)

## 2022-02-03 ENCOUNTER — Ambulatory Visit: Payer: PPO | Admitting: Nurse Practitioner

## 2022-02-07 ENCOUNTER — Ambulatory Visit (INDEPENDENT_AMBULATORY_CARE_PROVIDER_SITE_OTHER): Payer: PPO | Admitting: Nurse Practitioner

## 2022-02-07 ENCOUNTER — Encounter: Payer: Self-pay | Admitting: Nurse Practitioner

## 2022-02-07 VITALS — BP 130/78 | HR 77 | Temp 97.2°F | Resp 16 | Ht 67.0 in | Wt 216.2 lb

## 2022-02-07 DIAGNOSIS — R300 Dysuria: Secondary | ICD-10-CM

## 2022-02-07 DIAGNOSIS — E119 Type 2 diabetes mellitus without complications: Secondary | ICD-10-CM | POA: Diagnosis not present

## 2022-02-07 DIAGNOSIS — E785 Hyperlipidemia, unspecified: Secondary | ICD-10-CM | POA: Diagnosis not present

## 2022-02-07 DIAGNOSIS — E1169 Type 2 diabetes mellitus with other specified complication: Secondary | ICD-10-CM

## 2022-02-07 DIAGNOSIS — Z800 Family history of malignant neoplasm of digestive organs: Secondary | ICD-10-CM | POA: Diagnosis not present

## 2022-02-07 DIAGNOSIS — Z0001 Encounter for general adult medical examination with abnormal findings: Secondary | ICD-10-CM | POA: Diagnosis not present

## 2022-02-07 DIAGNOSIS — I10 Essential (primary) hypertension: Secondary | ICD-10-CM | POA: Diagnosis not present

## 2022-02-07 MED ORDER — ROSUVASTATIN CALCIUM 5 MG PO TABS: 5.0000 mg | ORAL_TABLET | Freq: Every day | ORAL | 1 refills | Status: DC

## 2022-02-07 NOTE — Progress Notes (Signed)
[facility] [place], [place] [address]  Internal MEDICINE  Office Visit Note  Patient Name: Holly Forbes  [id number]  [id number]  Date of Service: 02/07/2022  Chief Complaint  Patient presents with   Medicare Wellness   Diabetes   Gastroesophageal Reflux   Hypertension    HPI Piccola presents for an annual well visit and physical exam.  Well-appearing 71 year old female with hypertension, diabetes, GERD, allergic rhinitis, and osteoarthritis. -- Due for mammogram, goes to [facility] imaging, patient will call to schedule -- Diabetic eye exam scheduled in October --All other preventive screenings UTD -- Labs were done prior to office visit --most of her labs were normal.  A1c is 6.9, no change from June, fasting blood glucose was 131.  Fasting glucose at home is usually around 120.  Gained 4 pounds since previous office visit, eating more bread and meat.  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 -- Cholesterol levels are normal, taking rosuvastatin 5 mg daily -- No new or worsening pain, no other concerns or questions.    Current Medication: Outpatient Encounter Medications as of 02/07/2022  Medication Sig   amLODipine (NORVASC) 2.5 MG tablet TAKE 1 TABLET BY MOUTH EVERY NIGHT AT BEDTIME FOR BLOOD PRESSURE   aspirin 81 MG tablet Take 81 mg by mouth daily.    Blood Glucose Monitoring Suppl (ONETOUCH VERIO FLEX SYSTEM) w/Device KIT Use as directed DX E11.65   canagliflozin (INVOKANA) 300 MG TABS tablet Take 1 tablet (300 mg total) by mouth daily before breakfast.   Cholecalciferol (DIALYVITE VITAMIN D 5000) 125 MCG (5000 UT) capsule Take 5,000 Units by mouth daily.   cyanocobalamin 1000 MCG tablet Take 1,000 mcg by mouth daily.   dorzolamide-timolol (COSOPT) 22.3-6.8 MG/ML ophthalmic solution Place 1 drop into both eyes every evening.   furosemide (LASIX) 20 MG tablet TAKE 1 TABLET BY MOUTH DAILY   glucose blood  (ONETOUCH VERIO) test strip USE TO TEST ONCE DAILY AS DIRECTED   ibuprofen (ADVIL) 400 MG tablet Take one tab po bid prn for pain   Lancets (ONETOUCH DELICA PLUS [id number]) MISC USE AS DIRECTED ONCE A DAY   latanoprost (XALATAN) 0.005 % ophthalmic solution Place 1 drop into both eyes at bedtime.    lisinopril (ZESTRIL) 40 MG tablet TAKE 1 TABLET(40 MG) BY MOUTH DAILY   pantoprazole (PROTONIX) 20 MG tablet TAKE 1 TABLET BY MOUTH DAILY   [DISCONTINUED] rosuvastatin (CRESTOR) 5 MG tablet TAKE 1 TABLET BY MOUTH DAILY   rosuvastatin (CRESTOR) 5 MG tablet Take 1 tablet (5 mg total) by mouth daily.   Facility-Administered Encounter Medications as of 02/07/2022  Medication   triamcinolone acetonide (KENALOG) 10 MG/ML injection 10 mg    Surgical History: Past Surgical History:  Procedure Laterality Date   ABDOMINAL HYSTERECTOMY     BREAST EXCISIONAL BIOPSY Left 2015   cyst removed   CATARACT EXTRACTION W/PHACO Right 03/27/2020   Procedure: CATARACT EXTRACTION PHACO AND INTRAOCULAR LENS PLACEMENT (IOC) RIGHT 5.03 00:50.6 9.9%;  Surgeon: Leandrew Koyanagi, MD;  Location: [place];  Service: Ophthalmology;  Laterality: Right;   CATARACT EXTRACTION W/PHACO Left 04/18/2020   Procedure: CATARACT EXTRACTION PHACO AND INTRAOCULAR LENS PLACEMENT (IOC) LEFT DIABETIC 4.56 00:51.7 8.8%;  Surgeon: Leandrew Koyanagi, MD;  Location: [facility];  Service: Ophthalmology;  Laterality: Left;   CHOLECYSTECTOMY     COLONOSCOPY N/A 09/25/2014   Procedure: COLONOSCOPY;  Surgeon: Lucilla Lame, MD;  Location: [place];  Service: Gastroenterology;  Laterality:  N/A;  cecum time- 6270   COLONOSCOPY WITH PROPOFOL N/A 07/16/2020   Procedure: COLONOSCOPY WITH PROPOFOL;  Surgeon: Lin Landsman, MD;  Location: [facility];  Service: Endoscopy;  Laterality: N/A;   CYST REMOVAL NECK     ESOPHAGOGASTRODUODENOSCOPY N/A 09/25/2014   Procedure: ESOPHAGOGASTRODUODENOSCOPY (EGD);  Surgeon: Lucilla Lame, MD;   Location: [place];  Service: Gastroenterology;  Laterality: N/A;   ESOPHAGOGASTRODUODENOSCOPY (EGD) WITH PROPOFOL N/A 04/07/2018   Procedure: ESOPHAGOGASTRODUODENOSCOPY (EGD) WITH PROPOFOL;  Surgeon: Toledo, Benay Pike, MD;  Location: [facility];  Service: Gastroenterology;  Laterality: N/A;   POLYPECTOMY  09/25/2014   Procedure: POLYPECTOMY INTESTINAL;  Surgeon: Lucilla Lame, MD;  Location: [place];  Service: Gastroenterology;;    Medical History: 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   Back pain    Left sciatica, low back pain   Cataract    Diabetes mellitus without complication (HCC)    GERD (gastroesophageal reflux disease)    Glaucoma    Hypertension    Vitamin D deficiency     Family History: Family History  Problem Relation Age of Onset   Heart disease Sister    Diabetes Sister    Hypertension Sister    Heart disease Brother    Diabetes Brother    Hypertension Brother    Breast cancer Daughter 18   Colon cancer Mother    Stroke Mother    Lung cancer Father     Social History   Socioeconomic History   Marital status: Married    Spouse name: Not on file   Number of children: Not on file   Years of education: Not on file   Highest education level: Not on file  Occupational History   Not on file  Tobacco Use   Smoking status: Former    Packs/day: 0.25    Years: 15.00    Total pack years: 3.75    Types: Cigarettes    Quit date: 1995    Years since quitting: 28.7   Smokeless tobacco: Never  Vaping Use   Vaping Use: Never used  Substance and Sexual Activity   Alcohol use: Yes    Comment: 3-5 glasses wine/month   Drug use: Not Currently   Sexual activity: Not on file  Other Topics Concern   Not on file  Social History Narrative   Not on file   Social Determinants of Health   Financial Resource Strain: Low Risk  (10/24/2020)   Overall Financial Resource Strain  (CARDIA)    Difficulty of Paying Living Expenses: Not very hard  Food Insecurity: Not on file  Transportation Needs: Not on file  Physical Activity: Not on file  Stress: Not on file  Social Connections: Not on file  Intimate Partner Violence: Not on file      Review of Systems  Constitutional:  Negative for activity change, appetite change, chills, fatigue, fever and unexpected weight change.  HENT: Negative.  Negative for congestion, ear pain, rhinorrhea, sore throat and trouble swallowing.   Eyes: Negative.   Respiratory: Negative.  Negative for cough, chest tightness, shortness of breath and wheezing.   Cardiovascular: Negative.  Negative for chest pain.  Gastrointestinal: Negative.  Negative for abdominal pain, blood in stool, constipation, diarrhea, nausea and vomiting.  Endocrine: Negative.   Genitourinary: Negative.  Negative for difficulty urinating, dysuria, frequency, hematuria and urgency.  Musculoskeletal: Negative.  Negative for arthralgias, back pain,  joint swelling, myalgias and neck pain.  Skin: Negative.  Negative for rash and wound.  Allergic/Immunologic: Negative.  Negative for immunocompromised state.  Neurological: Negative.  Negative for dizziness, seizures, numbness and headaches.  Hematological: Negative.   Psychiatric/Behavioral: Negative.  Negative for behavioral problems, self-injury and suicidal ideas. The patient is not nervous/anxious.     Vital Signs: BP 130/78   Pulse 77   Temp (!) 97.2 F (36.2 C)   Resp 16   Ht $R'5\' 7"'jP$  (1.702 m)   Wt 216 lb 3.2 oz (98.1 kg)   LMP  (LMP Unknown)   SpO2 100%   BMI 33.86 kg/m    Physical Exam Vitals reviewed.  Constitutional:      General: She is not in acute distress.    Appearance: Normal appearance. She is well-developed. She is obese. She is not ill-appearing or diaphoretic.  HENT:     Head: Normocephalic and atraumatic.     Right Ear: Tympanic membrane, ear canal and external ear normal.     Left  Ear: Tympanic membrane, ear canal and external ear normal.     Nose: Nose normal. No congestion or rhinorrhea.     Mouth/Throat:     Mouth: Mucous membranes are moist.     Pharynx: Oropharynx is clear. No oropharyngeal exudate or posterior oropharyngeal erythema.  Eyes:     General: No scleral icterus.       Right eye: No discharge.        Left eye: No discharge.     Extraocular Movements: Extraocular movements intact.     Conjunctiva/sclera: Conjunctivae normal.     Pupils: Pupils are equal, round, and reactive to light.  Neck:     Thyroid: No thyromegaly.     Vascular: No JVD.     Trachea: No tracheal deviation.  Cardiovascular:     Rate and Rhythm: Normal rate and regular rhythm.     Pulses: Normal pulses.     Heart sounds: Normal heart sounds. No murmur heard.    No friction rub. No gallop.  Pulmonary:     Effort: Pulmonary effort is normal. No respiratory distress.     Breath sounds: Normal breath sounds. No stridor. No wheezing or rales.  Chest:     Chest wall: No tenderness.  Breasts:    Breasts are symmetrical.     Right: Normal. No swelling, bleeding, inverted nipple, mass, nipple discharge, skin change or tenderness.     Left: Normal. No swelling, bleeding, inverted nipple, mass, nipple discharge, skin change or tenderness.  Abdominal:     General: Bowel sounds are normal. There is no distension.     Palpations: Abdomen is soft. There is no mass.     Tenderness: There is no abdominal tenderness. There is no guarding or rebound.  Musculoskeletal:        General: No tenderness or deformity. Normal range of motion.     Cervical back: Normal range of motion and neck supple.  Lymphadenopathy:     Cervical: No cervical adenopathy.     Upper Body:     Right upper body: No supraclavicular, axillary or pectoral adenopathy.     Left upper body: No supraclavicular, axillary or pectoral adenopathy.  Skin:    General: Skin is warm and dry.     Capillary Refill: Capillary  refill takes less than 2 seconds.     Coloration: Skin is not pale.     Findings: No erythema or rash.  Neurological:  Mental Status: She is alert and oriented to person, place, and time.     Cranial Nerves: No cranial nerve deficit.     Motor: No abnormal muscle tone.     Coordination: Coordination normal.     Gait: Gait normal.     Deep Tendon Reflexes: Reflexes are normal and symmetric.  Psychiatric:        Mood and Affect: Mood normal.        Behavior: Behavior normal.        Thought Content: Thought content normal.        Judgment: Judgment normal.        Assessment/Plan: 1. Encounter for general adult medical examination with abnormal findings Age-appropriate preventive screenings and vaccinations discussed, annual physical exam completed. Routine labs drawn last week, results discussed with patient today. PHM updated.   2. Essential hypertension Stable, continue medications as prescribed  3. Type 2 diabetes mellitus without complication, without long-term current use of insulin (HCC) No improvement or worsening of A1C, discussed diet and lifestyle modifications with patient, will repeat A1C in 3 months  4. Hyperlipidemia associated with type 2 diabetes mellitus (HCC) Lipid panel normal, controlled with rosuvastatin, continue as prescribed. - rosuvastatin (CRESTOR) 5 MG tablet; Take 1 tablet (5 mg total) by mouth daily.  Dispense: 90 tablet; Refill: 1  5. Dysuria Routine urinalysis - UA/M w/rflx Culture, Routine        General Counseling: Shauniece verbalizes understanding of the findings of todays visit and agrees with plan of treatment. I have discussed any further diagnostic evaluation that may be needed or ordered today. We also reviewed her medications today. she has been encouraged to call the office with any questions or concerns that should arise related to todays visit.    Orders Placed This Encounter  Procedures   UA/M w/rflx Culture, Routine     Meds ordered this encounter  Medications   rosuvastatin (CRESTOR) 5 MG tablet    Sig: Take 1 tablet (5 mg total) by mouth daily.    Dispense:  90 tablet    Refill:  1    Return in about 3 months (around 05/09/2022) for F/U, Recheck A1C, [place] [facility] PCP.   Total time spent:30 Minutes Time spent includes review of chart, medications, test results, and follow up plan with the patient.   [place] Controlled Substance Database was reviewed by me.  This patient was seen by Jonetta Osgood, FNP-C in collaboration with Dr. Clayborn Bigness as a part of collaborative care agreement.  Mikia Delaluz R. Valetta Fuller, MSN, FNP-C Internal medicine

## 2022-02-08 LAB — MICROSCOPIC EXAMINATION
Casts: NONE SEEN /lpf
Epithelial Cells (non renal): >10 /hpf — AB (ref 0–10)

## 2022-02-08 LAB — UA/M W/RFLX CULTURE, ROUTINE
Bilirubin, UA: NEGATIVE
Ketones, UA: NEGATIVE
Leukocytes,UA: NEGATIVE
Nitrite, UA: NEGATIVE
Protein,UA: NEGATIVE
RBC, UA: NEGATIVE
Specific Gravity, UA: 1.028 (ref 1.005–1.030)
Urobilinogen, Ur: 0.2 mg/dL (ref 0.2–1.0)
pH, UA: 6 (ref 5.0–7.5)

## 2022-02-24 ENCOUNTER — Other Ambulatory Visit: Payer: Self-pay | Admitting: Nurse Practitioner

## 2022-02-24 DIAGNOSIS — E1169 Type 2 diabetes mellitus with other specified complication: Secondary | ICD-10-CM

## 2022-05-09 ENCOUNTER — Ambulatory Visit: Payer: PPO | Admitting: Nurse Practitioner

## 2022-06-19 ENCOUNTER — Encounter: Payer: Self-pay | Admitting: Nurse Practitioner

## 2022-06-19 ENCOUNTER — Ambulatory Visit (INDEPENDENT_AMBULATORY_CARE_PROVIDER_SITE_OTHER): Payer: PPO | Admitting: Nurse Practitioner

## 2022-06-19 VITALS — BP 135/71 | HR 64 | Temp 97.4°F | Resp 16 | Ht 67.0 in | Wt 210.4 lb

## 2022-06-19 DIAGNOSIS — K219 Gastro-esophageal reflux disease without esophagitis: Secondary | ICD-10-CM

## 2022-06-19 DIAGNOSIS — E119 Type 2 diabetes mellitus without complications: Secondary | ICD-10-CM

## 2022-06-19 DIAGNOSIS — I10 Essential (primary) hypertension: Secondary | ICD-10-CM

## 2022-06-19 LAB — POCT GLYCOSYLATED HEMOGLOBIN (HGB A1C): Hemoglobin A1C: 6.9 % — AB (ref 4.0–5.6)

## 2022-06-19 MED ORDER — PANTOPRAZOLE SODIUM 20 MG PO TBEC: 20.0000 mg | DELAYED_RELEASE_TABLET | Freq: Every day | ORAL | 3 refills | Status: DC

## 2022-06-19 MED ORDER — RYBELSUS 7 MG PO TABS: 7.0000 mg | ORAL_TABLET | Freq: Every day | ORAL | 2 refills | Status: DC

## 2022-06-19 MED ORDER — AMLODIPINE BESYLATE 2.5 MG PO TABS: ORAL_TABLET | ORAL | 3 refills | Status: DC

## 2022-06-19 MED ORDER — LISINOPRIL 40 MG PO TABS: ORAL_TABLET | ORAL | 3 refills | Status: DC

## 2022-06-19 NOTE — Progress Notes (Signed)
[facility] [place], [place] [address]  Internal MEDICINE  Office Visit Note  Patient Name: Holly Forbes  [id number]  [id number]  Date of Service: 06/19/2022  Chief Complaint  Patient presents with   Follow-up   Diabetes   Gastroesophageal Reflux   Hypertension    HPI Holly Forbes presents for a follow-up visit for diabetes, hypertension and weight loss. Diabetes -- A1c is elevated at 6.9, no change since the previous 2 A1c check. Maintaining on invokana but this med is no longer preferred on insurance. Has tried metformin, and trulicity before.  Hypertension -- controlled with current medications Weight loss - lost 6 lbs, did fasting in January.    Current Medication: Outpatient Encounter Medications as of 06/19/2022  Medication Sig   aspirin 81 MG tablet Take 81 mg by mouth daily.    Blood Glucose Monitoring Suppl (ONETOUCH VERIO FLEX SYSTEM) w/Device KIT Use as directed DX E11.65   Cholecalciferol (DIALYVITE VITAMIN D 5000) 125 MCG (5000 UT) capsule Take 5,000 Units by mouth daily.   cyanocobalamin 1000 MCG tablet Take 1,000 mcg by mouth daily.   dorzolamide-timolol (COSOPT) 22.3-6.8 MG/ML ophthalmic solution Place 1 drop into both eyes every evening.   furosemide (LASIX) 20 MG tablet TAKE 1 TABLET BY MOUTH DAILY   glucose blood (ONETOUCH VERIO) test strip USE FOR TESTING ONCE DAILY AS DIRECTED   ibuprofen (ADVIL) 400 MG tablet Take one tab po bid prn for pain   Lancets (ONETOUCH DELICA PLUS [id number]) MISC USE AS DIRECTED ONCE A DAY   latanoprost (XALATAN) 0.005 % ophthalmic solution Place 1 drop into both eyes at bedtime.    rosuvastatin (CRESTOR) 5 MG tablet TAKE 1 TABLET BY MOUTH DAILY   Semaglutide (RYBELSUS) 7 MG TABS Take 1 tablet (7 mg total) by mouth daily before breakfast. On an empty stomach with no more than 4 oz of water and wait 30 min before eating, drinking or taking other meds.   [DISCONTINUED] amLODipine (NORVASC) 2.5 MG tablet  TAKE 1 TABLET BY MOUTH EVERY NIGHT AT BEDTIME FOR BLOOD PRESSURE   [DISCONTINUED] canagliflozin (INVOKANA) 300 MG TABS tablet Take 1 tablet (300 mg total) by mouth daily before breakfast.   [DISCONTINUED] lisinopril (ZESTRIL) 40 MG tablet TAKE 1 TABLET(40 MG) BY MOUTH DAILY   [DISCONTINUED] pantoprazole (PROTONIX) 20 MG tablet TAKE 1 TABLET BY MOUTH DAILY   amLODipine (NORVASC) 2.5 MG tablet TAKE 1 TABLET BY MOUTH EVERY NIGHT AT BEDTIME FOR BLOOD PRESSURE   lisinopril (ZESTRIL) 40 MG tablet TAKE 1 TABLET(40 MG) BY MOUTH DAILY   pantoprazole (PROTONIX) 20 MG tablet Take 1 tablet (20 mg total) by mouth daily.   Facility-Administered Encounter Medications as of 06/19/2022  Medication   triamcinolone acetonide (KENALOG) 10 MG/ML injection 10 mg    Surgical History: Past Surgical History:  Procedure Laterality Date   ABDOMINAL HYSTERECTOMY     BREAST EXCISIONAL BIOPSY Left 2015   cyst removed   CATARACT EXTRACTION W/PHACO Right 03/27/2020   Procedure: CATARACT EXTRACTION PHACO AND INTRAOCULAR LENS PLACEMENT (IOC) RIGHT 5.03 00:50.6 9.9%;  Surgeon: Leandrew Koyanagi, MD;  Location: [place] [facility];  Service: Ophthalmology;  Laterality: Right;   CATARACT EXTRACTION W/PHACO Left 04/18/2020   Procedure: CATARACT EXTRACTION PHACO AND INTRAOCULAR LENS PLACEMENT (IOC) LEFT DIABETIC 4.56 00:51.7 8.8%;  Surgeon: Leandrew Koyanagi, MD;  Location: [facility];  Service: Ophthalmology;  Laterality: Left;   CHOLECYSTECTOMY     COLONOSCOPY N/A 09/25/2014   Procedure: COLONOSCOPY;  Surgeon: Lucilla Lame, MD;  Location: [place];  Service: Gastroenterology;  Laterality: N/A;  cecum time- [id number]   COLONOSCOPY WITH PROPOFOL N/A 07/16/2020   Procedure: COLONOSCOPY WITH PROPOFOL;  Surgeon: Lin Landsman, MD;  Location: [facility];  Service: Endoscopy;  Laterality: N/A;   CYST REMOVAL NECK     ESOPHAGOGASTRODUODENOSCOPY N/A 09/25/2014   Procedure: ESOPHAGOGASTRODUODENOSCOPY (EGD);  Surgeon:  Lucilla Lame, MD;  Location: [place];  Service: Gastroenterology;  Laterality: N/A;   ESOPHAGOGASTRODUODENOSCOPY (EGD) WITH PROPOFOL N/A 04/07/2018   Procedure: ESOPHAGOGASTRODUODENOSCOPY (EGD) WITH PROPOFOL;  Surgeon: Toledo, Benay Pike, MD;  Location: [facility];  Service: Gastroenterology;  Laterality: N/A;   POLYPECTOMY  09/25/2014   Procedure: POLYPECTOMY INTESTINAL;  Surgeon: Lucilla Lame, MD;  Location: [place];  Service: Gastroenterology;;    Medical History: 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   Back pain    Left sciatica, low back pain   Cataract    Diabetes mellitus without complication (HCC)    GERD (gastroesophageal reflux disease)    Glaucoma    Hypertension    Vitamin D deficiency     Family History: Family History  Problem Relation Age of Onset   Heart disease Sister    Diabetes Sister    Hypertension Sister    Heart disease Brother    Diabetes Brother    Hypertension Brother    Breast cancer Daughter 52   Colon cancer Mother    Stroke Mother    Lung cancer Father     Social History   Socioeconomic History   Marital status: Married    Spouse name: Not on file   Number of children: Not on file   Years of education: Not on file   Highest education level: Not on file  Occupational History   Not on file  Tobacco Use   Smoking status: Former    Packs/day: 0.25    Years: 15.00    Total pack years: 3.75    Types: Cigarettes    Quit date: 1995    Years since quitting: 29.1   Smokeless tobacco: Never  Vaping Use   Vaping Use: Never used  Substance and Sexual Activity   Alcohol use: Yes    Comment: 3-5 glasses wine/month   Drug use: Not Currently   Sexual activity: Not on file  Other Topics Concern   Not on file  Social History Narrative   Not on file   Social Determinants of Health   Financial Resource Strain: Low Risk  (10/24/2020)   Overall Financial  Resource Strain (CARDIA)    Difficulty of Paying Living Expenses: Not very hard  Food Insecurity: Not on file  Transportation Needs: Not on file  Physical Activity: Not on file  Stress: Not on file  Social Connections: Not on file  Intimate Partner Violence: Not on file      Review of Systems  Constitutional:  Negative for chills, fatigue and unexpected weight change.  HENT:  Negative for congestion, postnasal drip, rhinorrhea, sneezing and sore throat.   Eyes:  Negative for redness.  Respiratory:  Negative for cough, chest tightness and shortness of breath.   Cardiovascular:  Negative for chest pain and palpitations.  Gastrointestinal:  Negative for abdominal pain, constipation, diarrhea, nausea and vomiting.  Genitourinary:  Negative for dysuria and frequency.  Musculoskeletal:  Negative for arthralgias, back pain, joint swelling and neck pain.  Skin:  Negative for rash.  Neurological: Negative.  Negative for tremors and numbness.  Hematological:  Negative for adenopathy. Does not bruise/bleed easily.  Psychiatric/Behavioral:  Negative for behavioral problems (Depression), sleep disturbance and suicidal ideas. The patient is not nervous/anxious.     Vital Signs: BP 135/71   Pulse 64   Temp (!) 97.4 F (36.3 C)   Resp 16   Ht '5\' 7"'$  (1.702 m)   Wt 210 lb 6.4 oz (95.4 kg)   LMP  (LMP Unknown)   SpO2 95%   BMI 32.95 kg/m    Physical Exam Constitutional:      Appearance: Normal appearance.  HENT:     Head: Normocephalic and atraumatic.     Nose: Nose normal.     Mouth/Throat:     Mouth: Mucous membranes are moist.     Pharynx: No posterior oropharyngeal erythema.  Eyes:     Extraocular Movements: Extraocular movements intact.     Pupils: Pupils are equal, round, and reactive to light.  Cardiovascular:     Pulses: Normal pulses.     Heart sounds: Normal heart sounds.  Pulmonary:     Effort: Pulmonary effort is normal.     Breath sounds: Normal breath sounds.   Neurological:     General: No focal deficit present.     Mental Status: She is alert.  Psychiatric:        Mood and Affect: Mood normal.        Behavior: Behavior normal.        Assessment/Plan: 1. Type 2 diabetes mellitus without complication, without long-term current use of insulin (HCC) No significant change in A1c, continue rybelsus as prescribed.  - POCT glycosylated hemoglobin (Hb A1C) - Semaglutide (RYBELSUS) 7 MG TABS; Take 1 tablet (7 mg total) by mouth daily before breakfast. On an empty stomach with no more than 4 oz of water and wait 30 min before eating, drinking or taking other meds.  Dispense: 30 tablet; Refill: 2  2. Essential hypertension Stable, continue lisinopril and amlodipine as prescribed.  - lisinopril (ZESTRIL) 40 MG tablet; TAKE 1 TABLET(40 MG) BY MOUTH DAILY  Dispense: 90 tablet; Refill: 3 - amLODipine (NORVASC) 2.5 MG tablet; TAKE 1 TABLET BY MOUTH EVERY NIGHT AT BEDTIME FOR BLOOD PRESSURE  Dispense: 90 tablet; Refill: 3  3. Gastroesophageal reflux disease without esophagitis Stable, no change, continue protonix as prescribed.  - pantoprazole (PROTONIX) 20 MG tablet; Take 1 tablet (20 mg total) by mouth daily.  Dispense: 90 tablet; Refill: 3   General Counseling: Holly Forbes verbalizes understanding of the findings of todays visit and agrees with plan of treatment. I have discussed any further diagnostic evaluation that may be needed or ordered today. We also reviewed her medications today. she has been encouraged to call the office with any questions or concerns that should arise related to todays visit.    Orders Placed This Encounter  Procedures   POCT glycosylated hemoglobin (Hb A1C)    Meds ordered this encounter  Medications   Semaglutide (RYBELSUS) 7 MG TABS    Sig: Take 1 tablet (7 mg total) by mouth daily before breakfast. On an empty stomach with no more than 4 oz of water and wait 30 min before eating, drinking or taking other meds.     Dispense:  30 tablet    Refill:  2   lisinopril (ZESTRIL) 40 MG tablet    Sig: TAKE 1 TABLET(40 MG) BY MOUTH DAILY    Dispense:  90 tablet    Refill:  3    For future refills   amLODipine (NORVASC) 2.5 MG tablet    Sig: TAKE 1 TABLET BY MOUTH EVERY NIGHT AT BEDTIME FOR BLOOD PRESSURE    Dispense:  90 tablet    Refill:  3    For future refills   pantoprazole (PROTONIX) 20 MG tablet    Sig: Take 1 tablet (20 mg total) by mouth daily.    Dispense:  90 tablet    Refill:  3    Return in about 1 month (around 07/18/2022) for F/U, eval new med, Holly Forbes PCP.   Total time spent:30 Minutes Time spent includes review of chart, medications, test results, and follow up plan with the patient.   [place] Controlled Substance Database was reviewed by me.  This patient was seen by Jonetta Osgood, FNP-C in collaboration with Dr. Clayborn Bigness as a part of collaborative care agreement.   Holly Derrington R. Valetta Fuller, MSN, FNP-C Internal medicine

## 2022-06-20 ENCOUNTER — Telehealth: Payer: Self-pay

## 2022-06-20 NOTE — Telephone Encounter (Signed)
PA was sent for Rybelsus.

## 2022-06-20 NOTE — Telephone Encounter (Signed)
PA was approved for Rybelsus. Patient was notified.

## 2022-06-21 ENCOUNTER — Encounter: Payer: Self-pay | Admitting: Nurse Practitioner

## 2022-06-21 ENCOUNTER — Other Ambulatory Visit: Payer: Self-pay | Admitting: Internal Medicine

## 2022-06-21 DIAGNOSIS — K219 Gastro-esophageal reflux disease without esophagitis: Secondary | ICD-10-CM

## 2022-07-04 DIAGNOSIS — Z961 Presence of intraocular lens: Secondary | ICD-10-CM | POA: Diagnosis not present

## 2022-07-04 DIAGNOSIS — H401131 Primary open-angle glaucoma, bilateral, mild stage: Secondary | ICD-10-CM | POA: Diagnosis not present

## 2022-07-04 DIAGNOSIS — E119 Type 2 diabetes mellitus without complications: Secondary | ICD-10-CM | POA: Diagnosis not present

## 2022-07-04 LAB — HM DIABETES EYE EXAM

## 2022-07-04 IMAGING — CT CT ANGIO NECK
2 of 7 series · 9 of 34 positions shown · IV contrast (omnipaque)
Comparison: None.

CLINICAL DATA: Right carotid occlusion on ultrasound

EXAM:
CT ANGIOGRAPHY NECK
TECHNIQUE: Multidetector CT imaging of the neck was performed using the
standard protocol during bolus administration of intravenous
contrast. Multiplanar CT image reconstructions and MIPs were
obtained to evaluate the vascular anatomy. Carotid stenosis
measurements (when applicable) are obtained utilizing NASCET
criteria, using the distal internal carotid diameter as the
denominator.
CONTRAST:  75mL OMNIPAQUE IOHEXOL 350 MG/ML SOLN

[Series 6: ax thin mips cta neck 1.00 ax · axial · 0.48mm/px · z∈[-835,-643]mm · 6 of 315 slices shown]
[im 45/315  soft-tissue]
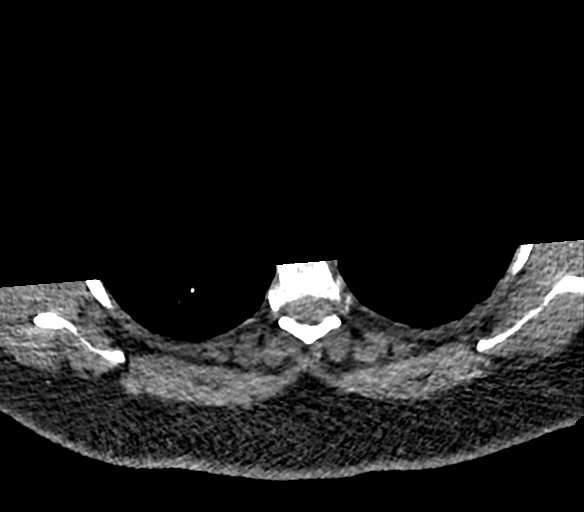
[im 90/315  bone]
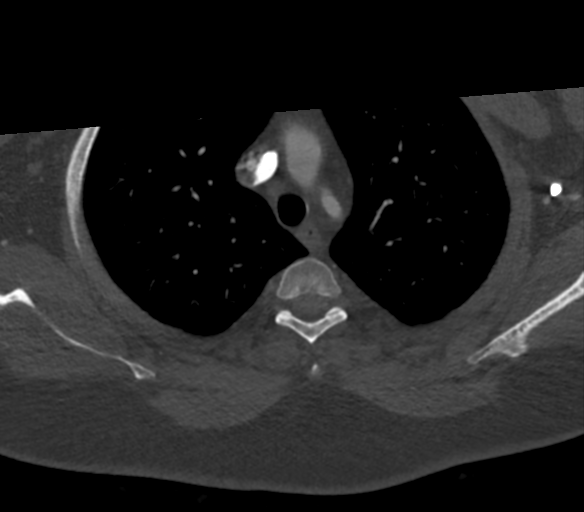
[im 135/315  soft-tissue]
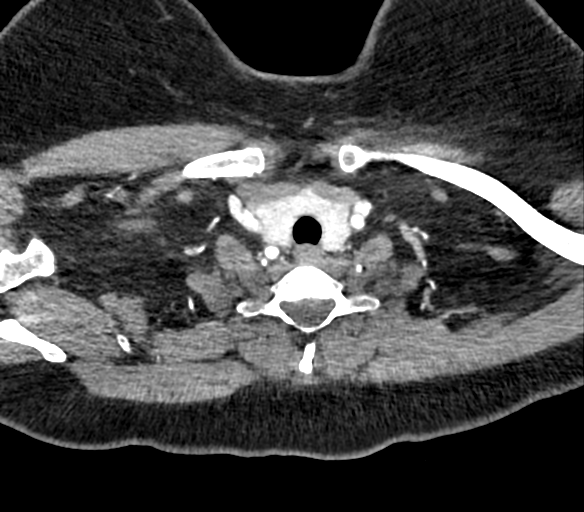
[im 180/315  bone]
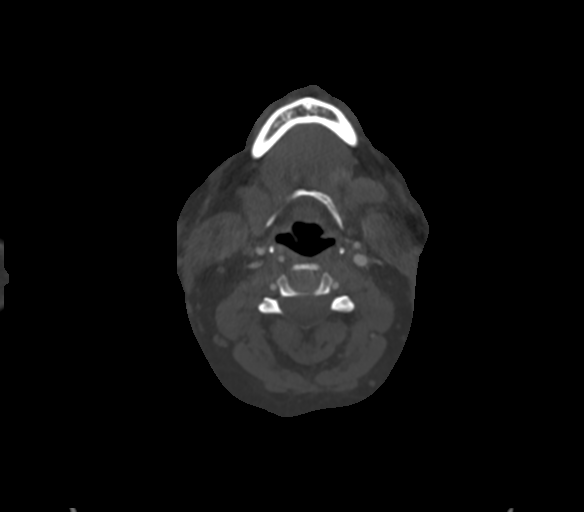
[im 225/315  soft-tissue]
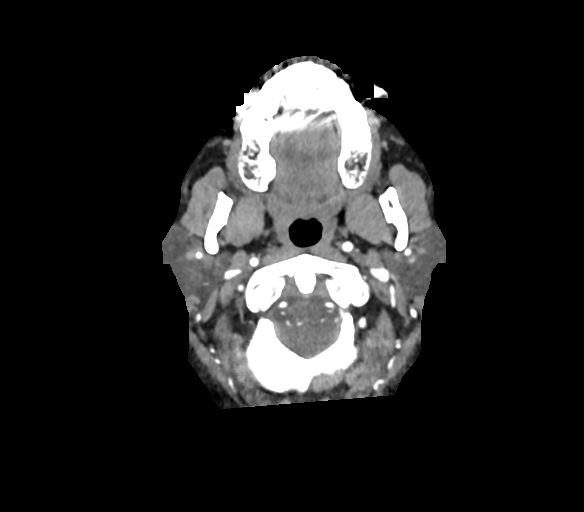
[im 270/315  bone]
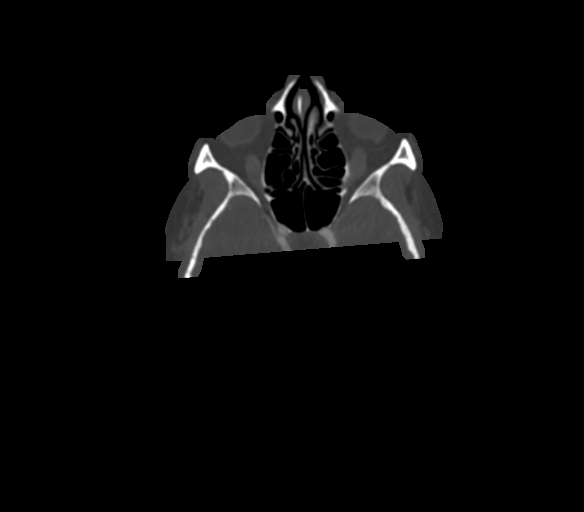

[Series 10: sag thin mips cta neck 1.00 sag · sagittal · 0.48mm/px · 3 of 282 slices shown]
[im 24/282  soft-tissue]
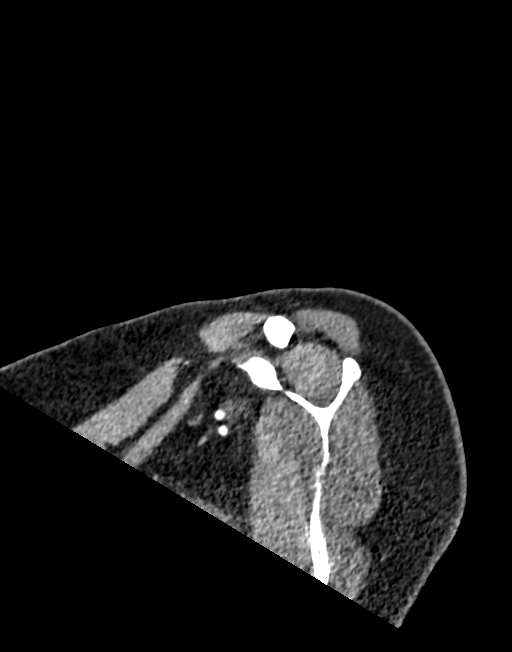
[im 141/282  soft-tissue]
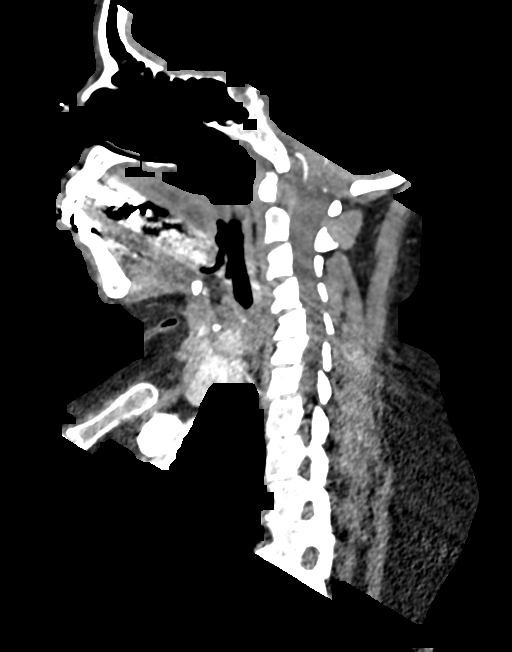
[im 259/282  soft-tissue]
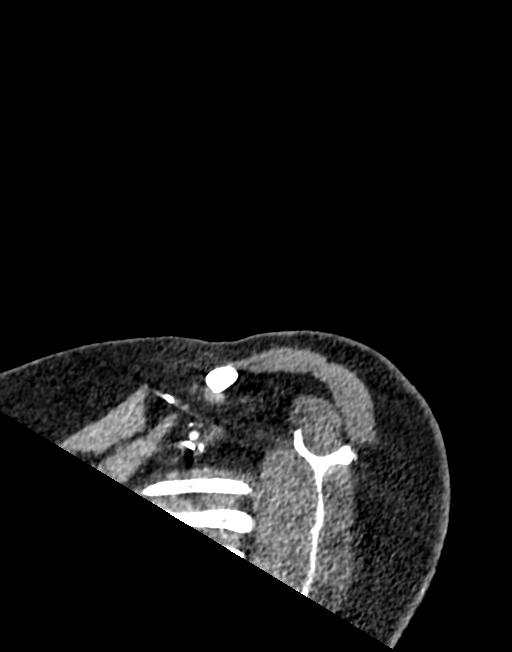

[9 of 34 positions shown; findings below may reference images not displayed]

FINDINGS: Aortic arch: Great vessel origins are patent.

Right carotid system: Common carotid is mildly tortuous. External
carotid is patent. There is minimal noncalcified plaque along the
proximal internal carotid, which is patent. No measurable stenosis.

Left carotid system: Common, internal, and external carotid arteries
are patent. There is no measurable stenosis at the ICA origin.

Vertebral arteries: Patent and codominant.  No measurable stenosis.

Skeleton: Cervical spine degenerative changes are greatest at C5-C6.
No high-grade stenosis.

Other neck: No mass or adenopathy.

Upper chest: Included upper lungs are clear.
IMPRESSION: No occlusion or hemodynamically significant stenosis. Specifically,
the right internal carotid is not occluded.

## 2022-07-16 ENCOUNTER — Other Ambulatory Visit: Payer: Self-pay | Admitting: Nurse Practitioner

## 2022-07-16 DIAGNOSIS — E119 Type 2 diabetes mellitus without complications: Secondary | ICD-10-CM

## 2022-07-17 ENCOUNTER — Encounter: Payer: Self-pay | Admitting: Nurse Practitioner

## 2022-07-17 ENCOUNTER — Ambulatory Visit (INDEPENDENT_AMBULATORY_CARE_PROVIDER_SITE_OTHER): Payer: PPO | Admitting: Nurse Practitioner

## 2022-07-17 VITALS — BP 129/53 | HR 70 | Temp 97.7°F | Resp 16 | Ht 67.0 in | Wt 208.6 lb

## 2022-07-17 DIAGNOSIS — E6609 Other obesity due to excess calories: Secondary | ICD-10-CM | POA: Diagnosis not present

## 2022-07-17 DIAGNOSIS — E119 Type 2 diabetes mellitus without complications: Secondary | ICD-10-CM | POA: Diagnosis not present

## 2022-07-17 DIAGNOSIS — Z6834 Body mass index (BMI) 34.0-34.9, adult: Secondary | ICD-10-CM | POA: Diagnosis not present

## 2022-07-17 DIAGNOSIS — I10 Essential (primary) hypertension: Secondary | ICD-10-CM | POA: Diagnosis not present

## 2022-07-17 NOTE — Progress Notes (Signed)
[facility] [place], [place] [address]  Internal MEDICINE  Office Visit Note  Patient Name: Holly Forbes  [id number]  [id number]  Date of Service: 07/17/2022  Chief Complaint  Patient presents with   Follow-up    Eval new med.     HPI [name] presents for a follow-up visit for Tolerating rybelsus '3mg'$ , getting ready to increase. Lost 2 more lbs, has lost inches, doing well Finishing up with invokana. Last A1c was 6.9 on 06/19/22.  BP is ok    Current Medication: Outpatient Encounter Medications as of 07/17/2022  Medication Sig   amLODipine (NORVASC) 2.5 MG tablet TAKE 1 TABLET BY MOUTH EVERY NIGHT AT BEDTIME FOR BLOOD PRESSURE   aspirin 81 MG tablet Take 81 mg by mouth daily.    Blood Glucose Monitoring Suppl (ONETOUCH VERIO FLEX SYSTEM) w/Device KIT Use as directed DX E11.65   Cholecalciferol (DIALYVITE VITAMIN D 5000) 125 MCG (5000 UT) capsule Take 5,000 Units by mouth daily.   cyanocobalamin 1000 MCG tablet Take 1,000 mcg by mouth daily.   dorzolamide-timolol (COSOPT) 22.3-6.8 MG/ML ophthalmic solution Place 1 drop into both eyes every evening.   furosemide (LASIX) 20 MG tablet TAKE 1 TABLET BY MOUTH DAILY   glucose blood (ONETOUCH VERIO) test strip USE FOR TESTING ONCE DAILY AS DIRECTED   ibuprofen (ADVIL) 400 MG tablet Take one tab po bid prn for pain   Lancets (ONETOUCH DELICA PLUS [id number]) MISC USE AS DIRECTED ONCE A DAY   latanoprost (XALATAN) 0.005 % ophthalmic solution Place 1 drop into both eyes at bedtime.    lisinopril (ZESTRIL) 40 MG tablet TAKE 1 TABLET(40 MG) BY MOUTH DAILY   pantoprazole (PROTONIX) 20 MG tablet TAKE 1 TABLET BY MOUTH EVERY DAY   rosuvastatin (CRESTOR) 5 MG tablet TAKE 1 TABLET BY MOUTH DAILY   Semaglutide (RYBELSUS) 7 MG TABS Take 1 tablet (7 mg total) by mouth daily before breakfast. On an empty stomach with no more than 4 oz of water and wait 30 min before eating, drinking or taking other meds.    Facility-Administered Encounter Medications as of 07/17/2022  Medication   triamcinolone acetonide (KENALOG) 10 MG/ML injection 10 mg    Surgical History: Past Surgical History:  Procedure Laterality Date   ABDOMINAL HYSTERECTOMY     BREAST EXCISIONAL BIOPSY Left 2015   cyst removed   CATARACT EXTRACTION W/PHACO Right 03/27/2020   Procedure: CATARACT EXTRACTION PHACO AND INTRAOCULAR LENS PLACEMENT (IOC) RIGHT 5.03 00:50.6 9.9%;  Surgeon: Leandrew Koyanagi, MD;  Location: [place];  Service: Ophthalmology;  Laterality: Right;   CATARACT EXTRACTION W/PHACO Left 04/18/2020   Procedure: CATARACT EXTRACTION PHACO AND INTRAOCULAR LENS PLACEMENT (IOC) LEFT DIABETIC 4.56 00:51.7 8.8%;  Surgeon: Leandrew Koyanagi, MD;  Location: [facility];  Service: Ophthalmology;  Laterality: Left;   CHOLECYSTECTOMY     COLONOSCOPY N/A 09/25/2014   Procedure: COLONOSCOPY;  Surgeon: Lucilla Lame, MD;  Location: [place];  Service: Gastroenterology;  Laterality: N/A;  cecum time- [id number]   COLONOSCOPY WITH PROPOFOL N/A 07/16/2020   Procedure: COLONOSCOPY WITH PROPOFOL;  Surgeon: Lin Landsman, MD;  Location: [facility];  Service: Endoscopy;  Laterality: N/A;   CYST REMOVAL NECK     ESOPHAGOGASTRODUODENOSCOPY N/A 09/25/2014   Procedure: ESOPHAGOGASTRODUODENOSCOPY (EGD);  Surgeon: Lucilla Lame, MD;  Location: [place];  Service: Gastroenterology;  Laterality: N/A;   ESOPHAGOGASTRODUODENOSCOPY (EGD) WITH PROPOFOL N/A 04/07/2018   Procedure: ESOPHAGOGASTRODUODENOSCOPY (EGD) WITH PROPOFOL;  Surgeon: Alice Reichert, Benay Pike, MD;  Location:  [facility];  Service: Gastroenterology;  Laterality: N/A;   POLYPECTOMY  09/25/2014   Procedure: POLYPECTOMY INTESTINAL;  Surgeon: Lucilla Lame, MD;  Location: [place];  Service: Gastroenterology;;    Medical History: 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   Back pain    Left  sciatica, low back pain   Cataract    Diabetes mellitus without complication (HCC)    GERD (gastroesophageal reflux disease)    Glaucoma    Hypertension    Vitamin D deficiency     Family History: Family History  Problem Relation Age of Onset   Heart disease Sister    Diabetes Sister    Hypertension Sister    Heart disease Brother    Diabetes Brother    Hypertension Brother    Breast cancer Daughter 60   Colon cancer Mother    Stroke Mother    Lung cancer Father     Social History   Socioeconomic History   Marital status: Married    Spouse name: Not on file   Number of children: Not on file   Years of education: Not on file   Highest education level: Not on file  Occupational History   Not on file  Tobacco Use   Smoking status: Former    Packs/day: 0.25    Years: 15.00    Total pack years: 3.75    Types: Cigarettes    Quit date: 1995    Years since quitting: 29.1   Smokeless tobacco: Never  Vaping Use   Vaping Use: Never used  Substance and Sexual Activity   Alcohol use: Yes    Comment: 3-5 glasses wine/month   Drug use: Not Currently   Sexual activity: Not on file  Other Topics Concern   Not on file  Social History Narrative   Not on file   Social Determinants of Health   Financial Resource Strain: Low Risk  (10/24/2020)   Overall Financial Resource Strain (CARDIA)    Difficulty of Paying Living Expenses: Not very hard  Food Insecurity: Not on file  Transportation Needs: Not on file  Physical Activity: Not on file  Stress: Not on file  Social Connections: Not on file  Intimate Partner Violence: Not on file      Review of Systems  Constitutional:  Negative for chills, fatigue and unexpected weight change.  HENT:  Negative for congestion, postnasal drip, rhinorrhea, sneezing and sore throat.   Eyes:  Negative for redness.  Respiratory:  Negative for cough, chest tightness and shortness of breath.   Cardiovascular:  Negative for chest pain and  palpitations.  Gastrointestinal:  Negative for abdominal pain, constipation, diarrhea, nausea and vomiting.  Genitourinary:  Negative for dysuria and frequency.  Musculoskeletal:  Negative for arthralgias, back pain, joint swelling and neck pain.  Skin:  Negative for rash.  Neurological: Negative.  Negative for tremors and numbness.  Hematological:  Negative for adenopathy. Does not bruise/bleed easily.  Psychiatric/Behavioral:  Negative for behavioral problems (Depression), sleep disturbance and suicidal ideas. The patient is not nervous/anxious.     Vital Signs: BP (!) 129/53   Pulse 70   Temp 97.7 F (36.5 C)   Resp 16   Ht '5\' 7"'$  (1.702 m)   Wt 208 lb 9.6 oz (94.6 kg)   LMP  (LMP Unknown)   SpO2 99%   BMI 32.67 kg/m    Physical Exam Vitals reviewed.  Constitutional:      Appearance: Normal appearance.  HENT:     Head: Normocephalic and atraumatic.     Nose: Nose normal.     Mouth/Throat:     Mouth: Mucous membranes are moist.     Pharynx: No posterior oropharyngeal erythema.  Eyes:     Extraocular Movements: Extraocular movements intact.     Pupils: Pupils are equal, round, and reactive to light.  Cardiovascular:     Pulses: Normal pulses.     Heart sounds: Normal heart sounds.  Pulmonary:     Effort: Pulmonary effort is normal. No respiratory distress.     Breath sounds: Normal breath sounds.  Neurological:     General: No focal deficit present.     Mental Status: She is alert and oriented to person, place, and time.  Psychiatric:        Mood and Affect: Mood normal.        Behavior: Behavior normal.        Assessment/Plan: 1. Type 2 diabetes mellitus without complication, without long-term current use of insulin (HCC) Increase rybelsus dose to '7mg'$  daily. Repeat A1c at next office visit on 02/12/23.  2. Essential hypertension BP stable, continue current medications as prescribed.   3. Class 1 obesity due to excess calories with serious comorbidity and  body mass index (BMI) of 34.0 to 34.9 in adult Lost 2 more lbs. On rybelsus for diabetes which will also aid in additional weight loss, about to increase to 7 mg daily. Follow up in September for annual physical exam and repeat A1c.    General Counseling: aritzel geerdes understanding of the findings of todays visit and agrees with plan of treatment. I have discussed any further diagnostic evaluation that may be needed or ordered today. We also reviewed her medications today. she has been encouraged to call the office with any questions or concerns that should arise related to todays visit.    No orders of the defined types were placed in this encounter.   No orders of the defined types were placed in this encounter.   Return for previously scheduled, CPE, Recheck A1C, Telly Broberg PCP on 02/12/23.   Total time spent:30 Minutes Time spent includes review of chart, medications, test results, and follow up plan with the patient.   [place] Controlled Substance Database was reviewed by me.  This patient was seen by Jonetta Osgood, FNP-C in collaboration with Dr. Clayborn Bigness as a part of collaborative care agreement.   Anieya Helman R. Valetta Fuller, MSN, FNP-C Internal medicine

## 2022-08-11 ENCOUNTER — Other Ambulatory Visit: Payer: Self-pay | Admitting: Nurse Practitioner

## 2022-08-11 DIAGNOSIS — I10 Essential (primary) hypertension: Secondary | ICD-10-CM

## 2022-08-11 DIAGNOSIS — E1169 Type 2 diabetes mellitus with other specified complication: Secondary | ICD-10-CM

## 2022-08-29 ENCOUNTER — Other Ambulatory Visit: Payer: Self-pay | Admitting: Nurse Practitioner

## 2022-09-03 ENCOUNTER — Other Ambulatory Visit: Payer: Self-pay | Admitting: Internal Medicine

## 2022-10-07 NOTE — Progress Notes (Signed)
[facility] Quality Team Note  Name: Rasa Matkowski Date of Birth: Aug 07, 1950 MRN: [id number] Date: 10/07/2022  [facility] Quality Team has reviewed this patient's chart, please see recommendations below:  [facility] Quality Other; (BCS GAP- PATIENT IS DUE FOR BREAST CANCER SCREENING. PLEASE ADDRESS DURING NEXT OFFICE VISIT. [facility] QUALITY COORDINATOR HAPPY TO ASSIST IN ANY WAY.)

## 2022-10-16 ENCOUNTER — Other Ambulatory Visit: Payer: Self-pay | Admitting: Nurse Practitioner

## 2022-10-16 DIAGNOSIS — E119 Type 2 diabetes mellitus without complications: Secondary | ICD-10-CM

## 2022-11-05 ENCOUNTER — Telehealth: Payer: Self-pay | Admitting: Nurse Practitioner

## 2022-11-05 NOTE — Telephone Encounter (Signed)
Has diabetic eye exam with current eye doctor 01/09/23-Toni

## 2022-12-22 ENCOUNTER — Other Ambulatory Visit: Payer: Self-pay | Admitting: Nurse Practitioner

## 2023-01-09 DIAGNOSIS — H401131 Primary open-angle glaucoma, bilateral, mild stage: Secondary | ICD-10-CM | POA: Diagnosis not present

## 2023-01-09 DIAGNOSIS — Z9889 Other specified postprocedural states: Secondary | ICD-10-CM | POA: Diagnosis not present

## 2023-01-09 DIAGNOSIS — Z961 Presence of intraocular lens: Secondary | ICD-10-CM | POA: Diagnosis not present

## 2023-01-09 DIAGNOSIS — H26491 Other secondary cataract, right eye: Secondary | ICD-10-CM | POA: Diagnosis not present

## 2023-01-13 ENCOUNTER — Other Ambulatory Visit: Payer: Self-pay | Admitting: Nurse Practitioner

## 2023-01-13 DIAGNOSIS — E119 Type 2 diabetes mellitus without complications: Secondary | ICD-10-CM

## 2023-02-09 ENCOUNTER — Other Ambulatory Visit: Payer: Self-pay | Admitting: Nurse Practitioner

## 2023-02-09 ENCOUNTER — Other Ambulatory Visit: Payer: Self-pay | Admitting: Internal Medicine

## 2023-02-09 DIAGNOSIS — E1165 Type 2 diabetes mellitus with hyperglycemia: Secondary | ICD-10-CM | POA: Diagnosis not present

## 2023-02-09 DIAGNOSIS — E782 Mixed hyperlipidemia: Secondary | ICD-10-CM | POA: Diagnosis not present

## 2023-02-09 DIAGNOSIS — E559 Vitamin D deficiency, unspecified: Secondary | ICD-10-CM | POA: Diagnosis not present

## 2023-02-09 DIAGNOSIS — Z0001 Encounter for general adult medical examination with abnormal findings: Secondary | ICD-10-CM | POA: Diagnosis not present

## 2023-02-09 DIAGNOSIS — E538 Deficiency of other specified B group vitamins: Secondary | ICD-10-CM | POA: Diagnosis not present

## 2023-02-09 DIAGNOSIS — E785 Hyperlipidemia, unspecified: Secondary | ICD-10-CM

## 2023-02-10 LAB — COMPREHENSIVE METABOLIC PANEL
ALT: 18 IU/L (ref 0–32)
AST: 22 IU/L (ref 0–40)
Albumin: 4.1 g/dL (ref 3.9–4.9)
Alkaline Phosphatase: 86 IU/L (ref 44–121)
BUN/Creatinine Ratio: 13 (ref 12–28)
BUN: 10 mg/dL (ref 8–27)
Bilirubin Total: 0.4 mg/dL (ref 0.0–1.2)
CO2: 22 mmol/L (ref 20–29)
Calcium: 9.4 mg/dL (ref 8.7–10.3)
Chloride: 106 mmol/L (ref 96–106)
Creatinine, Ser: 0.79 mg/dL (ref 0.57–1.00)
Globulin, Total: 2.8 g/dL (ref 1.5–4.5)
Glucose: 94 mg/dL (ref 70–99)
Potassium: 4.7 mmol/L (ref 3.5–5.2)
Sodium: 142 mmol/L (ref 134–144)
Total Protein: 6.9 g/dL (ref 6.0–8.5)
eGFR: 80 mL/min/{1.73_m2} (ref >59)

## 2023-02-10 LAB — CBC WITH DIFFERENTIAL/PLATELET
Basophils Absolute: 0.1 10*3/uL (ref 0.0–0.2)
Basos: 1 %
EOS (ABSOLUTE): 0.2 10*3/uL (ref 0.0–0.4)
Eos: 3 %
Hematocrit: 39.5 % (ref 34.0–46.6)
Hemoglobin: 12.1 g/dL (ref 11.1–15.9)
Immature Grans (Abs): 0 10*3/uL (ref 0.0–0.1)
Immature Granulocytes: 0 %
Lymphocytes Absolute: 1.6 10*3/uL (ref 0.7–3.1)
Lymphs: 27 %
MCH: 26.2 pg — ABNORMAL LOW (ref 26.6–33.0)
MCHC: 30.6 g/dL — ABNORMAL LOW (ref 31.5–35.7)
MCV: 86 fL (ref 79–97)
Monocytes Absolute: 0.6 10*3/uL (ref 0.1–0.9)
Monocytes: 11 %
Neutrophils Absolute: 3.3 10*3/uL (ref 1.4–7.0)
Neutrophils: 58 %
Platelets: 286 10*3/uL (ref 150–450)
RBC: 4.62 x10E6/uL (ref 3.77–5.28)
RDW: 14.3 % (ref 11.7–15.4)
WBC: 5.8 10*3/uL (ref 3.4–10.8)

## 2023-02-10 LAB — VITAMIN D 25 HYDROXY (VIT D DEFICIENCY, FRACTURES): Vit D, 25-Hydroxy: 33.2 ng/mL (ref 30.0–100.0)

## 2023-02-10 LAB — LIPID PANEL
Chol/HDL Ratio: 2.8 ratio (ref 0.0–4.4)
Cholesterol, Total: 145 mg/dL (ref 100–199)
HDL: 51 mg/dL (ref >39)
LDL Chol Calc (NIH): 81 mg/dL (ref 0–99)
Triglycerides: 61 mg/dL (ref 0–149)
VLDL Cholesterol Cal: 13 mg/dL (ref 5–40)

## 2023-02-10 LAB — B12 AND FOLATE PANEL
Folate: 7.3 ng/mL (ref >3.0)
Vitamin B-12: 372 pg/mL (ref 232–1245)

## 2023-02-10 LAB — HGB A1C W/O EAG: Hgb A1c MFr Bld: 6.2 % — ABNORMAL HIGH (ref 4.8–5.6)

## 2023-02-12 ENCOUNTER — Ambulatory Visit (INDEPENDENT_AMBULATORY_CARE_PROVIDER_SITE_OTHER): Payer: PPO | Admitting: Nurse Practitioner

## 2023-02-12 ENCOUNTER — Encounter: Payer: Self-pay | Admitting: Nurse Practitioner

## 2023-02-12 VITALS — BP 136/78 | HR 74 | Temp 98.2°F | Resp 16 | Ht 67.0 in | Wt 198.4 lb

## 2023-02-12 DIAGNOSIS — E6609 Other obesity due to excess calories: Secondary | ICD-10-CM

## 2023-02-12 DIAGNOSIS — E1169 Type 2 diabetes mellitus with other specified complication: Secondary | ICD-10-CM

## 2023-02-12 DIAGNOSIS — Z6834 Body mass index (BMI) 34.0-34.9, adult: Secondary | ICD-10-CM

## 2023-02-12 DIAGNOSIS — I10 Essential (primary) hypertension: Secondary | ICD-10-CM

## 2023-02-12 DIAGNOSIS — E2839 Other primary ovarian failure: Secondary | ICD-10-CM

## 2023-02-12 DIAGNOSIS — Z0000 Encounter for general adult medical examination without abnormal findings: Secondary | ICD-10-CM | POA: Diagnosis not present

## 2023-02-12 DIAGNOSIS — K219 Gastro-esophageal reflux disease without esophagitis: Secondary | ICD-10-CM

## 2023-02-12 DIAGNOSIS — E119 Type 2 diabetes mellitus without complications: Secondary | ICD-10-CM

## 2023-02-12 DIAGNOSIS — Z1231 Encounter for screening mammogram for malignant neoplasm of breast: Secondary | ICD-10-CM | POA: Diagnosis not present

## 2023-02-12 DIAGNOSIS — E785 Hyperlipidemia, unspecified: Secondary | ICD-10-CM

## 2023-02-12 NOTE — Progress Notes (Signed)
[facility] [address] [place], [place] [address]  Internal MEDICINE  Office Visit Note  Patient Name: Holly Forbes  [id number]  [id number]  Date of Service: 02/12/2023  Chief Complaint  Patient presents with   Diabetes   Gastroesophageal Reflux   Hypertension   Medicare Wellness    HPI Jasel presents for an annual well visit and physical exam.  Well-appearing 72 y.o. female with  hypertension, diabetes, GERD, allergic rhinitis, and osteoarthritis.  Routine CRC screening: due in 2027 Routine mammogram: due for mammogram DEXA scan: due now Eye exam: due in February 2025 Labs: lab results reviewed with patient.  All labs were normal except for slightly low iron without anemia and slightly low normal B12 level.  A1c is 6.2 today which is improved from 6.9 in February this year New or worsening pain: none Other concerns: none     02/12/2023    9:44 AM 02/07/2022    9:11 AM 01/31/2021   11:31 AM  MMSE - Mini Mental State Exam  Orientation to time 5 5 5   Orientation to Place 5 5 5   Registration 3 3 3   Attention/ Calculation 5 5 5   Recall 3 3 3   Language- name 2 objects 2 2 2   Language- repeat 1 1 1   Language- follow 3 step command 3 3 3   Language- read & follow direction 1 1 1   Write a sentence 1 1 1   Copy design 1 1 1   Total score 30 30 30     Functional Status Survey: Is the patient deaf or have difficulty hearing?: No Does the patient have difficulty seeing, even when wearing glasses/contacts?: No Does the patient have difficulty concentrating, remembering, or making decisions?: No Does the patient have difficulty walking or climbing stairs?: No Does the patient have difficulty dressing or bathing?: No Does the patient have difficulty doing errands alone such as visiting a doctor's office or shopping?: No     07/15/2021    9:36 AM 10/17/2021    1:53 PM 02/07/2022    9:10 AM 06/19/2022   10:09 AM 02/12/2023    9:43 AM  Fall Risk  Falls in  the past year? 0 0 0 0 0  Was there an injury with Fall?   0 0 0  Fall Risk Category Calculator   0 0 0  Fall Risk Category (Retired)   Low    (RETIRED) Patient Fall Risk Level Low fall risk  Low fall risk    Patient at Risk for Falls Due to No Fall Risks  No Fall Risks No Fall Risks No Fall Risks  Fall risk Follow up Falls evaluation completed  Falls evaluation completed Falls evaluation completed Falls evaluation completed       02/12/2023    9:43 AM  Depression screen PHQ 2/9  Decreased Interest 0  Down, Depressed, Hopeless 0  PHQ - 2 Score 0        No data to display            Current Medication: Outpatient Encounter Medications as of 02/12/2023  Medication Sig   amLODipine (NORVASC) 2.5 MG tablet TAKE 1 TABLET BY MOUTH EVERY NIGHT AT BEDTIME FOR BLOOD PRESSURE   aspirin 81 MG tablet Take 81 mg by mouth daily.    Blood Glucose Monitoring Suppl (ONETOUCH VERIO FLEX SYSTEM) w/Device KIT Use as directed DX E11.65   Cholecalciferol (DIALYVITE VITAMIN D 5000) 125 MCG (5000 UT) capsule Take 5,000 Units by mouth daily.   cyanocobalamin  1000 MCG tablet Take 1,000 mcg by mouth daily.   dorzolamide-timolol (COSOPT) 22.3-6.8 MG/ML ophthalmic solution Place 1 drop into both eyes every evening.   furosemide (LASIX) 20 MG tablet TAKE 1 TABLET BY MOUTH DAILY   ibuprofen (ADVIL) 400 MG tablet Take one tab po bid prn for pain   Lancets (ONETOUCH DELICA PLUS LANCET30G) MISC USE AS DIRECTED ONCE DAILY   latanoprost (XALATAN) 0.005 % ophthalmic solution Place 1 drop into both eyes at bedtime.    lisinopril (ZESTRIL) 40 MG tablet TAKE 1 TABLET(40 MG) BY MOUTH DAILY   pantoprazole (PROTONIX) 20 MG tablet TAKE 1 TABLET BY MOUTH EVERY DAY   rosuvastatin (CRESTOR) 5 MG tablet TAKE 1 TABLET BY MOUTH DAILY   RYBELSUS 7 MG TABS TAKE 1 TABLET BY MOUTH EVERY DAY WITH BREAKFAST ON AN EMPTY STOMACH WITH NO MORE THAN 4 OZ OF WATER AND WAIT 30 MINUTES BEFORE EATING, DRINKING   [DISCONTINUED] ONETOUCH  VERIO test strip USE FOR TESTING ONCE DAILY AS DIRECTED.   Facility-Administered Encounter Medications as of 02/12/2023  Medication   triamcinolone acetonide (KENALOG) 10 MG/ML injection 10 mg    Surgical History: Past Surgical History:  Procedure Laterality Date   ABDOMINAL HYSTERECTOMY     BREAST EXCISIONAL BIOPSY Left 2015   cyst removed   CATARACT EXTRACTION W/PHACO Right 03/27/2020   Procedure: CATARACT EXTRACTION PHACO AND INTRAOCULAR LENS PLACEMENT (IOC) RIGHT 5.03 00:50.6 9.9%;  Surgeon: Lockie Mola, MD;  Location: [facility];  Service: Ophthalmology;  Laterality: Right;   CATARACT EXTRACTION W/PHACO Left 04/18/2020   Procedure: CATARACT EXTRACTION PHACO AND INTRAOCULAR LENS PLACEMENT (IOC) LEFT DIABETIC 4.56 00:51.7 8.8%;  Surgeon: Lockie Mola, MD;  Location: [facility] ORS;  Service: Ophthalmology;  Laterality: Left;   CHOLECYSTECTOMY     COLONOSCOPY N/A 09/25/2014   Procedure: COLONOSCOPY;  Surgeon: Midge Minium, MD;  Location: [facility];  Service: Gastroenterology;  Laterality: N/A;  cecum time- 1610   COLONOSCOPY WITH PROPOFOL N/A 07/16/2020   Procedure: COLONOSCOPY WITH PROPOFOL;  Surgeon: Toney Reil, MD;  Location: [facility] ENDOSCOPY;  Service: Endoscopy;  Laterality: N/A;   CYST REMOVAL NECK     ESOPHAGOGASTRODUODENOSCOPY N/A 09/25/2014   Procedure: ESOPHAGOGASTRODUODENOSCOPY (EGD);  Surgeon: Midge Minium, MD;  Location: [facility];  Service: Gastroenterology;  Laterality: N/A;   ESOPHAGOGASTRODUODENOSCOPY (EGD) WITH PROPOFOL N/A 04/07/2018   Procedure: ESOPHAGOGASTRODUODENOSCOPY (EGD) WITH PROPOFOL;  Surgeon: Toledo, Boykin Nearing, MD;  Location: [facility] ENDOSCOPY;  Service: Gastroenterology;  Laterality: N/A;   POLYPECTOMY  09/25/2014   Procedure: POLYPECTOMY INTESTINAL;  Surgeon: Midge Minium, MD;  Location: 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   Back pain    Left sciatica, low back pain   Cataract    Diabetes mellitus without complication (HCC)    GERD (gastroesophageal reflux disease)    Glaucoma    Hypertension    Vitamin D deficiency     Family History: Family History  Problem Relation Age of Onset   Heart disease Sister    Diabetes Sister    Hypertension Sister    Heart disease Brother    Diabetes Brother    Hypertension Brother    Breast cancer Daughter 56   Colon cancer Mother    Stroke Mother    Lung cancer Father     Social History   Socioeconomic History  Marital status: Married    Spouse name: Not on file   Number of children: Not on file   Years of education: Not on file   Highest education level: Not on file  Occupational History   Not on file  Tobacco Use   Smoking status: Former    Current packs/day: 0.00    Average packs/day: 0.3 packs/day for 15.0 years (3.8 ttl pk-yrs)    Types: Cigarettes    Start date: 19    Quit date: 36    Years since quitting: 29.9   Smokeless tobacco: Never  Vaping Use   Vaping status: Never Used  Substance and Sexual Activity   Alcohol use: Yes    Comment: 3-5 glasses wine/month   Drug use: Not Currently    Types: Marijuana    Comment: previous many years ago, during young adulthood.   Sexual activity: Not on file  Other Topics Concern   Not on file  Social History Narrative   Not on file   Social Determinants of Health   Financial Resource Strain: Low Risk  (02/12/2023)   Overall Financial Resource Strain (CARDIA)    Difficulty of Paying Living Expenses: Not hard at all  Food Insecurity: No Food Insecurity (02/12/2023)   Hunger Vital Sign    Worried About Running Out of Food in the Last Year: Never true    Ran Out of Food in the Last Year: Never true  Transportation Needs: No Transportation Needs (02/12/2023)   PRAPARE - Administrator, Civil Service (Medical): No    Lack of Transportation  (Non-Medical): No  Physical Activity: Insufficiently Active (02/12/2023)   Exercise Vital Sign    Days of Exercise per Week: 2 days    Minutes of Exercise per Session: 30 min  Stress: Stress Concern Present (02/12/2023)   [employer] of Occupational Health - Occupational Stress Questionnaire    Feeling of Stress : To some extent  Social Connections: Moderately Integrated (02/12/2023)   Social Connection and Isolation Panel [NHANES]    Frequency of Communication with Friends and Family: More than three times a week    Frequency of Social Gatherings with Friends and Family: Once a week    Attends Religious Services: More than 4 times per year    Active Member of [employer] or Organizations: No    Attends Banker Meetings: Never    Marital Status: Married  Recent Concern: Social Connections - Moderately Isolated (02/12/2023)   Social Connection and Isolation Panel [NHANES]    Frequency of Communication with Friends and Family: More than three times a week    Frequency of Social Gatherings with Friends and Family: Once a week    Attends Religious Services: Never    Database administrator or Organizations: No    Attends Banker Meetings: Never    Marital Status: Married  Catering manager Violence: Not At Risk (02/12/2023)   Humiliation, Afraid, Rape, and Kick questionnaire    Fear of Current or Ex-Partner: No    Emotionally Abused: No    Physically Abused: No    Sexually Abused: No      Review of Systems  Vital Signs: BP 136/78   Pulse 74   Temp 98.2 F (36.8 C)   Resp 16   Ht 5\' 7"  (1.702 m)   Wt 198 lb 6.4 oz (90 kg)   LMP  (LMP Unknown)   SpO2 97%   BMI 31.07 kg/m    Physical Exam  Assessment/Plan: 1. Encounter for subsequent annual wellness visit (AWV) in Medicare patient Age-appropriate preventive screenings and vaccinations discussed, annual physical exam completed. Routine labs for health maintenance results discussed with patient  today. PHM updated.   2. Type 2 diabetes mellitus with other specified complication, without long-term current use of insulin (HCC) Continue rybelsus as prescribed   3. Hyperlipidemia associated with type 2 diabetes mellitus (HCC) Continue rosuvastatin as prescribed.   4. Essential hypertension Continue medications as prescribed.   5. Ovarian failure due to menopause Dexa scan ordered  - DG Bone Density; Future  6. Class 1 obesity due to excess calories with serious comorbidity and body mass index (BMI) of 34.0 to 34.9 in adult Continue diet and lifestyle modifications as discussed  7. Encounter for screening mammogram for malignant neoplasm of breast Mammogram ordered  - MM 3D SCREENING MAMMOGRAM BILATERAL BREAST; Future    General Counseling: Auroara verbalizes understanding of the findings of todays visit and agrees with plan of treatment. I have discussed any further diagnostic evaluation that may be needed or ordered today. We also reviewed her medications today. she has been encouraged to call the office with any questions or concerns that should arise related to todays visit.    Orders Placed This Encounter  Procedures   MM 3D SCREENING MAMMOGRAM BILATERAL BREAST   DG Bone Density    No orders of the defined types were placed in this encounter.   Return in about 6 months (around 08/12/2023) for F/U, Recheck A1C, Reka Wist PCP.   Total time spent:30 Minutes Time spent includes review of chart, medications, test results, and follow up plan with the patient.   [place] Controlled Substance Database was reviewed by me.  This patient was seen by Sallyanne Kuster, FNP-C in collaboration with Dr. Beverely Risen as a part of collaborative care agreement.  Modell Fendrick R. Tedd Sias, MSN, FNP-C Internal medicine

## 2023-02-13 DIAGNOSIS — E2839 Other primary ovarian failure: Secondary | ICD-10-CM | POA: Diagnosis not present

## 2023-02-13 DIAGNOSIS — M8588 Other specified disorders of bone density and structure, other site: Secondary | ICD-10-CM | POA: Diagnosis not present

## 2023-02-13 DIAGNOSIS — M859 Disorder of bone density and structure, unspecified: Secondary | ICD-10-CM | POA: Diagnosis not present

## 2023-02-13 DIAGNOSIS — Z1231 Encounter for screening mammogram for malignant neoplasm of breast: Secondary | ICD-10-CM | POA: Diagnosis not present

## 2023-02-13 DIAGNOSIS — Z13820 Encounter for screening for osteoporosis: Secondary | ICD-10-CM | POA: Diagnosis not present

## 2023-03-24 ENCOUNTER — Other Ambulatory Visit: Payer: Self-pay | Admitting: Nurse Practitioner

## 2023-04-07 ENCOUNTER — Encounter: Payer: Self-pay | Admitting: Nurse Practitioner

## 2023-04-07 ENCOUNTER — Ambulatory Visit (INDEPENDENT_AMBULATORY_CARE_PROVIDER_SITE_OTHER): Payer: PPO | Admitting: Nurse Practitioner

## 2023-04-07 VITALS — BP 138/88 | HR 69 | Temp 98.0°F | Resp 16 | Ht 67.0 in | Wt 197.4 lb

## 2023-04-07 DIAGNOSIS — M7021 Olecranon bursitis, right elbow: Secondary | ICD-10-CM | POA: Diagnosis not present

## 2023-04-07 DIAGNOSIS — E1169 Type 2 diabetes mellitus with other specified complication: Secondary | ICD-10-CM

## 2023-04-07 NOTE — Progress Notes (Unsigned)
[facility] [address] [place], [place] [address]  Internal MEDICINE  Office Visit Note  Patient Name: Holly Forbes  [id number]  [id number]  Date of Service: 04/07/2023  Chief Complaint  Patient presents with  . Acute Visit    Knot on right elbow     HPI Holly Forbes presents for an acute sick visit for knot on right elbow --onset was 2 weeks ago Holly Forbes of fluid -- bursitis of the right elbow   Also due for urine microalbumin/creatinine ratio. Will send urine.       Current Medication:  Outpatient Encounter Medications as of 04/07/2023  Medication Sig  . amLODipine (NORVASC) 2.5 MG tablet TAKE 1 TABLET BY MOUTH EVERY NIGHT AT BEDTIME FOR BLOOD PRESSURE  . aspirin 81 MG tablet Take 81 mg by mouth daily.   . Blood Glucose Monitoring Suppl (ONETOUCH VERIO FLEX SYSTEM) w/Device KIT Use as directed DX E11.65  . Cholecalciferol (DIALYVITE VITAMIN D 5000) 125 MCG (5000 UT) capsule Take 5,000 Units by mouth daily.  . cyanocobalamin 1000 MCG tablet Take 1,000 mcg by mouth daily.  . dorzolamide-timolol (COSOPT) 22.3-6.8 MG/ML ophthalmic solution Place 1 drop into both eyes every evening.  . furosemide (LASIX) 20 MG tablet TAKE 1 TABLET BY MOUTH DAILY  . ibuprofen (ADVIL) 400 MG tablet Take one tab po bid prn for pain  . Lancets (ONETOUCH DELICA PLUS LANCET30G) MISC USE AS DIRECTED ONCE DAILY  . latanoprost (XALATAN) 0.005 % ophthalmic solution Place 1 drop into both eyes at bedtime.   Marland Kitchen lisinopril (ZESTRIL) 40 MG tablet TAKE 1 TABLET(40 MG) BY MOUTH DAILY  . ONETOUCH VERIO test strip USE FOR TESTING ONCE DAILY AS DIRECTED  . pantoprazole (PROTONIX) 20 MG tablet TAKE 1 TABLET BY MOUTH EVERY DAY  . rosuvastatin (CRESTOR) 5 MG tablet TAKE 1 TABLET BY MOUTH DAILY  . RYBELSUS 7 MG TABS TAKE 1 TABLET BY MOUTH EVERY DAY WITH BREAKFAST ON AN EMPTY STOMACH WITH NO MORE THAN 4 OZ OF WATER AND WAIT 30 MINUTES BEFORE EATING, DRINKING   Facility-Administered Encounter  Medications as of 04/07/2023  Medication  . triamcinolone acetonide (KENALOG) 10 MG/ML injection 10 mg      Medical History: Past Medical History:  Diagnosis Date  . Abdominal pain   . 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 Back pain    Left sciatica, low back pain  . Cataract   . Diabetes mellitus without complication (HCC)   . GERD (gastroesophageal reflux disease)   . Glaucoma   . Hypertension   . Vitamin D deficiency      Vital Signs: BP 138/88   Pulse 69   Temp 98 F (36.7 C)   Resp 16   Ht 5\' 7"  (1.702 m)   Wt 197 lb 6.4 oz (89.5 kg)   LMP  (LMP Unknown)   SpO2 98%   BMI 30.92 kg/m    Review of Systems  Physical Exam    Assessment/Plan: 1. Olecranon bursitis of right elbow Aspiration of the bursa of the right olecranon performed. 2.5 ml of serous fluid removed. Pressure dressing with gauze and wrap applied - PR ARTHROCENTESIS ASPIR&/INJ INTERM JT/BURS W/O [place]  2. Type 2 diabetes mellitus with other specified complication, without long-term current use of insulin (HCC) *** - Urine Microalbumin w/creat. ratio   General Counseling: Holly Forbes understanding of the findings of todays visit and agrees with plan of treatment. I have discussed any further diagnostic evaluation that may  be needed or ordered today. We also reviewed her medications today. she has been encouraged to call the office with any questions or concerns that should arise related to todays visit.    Counseling:    No orders of the defined types were placed in this encounter.   No orders of the defined types were placed in this encounter.   No follow-ups on file.  [place] Controlled Substance Database was reviewed by me for overdose risk score (ORS)  Time spent:30 Minutes Time spent with patient included reviewing progress notes, labs, imaging studies, and discussing plan for follow up.   This patient was seen by Sallyanne Kuster, FNP-C in collaboration with Dr. Beverely Risen as a part of collaborative care agreement.  Rosamary Boudreau R. Tedd Sias, MSN, FNP-C Internal Medicine

## 2023-04-08 LAB — MICROALBUMIN / CREATININE URINE RATIO
Creatinine, Urine: 25.7 mg/dL
Microalb/Creat Ratio: <12 mg/g{creat} (ref 0–29)
Microalbumin, Urine: <3 ug/mL

## 2023-04-16 ENCOUNTER — Other Ambulatory Visit: Payer: Self-pay | Admitting: Nurse Practitioner

## 2023-04-16 DIAGNOSIS — E119 Type 2 diabetes mellitus without complications: Secondary | ICD-10-CM

## 2023-04-29 ENCOUNTER — Telehealth: Payer: Self-pay

## 2023-04-29 NOTE — Telephone Encounter (Signed)
[facility] Med Adherence Report: Spoke with patient about her Rybelsus 7mg . Patient is taking prescribed amount as directed. Stressed the importance of medication adherence to the patient. Patient confirmed that she has enough and has just picked up her refill.

## 2023-06-23 ENCOUNTER — Other Ambulatory Visit: Payer: Self-pay | Admitting: Nurse Practitioner

## 2023-06-23 DIAGNOSIS — I10 Essential (primary) hypertension: Secondary | ICD-10-CM

## 2023-06-23 DIAGNOSIS — K219 Gastro-esophageal reflux disease without esophagitis: Secondary | ICD-10-CM

## 2023-06-24 ENCOUNTER — Other Ambulatory Visit: Payer: Self-pay | Admitting: Nurse Practitioner

## 2023-06-24 DIAGNOSIS — H401131 Primary open-angle glaucoma, bilateral, mild stage: Secondary | ICD-10-CM | POA: Diagnosis not present

## 2023-06-24 DIAGNOSIS — K219 Gastro-esophageal reflux disease without esophagitis: Secondary | ICD-10-CM

## 2023-07-06 DIAGNOSIS — H26491 Other secondary cataract, right eye: Secondary | ICD-10-CM | POA: Diagnosis not present

## 2023-07-06 DIAGNOSIS — E119 Type 2 diabetes mellitus without complications: Secondary | ICD-10-CM | POA: Diagnosis not present

## 2023-07-06 DIAGNOSIS — H401131 Primary open-angle glaucoma, bilateral, mild stage: Secondary | ICD-10-CM | POA: Diagnosis not present

## 2023-07-06 DIAGNOSIS — Z961 Presence of intraocular lens: Secondary | ICD-10-CM | POA: Diagnosis not present

## 2023-08-13 ENCOUNTER — Ambulatory Visit (INDEPENDENT_AMBULATORY_CARE_PROVIDER_SITE_OTHER): Payer: PPO | Admitting: Nurse Practitioner

## 2023-08-13 ENCOUNTER — Other Ambulatory Visit: Payer: Self-pay | Admitting: Nurse Practitioner

## 2023-08-13 ENCOUNTER — Telehealth: Payer: Self-pay | Admitting: Nurse Practitioner

## 2023-08-13 ENCOUNTER — Encounter: Payer: Self-pay | Admitting: Nurse Practitioner

## 2023-08-13 VITALS — BP 128/76 | HR 74 | Temp 98.1°F | Resp 16 | Ht 67.0 in | Wt 204.8 lb

## 2023-08-13 DIAGNOSIS — R040 Epistaxis: Secondary | ICD-10-CM | POA: Diagnosis not present

## 2023-08-13 DIAGNOSIS — K581 Irritable bowel syndrome with constipation: Secondary | ICD-10-CM | POA: Diagnosis not present

## 2023-08-13 DIAGNOSIS — R0981 Nasal congestion: Secondary | ICD-10-CM

## 2023-08-13 DIAGNOSIS — E1169 Type 2 diabetes mellitus with other specified complication: Secondary | ICD-10-CM | POA: Diagnosis not present

## 2023-08-13 DIAGNOSIS — I10 Essential (primary) hypertension: Secondary | ICD-10-CM

## 2023-08-13 DIAGNOSIS — E785 Hyperlipidemia, unspecified: Secondary | ICD-10-CM | POA: Diagnosis not present

## 2023-08-13 LAB — POCT GLYCOSYLATED HEMOGLOBIN (HGB A1C): Hemoglobin A1C: 5.9 % — AB (ref 4.0–5.6)

## 2023-08-13 MED ORDER — ROSUVASTATIN CALCIUM 5 MG PO TABS: 5.0000 mg | ORAL_TABLET | Freq: Every day | ORAL | 3 refills | Status: AC

## 2023-08-13 MED ORDER — TRULANCE 3 MG PO TABS: 3.0000 mg | ORAL_TABLET | Freq: Every day | ORAL | 4 refills | Status: DC

## 2023-08-13 MED ORDER — LISINOPRIL 40 MG PO TABS: ORAL_TABLET | ORAL | 3 refills | Status: AC

## 2023-08-13 NOTE — Telephone Encounter (Signed)
 Otolaryngology referral sent via [facility] to [facility].  Lvm notifying patient. Gave telephone# [phone]

## 2023-08-13 NOTE — Progress Notes (Signed)
 [facility] [address] [place], [place] [address]  Internal MEDICINE  Office Visit Note  Patient Name: Holly Forbes  [id number]  [id number]  Date of Service: 08/13/2023  Chief Complaint  Patient presents with   Diabetes   Gastroesophageal Reflux   Hypertension   Follow-up    HPI Quanna presents for a follow-up visit for diabetes, high cholesterol, hypertension and nosebleeds and constipation.  Diabetes -- A1c continues to improve, down to 5.9, taking rybelsus  Hypertension -- controlled with amlodipine, lisinopril and furosemide High cholesterol -- taking rosuvastatin  Left nasal congestion and left sided nosebleed -- blowing nose with bloody drainage and blood clots and had 1 spontaneous nosebleed as well  Constipation -- Taking generic fiber supplement which works better for her than the brand name metamucil. Has tried colace and a tea called smooth move as well.    Current Medication: Outpatient Encounter Medications as of 08/13/2023  Medication Sig   amLODipine (NORVASC) 2.5 MG tablet TAKE 1 TABLET BY MOUTH EVERY NIGHT AT BEDTIME FOR BLOOD PRESSURE   aspirin 81 MG tablet Take 81 mg by mouth daily.    Blood Glucose Monitoring Suppl (ONETOUCH VERIO FLEX SYSTEM) w/Device KIT Use as directed DX E11.65   Cholecalciferol (DIALYVITE VITAMIN D 5000) 125 MCG (5000 UT) capsule Take 5,000 Units by mouth daily.   cyanocobalamin 1000 MCG tablet Take 1,000 mcg by mouth daily.   dorzolamide-timolol (COSOPT) 22.3-6.8 MG/ML ophthalmic solution Place 1 drop into both eyes every evening.   furosemide (LASIX) 20 MG tablet TAKE 1 TABLET BY MOUTH DAILY   ibuprofen (ADVIL) 400 MG tablet Take one tab po bid prn for pain   Lancets (ONETOUCH DELICA PLUS LANCET30G) MISC USE AS DIRECTED ONCE DAILY   latanoprost (XALATAN) 0.005 % ophthalmic solution Place 1 drop into both eyes at bedtime.    ONETOUCH VERIO test strip USE FOR TESTING ONCE DAILY AS DIRECTED   pantoprazole  (PROTONIX) 20 MG tablet TAKE 1 TABLET BY MOUTH EVERY DAY   Plecanatide (TRULANCE) 3 MG TABS Take 1 tablet (3 mg total) by mouth daily.   Semaglutide (RYBELSUS) 7 MG TABS TAKE 1 TABLET BY MOUTH EVERY DAY WITH BREAKFAST ON AN EMPTY STOMACH WITH NO MORE THAN 4 OZ OF WATER AND WAIT 30 MINUTES BEFORE EATING, DRINKING   [DISCONTINUED] lisinopril (ZESTRIL) 40 MG tablet TAKE 1 TABLET(40 MG) BY MOUTH DAILY   [DISCONTINUED] rosuvastatin (CRESTOR) 5 MG tablet TAKE 1 TABLET BY MOUTH DAILY   lisinopril (ZESTRIL) 40 MG tablet TAKE 1 TABLET(40 MG) BY MOUTH DAILY   rosuvastatin (CRESTOR) 5 MG tablet Take 1 tablet (5 mg total) by mouth daily.   [DISCONTINUED] triamcinolone acetonide (KENALOG) 10 MG/ML injection 10 mg    No facility-administered encounter medications on file as of 08/13/2023.    Surgical History: Past Surgical History:  Procedure Laterality Date   ABDOMINAL HYSTERECTOMY     BREAST EXCISIONAL BIOPSY Left 2015   cyst removed   CATARACT EXTRACTION W/PHACO Right 03/27/2020   Procedure: CATARACT EXTRACTION PHACO AND INTRAOCULAR LENS PLACEMENT (IOC) RIGHT 5.03 00:50.6 9.9%;  Surgeon: Lockie Mola, MD;  Location: [facility];  Service: Ophthalmology;  Laterality: Right;   CATARACT EXTRACTION W/PHACO Left 04/18/2020   Procedure: CATARACT EXTRACTION PHACO AND INTRAOCULAR LENS PLACEMENT (IOC) LEFT DIABETIC 4.56 00:51.7 8.8%;  Surgeon: Lockie Mola, MD;  Location: [facility];  Service: Ophthalmology;  Laterality: Left;   CHOLECYSTECTOMY     COLONOSCOPY N/A 09/25/2014   Procedure: COLONOSCOPY;  Surgeon: Midge Minium,  MD;  Location: [facility];  Service: Gastroenterology;  Laterality: N/A;  cecum time- 4098   COLONOSCOPY WITH PROPOFOL N/A 07/16/2020   Procedure: COLONOSCOPY WITH PROPOFOL;  Surgeon: Toney Reil, MD;  Location: [facility];  Service: Endoscopy;  Laterality: N/A;   CYST REMOVAL NECK     ESOPHAGOGASTRODUODENOSCOPY N/A 09/25/2014   Procedure:  ESOPHAGOGASTRODUODENOSCOPY (EGD);  Surgeon: Midge Minium, MD;  Location: [facility];  Service: Gastroenterology;  Laterality: N/A;   ESOPHAGOGASTRODUODENOSCOPY (EGD) WITH PROPOFOL N/A 04/07/2018   Procedure: ESOPHAGOGASTRODUODENOSCOPY (EGD) WITH PROPOFOL;  Surgeon: Toledo, Boykin Nearing, MD;  Location: [facility];  Service: Gastroenterology;  Laterality: N/A;   POLYPECTOMY  09/25/2014   Procedure: POLYPECTOMY INTESTINAL;  Surgeon: Midge Minium, MD;  Location: [facility];  Service: Gastroenterology;;    Medical History: 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   Back pain    Left sciatica, low back pain   Cataract    Diabetes mellitus without complication (HCC)    GERD (gastroesophageal reflux disease)    Glaucoma    Hypertension    Vitamin D deficiency     Family History: Family History  Problem Relation Age of Onset   Heart disease Sister    Diabetes Sister    Hypertension Sister    Heart disease Brother    Diabetes Brother    Hypertension Brother    Breast cancer Daughter 87   Colon cancer Mother    Stroke Mother    Lung cancer Father     Social History   Socioeconomic History   Marital status: Married    Spouse name: Not on file   Number of children: Not on file   Years of education: Not on file   Highest education level: Not on file  Occupational History   Not on file  Tobacco Use   Smoking status: Former    Current packs/day: 0.00    Average packs/day: 0.3 packs/day for 15.0 years (3.8 ttl pk-yrs)    Types: Cigarettes    Start date: 24    Quit date: 1995    Years since quitting: 30.2   Smokeless tobacco: Never  Vaping Use   Vaping status: Never Used  Substance and Sexual Activity   Alcohol use: Yes    Comment: 3-5 glasses wine/month   Drug use: Not Currently    Types: Marijuana    Comment: previous many years ago, during young adulthood.   Sexual activity: Not on file  Other  Topics Concern   Not on file  Social History Narrative   Not on file   Social Drivers of Health   Financial Resource Strain: Low Risk  (02/12/2023)   Overall Financial Resource Strain (CARDIA)    Difficulty of Paying Living Expenses: Not hard at all  Food Insecurity: No Food Insecurity (02/12/2023)   Hunger Vital Sign    Worried About Running Out of Food in the Last Year: Never true    Ran Out of Food in the Last Year: Never true  Transportation Needs: No Transportation Needs (02/12/2023)   PRAPARE - Administrator, Civil Service (Medical): No    Lack of Transportation (Non-Medical): No  Physical Activity: Insufficiently Active (02/12/2023)   Exercise Vital Sign    Days of Exercise per Week: 2 days    Minutes of Exercise per Session: 30 min  Stress: Stress Concern Present (02/12/2023)   [employer]  of Occupational Health - Occupational Stress Questionnaire    Feeling of Stress : To some extent  Social Connections: Moderately Integrated (02/12/2023)   Social Connection and Isolation Panel [NHANES]    Frequency of Communication with Friends and Family: More than three times a week    Frequency of Social Gatherings with Friends and Family: Once a week    Attends Religious Services: More than 4 times per year    Active Member of [employer] or Organizations: No    Attends Banker Meetings: Never    Marital Status: Married  Recent Concern: Social Connections - Moderately Isolated (02/12/2023)   Social Connection and Isolation Panel [NHANES]    Frequency of Communication with Friends and Family: More than three times a week    Frequency of Social Gatherings with Friends and Family: Once a week    Attends Religious Services: Never    Database administrator or Organizations: No    Attends Banker Meetings: Never    Marital Status: Married  Catering manager Violence: Not At Risk (02/12/2023)   Humiliation, Afraid, Rape, and Kick questionnaire    Fear of  Current or Ex-Partner: No    Emotionally Abused: No    Physically Abused: No    Sexually Abused: No      Review of Systems  Constitutional:  Negative for chills, fatigue and unexpected weight change.  HENT:  Negative for congestion, postnasal drip, rhinorrhea, sneezing and sore throat.   Eyes:  Negative for redness.  Respiratory:  Negative for cough, chest tightness and shortness of breath.   Cardiovascular:  Negative for chest pain and palpitations.  Gastrointestinal:  Negative for abdominal pain, constipation, diarrhea, nausea and vomiting.  Genitourinary:  Negative for dysuria and frequency.  Musculoskeletal:  Negative for arthralgias, back pain, joint swelling and neck pain.  Skin:  Negative for rash.  Neurological: Negative.  Negative for tremors and numbness.  Hematological:  Negative for adenopathy. Does not bruise/bleed easily.  Psychiatric/Behavioral:  Negative for behavioral problems (Depression), sleep disturbance and suicidal ideas. The patient is not nervous/anxious.     Vital Signs: BP 128/76   Pulse 74   Temp 98.1 F (36.7 C)   Resp 16   Ht 5\' 7"  (1.702 m)   Wt 204 lb 12.8 oz (92.9 kg)   LMP  (LMP Unknown)   SpO2 98%   BMI 32.08 kg/m    Physical Exam Vitals reviewed.  Constitutional:      Appearance: Normal appearance.  HENT:     Head: Normocephalic and atraumatic.     Nose: Nose normal.     Mouth/Throat:     Mouth: Mucous membranes are moist.     Pharynx: No posterior oropharyngeal erythema.  Eyes:     Extraocular Movements: Extraocular movements intact.     Pupils: Pupils are equal, round, and reactive to light.  Cardiovascular:     Pulses: Normal pulses.     Heart sounds: Normal heart sounds.  Pulmonary:     Effort: Pulmonary effort is normal. No respiratory distress.     Breath sounds: Normal breath sounds.  Neurological:     General: No focal deficit present.     Mental Status: She is alert and oriented to person, place, and time.   Psychiatric:        Mood and Affect: Mood normal.        Behavior: Behavior normal.        Assessment/Plan: 1. Type 2 diabetes mellitus with  other specified complication, without long-term current use of insulin (HCC) (Primary) A1c continues to improve to 5.9, continue rybelsus as prescribed.  - POCT glycosylated hemoglobin (Hb A1C)  2. Essential hypertension Continue lisinopril, amlodipine and furosemide as prescribed.  - lisinopril (ZESTRIL) 40 MG tablet; TAKE 1 TABLET(40 MG) BY MOUTH DAILY  Dispense: 90 tablet; Refill: 3  3. Hyperlipidemia associated with type 2 diabetes mellitus (HCC) Continue rosuvastatin as prescribed  - rosuvastatin (CRESTOR) 5 MG tablet; Take 1 tablet (5 mg total) by mouth daily.  Dispense: 90 tablet; Refill: 3  4. Irritable bowel syndrome with constipation Will try trulance and see if it will be approved.  - Plecanatide (TRULANCE) 3 MG TABS; Take 1 tablet (3 mg total) by mouth daily.  Dispense: 30 tablet; Refill: 4  5. Left-sided epistaxis Referred to ENT - Ambulatory referral to ENT  6. Chronic nasal congestion Referred to ENT  - Ambulatory referral to ENT   General Counseling: izel hochberg understanding of the findings of todays visit and agrees with plan of treatment. I have discussed any further diagnostic evaluation that may be needed or ordered today. We also reviewed her medications today. she has been encouraged to call the office with any questions or concerns that should arise related to todays visit.    Orders Placed This Encounter  Procedures   Ambulatory referral to ENT   POCT glycosylated hemoglobin (Hb A1C)    Meds ordered this encounter  Medications   lisinopril (ZESTRIL) 40 MG tablet    Sig: TAKE 1 TABLET(40 MG) BY MOUTH DAILY    Dispense:  90 tablet    Refill:  3   rosuvastatin (CRESTOR) 5 MG tablet    Sig: Take 1 tablet (5 mg total) by mouth daily.    Dispense:  90 tablet    Refill:  3   Plecanatide (TRULANCE)  3 MG TABS    Sig: Take 1 tablet (3 mg total) by mouth daily.    Dispense:  30 tablet    Refill:  4    Fill new script, please send prior authorization if required.    Return for previously scheduled, AWV, Corliss Coggeshall PCP in october. .   Total time spent:30 Minutes Time spent includes review of chart, medications, test results, and follow up plan with the patient.   [place] Controlled Substance Database was reviewed by me.  This patient was seen by Sallyanne Kuster, FNP-C in collaboration with Dr. Beverely Risen as a part of collaborative care agreement.   Joylynn Defrancesco R. Tedd Sias, MSN, FNP-C Internal medicine

## 2023-08-14 ENCOUNTER — Other Ambulatory Visit: Payer: Self-pay | Admitting: Nurse Practitioner

## 2023-08-14 DIAGNOSIS — E1169 Type 2 diabetes mellitus with other specified complication: Secondary | ICD-10-CM

## 2023-08-14 DIAGNOSIS — I10 Essential (primary) hypertension: Secondary | ICD-10-CM

## 2023-08-24 ENCOUNTER — Telehealth: Payer: Self-pay | Admitting: Nurse Practitioner

## 2023-08-24 NOTE — Telephone Encounter (Signed)
 Otolaryngology appointment 08/27/2023 @ [facility]

## 2023-08-27 DIAGNOSIS — J310 Chronic rhinitis: Secondary | ICD-10-CM | POA: Diagnosis not present

## 2023-08-27 DIAGNOSIS — R490 Dysphonia: Secondary | ICD-10-CM | POA: Diagnosis not present

## 2023-08-27 DIAGNOSIS — R040 Epistaxis: Secondary | ICD-10-CM | POA: Diagnosis not present

## 2023-10-08 ENCOUNTER — Other Ambulatory Visit: Payer: Self-pay | Admitting: Nurse Practitioner

## 2023-10-12 ENCOUNTER — Other Ambulatory Visit: Payer: Self-pay | Admitting: Nurse Practitioner

## 2023-10-20 ENCOUNTER — Telehealth: Payer: Self-pay

## 2023-10-20 NOTE — Progress Notes (Unsigned)
   10/20/2023  Patient ID: Holly Forbes, female   DOB: 1951-01-05, 73 y.o.   MRN: [id number]  This patient is appearing on a report for being at risk of failing the adherence measure for identified medications this calendar year.   Medication Adherence Summary (STAR/HEDIS Monitoring): Adherence Category: diabetes    Drug Name: Rybelsus  7 mg    Last Fill or Sold Date:sold  09/29/2023 Days' Supply: 30    Notes: ? Adherence data pulled from pharmacy claims portal Dr. Anson Basta. ? Reviewed barriers to adherence: N/A. ? Plan: Follow for next fill   Alexandria Angel, PharmD Clinical Pharmacist Cell: [phone]

## 2023-10-26 ENCOUNTER — Telehealth: Payer: Self-pay

## 2023-10-26 DIAGNOSIS — Z79899 Other long term (current) drug therapy: Secondary | ICD-10-CM

## 2023-10-26 NOTE — Progress Notes (Signed)
   10/26/2023  Patient ID: Holly Forbes, female   DOB: [birth year], 73 y.o.   MRN: [id number]  This patient is appearing on a report for being at risk of failing the adherence measure for identified medications this calendar year.   Medication Adherence Summary (STAR/HEDIS Monitoring): Adherence Category: diabetes    Drug Name: Rybelsus  7 mg   Last Fill or Sold Date:Last sold 09/29/2023 Days' Supply: 30     Notes: ? Adherence data pulled from pharmacy claims portal Dr. Anson Basta. Patient uses an alarm to help with adherence and has no concerns with compliance. Patient declined offer from pharmacist to refill medication. However, she reported that she pays $47/ month for Rybelsus  and stated that she is concerned about the cost going up due to > $200+ Medicare coverage gap. Patient was informed of the changes with Medicare. In the past years, she stated that she paid $200+ per month for Rybelsus  and is interested in medication assistance. Per reported combined income, patient meets qualification for medication assistance.  ? Reviewed barriers to adherence: cost. Patient reports that she remains compliant and evident by her adherence rate in Dr. Anson Basta.  ? Plan: Will collaborate with CPhT regarding medication assistance.   Alexandria Angel, PharmD Clinical Pharmacist Cell: [phone]

## 2023-10-27 ENCOUNTER — Other Ambulatory Visit: Payer: Self-pay

## 2023-11-09 ENCOUNTER — Other Ambulatory Visit: Payer: Self-pay

## 2023-11-09 NOTE — Progress Notes (Signed)
   11/09/2023  Patient ID: Holly Forbes, female   DOB: 1950/05/31, 73 y.o.   MRN: [id number]  Pharmacy technician Herchel was on vacation last week and heard back from her today. Clarification provided on workflow of PAP applications sent to her requiring a prescription sent to their pharmacy.    Dorcas Solian, PharmD Clinical Pharmacist Cell: [phone]

## 2023-11-16 NOTE — Progress Notes (Addendum)
   11/16/2023  Patient ID: Holly Forbes, female   DOB: July 30, 1950, 73 y.o.   MRN: [id number]  This patient is appearing on a report for being at risk of failing the adherence measure for identified medications this calendar year.   Medication Adherence Summary (STAR/HEDIS Monitoring): Adherence Category: diabetes    Drug Name: Rybelsus  7 mg   Last Fill or Sold Date:Last sold 10/26/2023 - Patient is due for a refill soon. Days' Supply: 30     Notes: ? Adherence data pulled from pharmacy claims portal Dr. Annemarie. PAP application initiated by [facility], but practice is overseeing the medication assistance application process.   ? Reviewed barriers to adherence: cost, per previous discussion with patient. Patient reports that she remains compliant and evident by her adherence rate in Dr. Annemarie.  ? Plan: Will follow up on July/August fill history or until patient is approved for medication assistance.   Dorcas Solian, PharmD Clinical Pharmacist Cell: [phone]

## 2023-11-30 ENCOUNTER — Telehealth: Payer: Self-pay

## 2023-11-30 NOTE — Progress Notes (Signed)
   11/30/2023  Patient ID: Holly Forbes, female   DOB: [birth year], 73 y.o.   MRN: [id number]  This patient is appearing on a report for being at risk of failing the adherence measure for identified medications this calendar year.    Medication Adherence Summary (STAR/HEDIS Monitoring): Adherence Category: diabetes    Drug Name: Rybelsus  7 mg               Last Fill or Sold Date:sold  10/26/2023 Days' Supply: 30      Notes: Adherence data pulled from pharmacy claims portal Dr. Annemarie.   Patient outreached since Rybelsus  has not been filled if she was approved for medication assistance.   Will contact clinical staff regarding update of medication assistance application.   Dorcas Solian, PharmD Clinical Pharmacist Cell: [phone]

## 2023-12-02 ENCOUNTER — Telehealth: Payer: Self-pay

## 2023-12-02 NOTE — Progress Notes (Addendum)
   12/02/2023  Patient ID: Holly Forbes, female   DOB: 1950/11/08, 73 y.o.   MRN: [id number]  This patient is appearing on a report for being at risk of failing the adherence measure for identified medications this calendar year.    Medication Adherence Summary (STAR/HEDIS Monitoring): Adherence Category: diabetes    Drug Name: Rybelsus  7 mg               Last Filled: 11/25/2023 (sold 11/30/23) Days' Supply: 30      Notes: Patient called back and states that Rybelsus  is now free and that she does not recall receiving medication assistance application for Rybelsus . Although patient's copay is $0, she would like to continue with medication assistance to be in their system pending if application has already been submitted. If not enrolled this year, she would like consideration for enrollment next year.     Dorcas Solian, PharmD Clinical Pharmacist Cell: [phone]

## 2023-12-02 NOTE — Progress Notes (Signed)
   12/02/2023  Patient ID: Holly Forbes, female   DOB: 22-Jan-1951, 73 y.o.   MRN: [id number]  This patient is appearing on a report for being at risk of failing the adherence measure for identified medications this calendar year.    Medication Adherence Summary (STAR/HEDIS Monitoring): Adherence Category: diabetes    Drug Name: Rybelsus  7 mg               Last Filled: 11/25/2023 (sold 11/30/23) Days' Supply: 30      Notes: Adherence data pulled from pharmacy claims portal Dr. Annemarie.   Patient outreached since Rybelsus  paperwork has been submitted by provider and was following up to verify if patient dropped off paperwork.     Holly Forbes, PharmD Clinical Pharmacist Cell: [phone]

## 2023-12-25 ENCOUNTER — Other Ambulatory Visit: Payer: Self-pay | Admitting: Nurse Practitioner

## 2023-12-28 ENCOUNTER — Telehealth: Payer: Self-pay

## 2023-12-28 NOTE — Progress Notes (Signed)
   12/28/2023  Patient ID: Holly Forbes, female   DOB: 12/10/1950, 73 y.o.   MRN: [id number]  Patient was followed up today for medication adherence with Rybelsus  but it was last filled 11/25/2023 for 30 days supply. Will also verify whether patient was approved for medication assistance yet or if she is still getting the medication through the pharmacy.   Will send a MyChart message to patient.   Thanks,  Dorcas Solian, PharmD Clinical Pharmacist Cell: [phone]

## 2024-01-04 ENCOUNTER — Telehealth: Payer: Self-pay

## 2024-01-04 NOTE — Progress Notes (Addendum)
   01/04/2024  Patient ID: Erminio Sharl Satterfield, female   DOB: [birth year], 73 y.o.   MRN: [id number]  This patient is appearing on a report for being at risk of failing the adherence measure for identified medications this calendar year.    Medication Adherence Summary (STAR/HEDIS Monitoring): Adherence Category: diabetes, cholesterol, hypertension    Drug Name: Rybelsus  7 mg               Last Sold: 12/30/2023  Days' Supply: 30    Drug Name: Lisinopril  40 mg                Last Sold: 11/16/2023  Days' Supply:90  Drug Name: Rosuvastatin  5 mg            Last Sold: 11/16/2023  Days' Supply:90    Notes: Patient has not been approved yet for medication assistance per discussion with clinical staff. Will follow up later this year, if patient has not been approved, to enroll in medication assistance for next year.   Will follow up prior to next fill in September for [facility]    Dorcas Solian, PharmD Clinical Pharmacist Cell: [phone]

## 2024-01-07 DIAGNOSIS — H401131 Primary open-angle glaucoma, bilateral, mild stage: Secondary | ICD-10-CM | POA: Diagnosis not present

## 2024-01-07 DIAGNOSIS — Z961 Presence of intraocular lens: Secondary | ICD-10-CM | POA: Diagnosis not present

## 2024-01-07 DIAGNOSIS — H26491 Other secondary cataract, right eye: Secondary | ICD-10-CM | POA: Diagnosis not present

## 2024-01-07 DIAGNOSIS — E119 Type 2 diabetes mellitus without complications: Secondary | ICD-10-CM | POA: Diagnosis not present

## 2024-01-25 ENCOUNTER — Telehealth: Payer: Self-pay

## 2024-01-25 NOTE — Progress Notes (Signed)
   01/25/2024  Patient ID: Erminio Sharl Satterfield, female   DOB: [birth year], 73 y.o.   MRN: [id number]  This patient is appearing on a report for being at risk of failing the adherence measure for identified medications this calendar year.    Medication Adherence Summary (STAR/HEDIS Monitoring): Adherence Category: diabetes, cholesterol, hypertension    Drug Name: Rybelsus  7 mg               Last Sold: 12/30/2023  Days' Supply: 30  Drug Name: Jardiance 10 mg                Last Sold: 01/25/2024  Days' Supply: 30  Drug Name: Lisinopril  40 mg                Last Sold: 11/16/2023  Days' Supply:90 - This is due soon towards the end of the month  Drug Name: Rosuvastatin  5 mg            Last Sold: 12/25/2023  Days' Supply:90    Notes: Patient has not been approved yet for medication assistance per discussion with clinical staff. Will follow up later this year, if patient has not been approved, to enroll in medication assistance for next year.      Dorcas Solian, PharmD Clinical Pharmacist Cell: [phone]

## 2024-01-26 ENCOUNTER — Other Ambulatory Visit: Payer: Self-pay | Admitting: Internal Medicine

## 2024-01-26 DIAGNOSIS — E119 Type 2 diabetes mellitus without complications: Secondary | ICD-10-CM

## 2024-02-08 NOTE — Progress Notes (Signed)
   02/08/2024  Patient ID: Holly Forbes, female   DOB: 1950-07-09, 73 y.o.   MRN: [id number]  Pharmacy Quality Measure Review  This patient is appearing on a report for being at risk of failing the adherence measure for diabetes medications this calendar year.   Medication: Rybelsus  Last fill date: 01/25/24 for 90 day supply  Insurance report was not up to date. No action needed at this time.  Confirmed via DrFirst, sold 01/28/24  Jon VEAR Lindau, PharmD Clinical Pharmacist [phone]

## 2024-02-18 ENCOUNTER — Encounter: Payer: Self-pay | Admitting: Nurse Practitioner

## 2024-02-18 ENCOUNTER — Ambulatory Visit: Payer: PPO | Admitting: Nurse Practitioner

## 2024-02-18 VITALS — BP 128/72 | HR 73 | Temp 97.6°F | Resp 16 | Ht 67.0 in | Wt 201.8 lb

## 2024-02-18 DIAGNOSIS — E1169 Type 2 diabetes mellitus with other specified complication: Secondary | ICD-10-CM | POA: Diagnosis not present

## 2024-02-18 DIAGNOSIS — E119 Type 2 diabetes mellitus without complications: Secondary | ICD-10-CM | POA: Diagnosis not present

## 2024-02-18 DIAGNOSIS — I152 Hypertension secondary to endocrine disorders: Secondary | ICD-10-CM

## 2024-02-18 DIAGNOSIS — E1159 Type 2 diabetes mellitus with other circulatory complications: Secondary | ICD-10-CM | POA: Diagnosis not present

## 2024-02-18 DIAGNOSIS — Z0001 Encounter for general adult medical examination with abnormal findings: Secondary | ICD-10-CM

## 2024-02-18 DIAGNOSIS — Z1231 Encounter for screening mammogram for malignant neoplasm of breast: Secondary | ICD-10-CM

## 2024-02-18 DIAGNOSIS — E785 Hyperlipidemia, unspecified: Secondary | ICD-10-CM

## 2024-02-18 DIAGNOSIS — I10 Essential (primary) hypertension: Secondary | ICD-10-CM

## 2024-02-18 DIAGNOSIS — K581 Irritable bowel syndrome with constipation: Secondary | ICD-10-CM

## 2024-02-18 DIAGNOSIS — Z0000 Encounter for general adult medical examination without abnormal findings: Secondary | ICD-10-CM

## 2024-02-18 LAB — POCT GLYCOSYLATED HEMOGLOBIN (HGB A1C): Hemoglobin A1C: 5.7 % — AB (ref 4.0–5.6)

## 2024-02-18 NOTE — Progress Notes (Signed)
 [facility] [address] [place], [place] [address]  Internal MEDICINE  Office Visit Note  Patient Name: Holly Forbes  [id number]  [id number]  Date of Service: 02/18/2024  Chief Complaint  Patient presents with   Diabetes   Gastroesophageal Reflux   Hypertension   Medicare Wellness    HPI Aryam presents for an annual well visit and physical exam.  Well-appearing 73 y.o. female with hypertension, diabetes, GERD, allergic rhinitis, and osteoarthritis.  Routine CRC screening: done in 2022, due in 2027 for colonoscopy, has a family history of colon cancer  Routine mammogram: due for mammogram now, goes to [facility]  DEXA scan: done in 2024 Eye exam: diabetic eye exam done in February.  foot exam: done today.  Labs: die for routine labs  New or worsening pain: some times her hips bother her off and on.   Other concerns: none      02/18/2024    9:05 AM 02/12/2023    9:44 AM 02/07/2022    9:11 AM  MMSE - Mini Mental State Exam  Orientation to time 5 5 5   Orientation to Place 5 5 5   Registration 3 3 3   Attention/ Calculation 5 5 5   Recall 3 3 3   Language- name 2 objects 2 2 2   Language- repeat 1 1 1   Language- follow 3 step command 3 3 3   Language- read & follow direction 1 1 1   Write a sentence 1 1 1   Copy design 1 1 1   Total score 30 30 30     Functional Status Survey: Is the patient deaf or have difficulty hearing?: No Does the patient have difficulty seeing, even when wearing glasses/contacts?: No Does the patient have difficulty concentrating, remembering, or making decisions?: No Does the patient have difficulty walking or climbing stairs?: No Does the patient have difficulty dressing or bathing?: No Does the patient have difficulty doing errands alone such as visiting a doctor's office or shopping?: No     10/17/2021    1:53 PM 02/07/2022    9:10 AM 06/19/2022   10:09 AM 02/12/2023    9:43 AM 02/18/2024    9:04 AM  Fall Risk  Falls in the past  year? 0 0 0 0 0  Was there an injury with Fall?  0 0 0 0  Fall Risk Category Calculator  0 0 0 0  Fall Risk Category (Retired)  Low      (RETIRED) Patient Fall Risk Level  Low fall risk      Patient at Risk for Falls Due to  No Fall Risks No Fall Risks No Fall Risks No Fall Risks  Fall risk Follow up  Falls evaluation completed  Falls evaluation completed Falls evaluation completed Falls evaluation completed     Data saved with a previous flowsheet row definition       02/18/2024    9:04 AM  Depression screen PHQ 2/9  Decreased Interest 0  Down, Depressed, Hopeless 0  PHQ - 2 Score 0       Current Medication: Outpatient Encounter Medications as of 02/18/2024  Medication Sig   amLODipine  (NORVASC ) 2.5 MG tablet TAKE 1 TABLET BY MOUTH EVERY NIGHT AT BEDTIME FOR BLOOD PRESSURE   aspirin 81 MG tablet Take 81 mg by mouth daily.    Blood Glucose Monitoring Suppl (ONETOUCH VERIO FLEX SYSTEM) w/Device KIT Use as directed DX E11.65   Cholecalciferol (DIALYVITE VITAMIN D  5000) 125 MCG (5000 UT) capsule Take 5,000 Units by mouth daily.  cyanocobalamin  1000 MCG tablet Take 1,000 mcg by mouth daily.   dorzolamide-timolol  (COSOPT) 22.3-6.8 MG/ML ophthalmic solution Place 1 drop into both eyes every evening.   furosemide  (LASIX ) 20 MG tablet TAKE 1 TABLET BY MOUTH DAILY   ibuprofen  (ADVIL ) 400 MG tablet Take one tab po bid prn for pain   Lancets (ONETOUCH DELICA PLUS LANCET30G) MISC USE AS DIRECTED ONCE DAILY   latanoprost (XALATAN) 0.005 % ophthalmic solution Place 1 drop into both eyes at bedtime.    lisinopril  (ZESTRIL ) 40 MG tablet TAKE 1 TABLET(40 MG) BY MOUTH DAILY   ONETOUCH VERIO test strip USE FOR TESTING ONCE DAILY AS DIRECTED   pantoprazole  (PROTONIX ) 20 MG tablet TAKE 1 TABLET BY MOUTH EVERY DAY   rosuvastatin  (CRESTOR ) 5 MG tablet Take 1 tablet (5 mg total) by mouth daily.   RYBELSUS  7 MG TABS TAKE 1 TABLET BY MOUTH EVERY DAY WITH BREAKFAST ON AN EMPTY STOMACH, WITH NO MORE THAN  4 OUNCES OF WATER , AND WAIT 30 MINUTES BEFORE EATING,   [DISCONTINUED] Plecanatide  (TRULANCE ) 3 MG TABS Take 1 tablet (3 mg total) by mouth daily.   No facility-administered encounter medications on file as of 02/18/2024.    Surgical History: Past Surgical History:  Procedure Laterality Date   ABDOMINAL HYSTERECTOMY     BREAST EXCISIONAL BIOPSY Left 2015   cyst removed   CATARACT EXTRACTION W/PHACO Right 03/27/2020   Procedure: CATARACT EXTRACTION PHACO AND INTRAOCULAR LENS PLACEMENT (IOC) RIGHT 5.03 00:50.6 9.9%;  Surgeon: Mittie Gaskin, MD;  Location: [facility];  Service: Ophthalmology;  Laterality: Right;   CATARACT EXTRACTION W/PHACO Left 04/18/2020   Procedure: CATARACT EXTRACTION PHACO AND INTRAOCULAR LENS PLACEMENT (IOC) LEFT DIABETIC 4.56 00:51.7 8.8%;  Surgeon: Mittie Gaskin, MD;  Location: [facility];  Service: Ophthalmology;  Laterality: Left;   CHOLECYSTECTOMY     COLONOSCOPY N/A 09/25/2014   Procedure: COLONOSCOPY;  Surgeon: Rogelia Copping, MD;  Location: [facility];  Service: Gastroenterology;  Laterality: N/A;  cecum time- [id number]   COLONOSCOPY WITH PROPOFOL  N/A 07/16/2020   Procedure: COLONOSCOPY WITH PROPOFOL ;  Surgeon: Unk Corinn Skiff, MD;  Location: [facility];  Service: Endoscopy;  Laterality: N/A;   CYST REMOVAL NECK     ESOPHAGOGASTRODUODENOSCOPY N/A 09/25/2014   Procedure: ESOPHAGOGASTRODUODENOSCOPY (EGD);  Surgeon: Rogelia Copping, MD;  Location: [facility];  Service: Gastroenterology;  Laterality: N/A;   ESOPHAGOGASTRODUODENOSCOPY (EGD) WITH PROPOFOL  N/A 04/07/2018   Procedure: ESOPHAGOGASTRODUODENOSCOPY (EGD) WITH PROPOFOL ;  Surgeon: Toledo, Ladell POUR, MD;  Location: [facility] ENDOSCOPY;  Service: Gastroenterology;  Laterality: N/A;   POLYPECTOMY  09/25/2014   Procedure: POLYPECTOMY INTESTINAL;  Surgeon: Rogelia Copping, MD;  Location: [facility];  Service: Gastroenterology;;    Medical History: Past Medical History:  Diagnosis  Date   Abdominal pain    Arthritis    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   Back pain    Left sciatica, low back pain   Cataract    Diabetes mellitus without complication (HCC)    GERD (gastroesophageal reflux disease)    Glaucoma    Hypertension    Vitamin D  deficiency     Family History: Family History  Problem Relation Age of Onset   Heart disease Sister    Diabetes Sister    Hypertension Sister    Heart disease Brother    Diabetes Brother    Hypertension Brother    Breast cancer Daughter 29   Colon cancer Mother    Stroke Mother    Lung cancer Father  Social History   Socioeconomic History   Marital status: Married    Spouse name: Not on file   Number of children: Not on file   Years of education: Not on file   Highest education level: Not on file  Occupational History   Not on file  Tobacco Use   Smoking status: Former    Current packs/day: 0.00    Average packs/day: 0.3 packs/day for 15.0 years (3.8 ttl pk-yrs)    Types: Cigarettes    Start date: 64    Quit date: 58    Years since quitting: 30.7   Smokeless tobacco: Never  Vaping Use   Vaping status: Never Used  Substance and Sexual Activity   Alcohol use: Yes    Comment: 3-5 glasses wine/month   Drug use: Not Currently    Types: Marijuana    Comment: previous many years ago, during young adulthood.   Sexual activity: Not on file  Other Topics Concern   Not on file  Social History Narrative   Not on file   Social Drivers of Health   Financial Resource Strain: Low Risk  (02/12/2023)   Overall Financial Resource Strain (CARDIA)    Difficulty of Paying Living Expenses: Not hard at all  Food Insecurity: No Food Insecurity (02/12/2023)   Hunger Vital Sign    Worried About Running Out of Food in the Last Year: Never true    Ran Out of Food in the Last Year: Never true  Transportation Needs: No Transportation Needs (02/12/2023)   PRAPARE - Administrator, Civil Service (Medical): No     Lack of Transportation (Non-Medical): No  Physical Activity: Insufficiently Active (02/12/2023)   Exercise Vital Sign    Days of Exercise per Week: 2 days    Minutes of Exercise per Session: 30 min  Stress: Stress Concern Present (02/12/2023)   [employer] of Occupational Health - Occupational Stress Questionnaire    Feeling of Stress : To some extent  Social Connections: Moderately Integrated (02/12/2023)   Social Connection and Isolation Panel    Frequency of Communication with Friends and Family: More than three times a week    Frequency of Social Gatherings with Friends and Family: Once a week    Attends Religious Services: More than 4 times per year    Active Member of [employer] or Organizations: No    Attends Banker Meetings: Never    Marital Status: Married  Recent Concern: Social Connections - Moderately Isolated (02/12/2023)   Social Connection and Isolation Panel    Frequency of Communication with Friends and Family: More than three times a week    Frequency of Social Gatherings with Friends and Family: Once a week    Attends Religious Services: Never    Database Administrator or Organizations: No    Attends Banker Meetings: Never    Marital Status: Married  Catering Manager Violence: Not At Risk (02/12/2023)   Humiliation, Afraid, Rape, and Kick questionnaire    Fear of Current or Ex-Partner: No    Emotionally Abused: No    Physically Abused: No    Sexually Abused: No      Review of Systems  Constitutional:  Negative for activity change, appetite change, chills, fatigue, fever and unexpected weight change.  HENT: Negative.  Negative for congestion, ear pain, rhinorrhea, sore throat and trouble swallowing.   Eyes: Negative.   Respiratory: Negative.  Negative for cough, chest tightness, shortness of breath and wheezing.  Cardiovascular: Negative.  Negative for chest pain.  Gastrointestinal: Negative.  Negative for abdominal pain, blood in  stool, constipation, diarrhea, nausea and vomiting.  Endocrine: Negative.   Genitourinary: Negative.  Negative for difficulty urinating, dysuria, frequency, hematuria and urgency.  Musculoskeletal: Negative.  Negative for arthralgias, back pain, joint swelling, myalgias and neck pain.  Skin: Negative.  Negative for rash and wound.  Allergic/Immunologic: Negative.  Negative for immunocompromised state.  Neurological: Negative.  Negative for dizziness, seizures, numbness and headaches.  Hematological: Negative.   Psychiatric/Behavioral: Negative.  Negative for behavioral problems, self-injury and suicidal ideas. The patient is not nervous/anxious.     Vital Signs: BP 128/72   Pulse 73   Temp 97.6 F (36.4 C)   Resp 16   Ht 5' 7 (1.702 m)   Wt 201 lb 12.8 oz (91.5 kg)   LMP  (LMP Unknown)   SpO2 99%   BMI 31.61 kg/m    Physical Exam Vitals reviewed.  Constitutional:      General: She is not in acute distress.    Appearance: Normal appearance. She is well-developed. She is obese. She is not ill-appearing or diaphoretic.  HENT:     Head: Normocephalic and atraumatic.     Right Ear: Tympanic membrane, ear canal and external ear normal.     Left Ear: Tympanic membrane, ear canal and external ear normal.     Nose: Nose normal. No congestion or rhinorrhea.     Mouth/Throat:     Mouth: Mucous membranes are moist.     Pharynx: Oropharynx is clear. No oropharyngeal exudate or posterior oropharyngeal erythema.  Eyes:     General: No scleral icterus.       Right eye: No discharge.        Left eye: No discharge.     Extraocular Movements: Extraocular movements intact.     Conjunctiva/sclera: Conjunctivae normal.     Pupils: Pupils are equal, round, and reactive to light.  Neck:     Thyroid : No thyromegaly.     Vascular: No JVD.     Trachea: No tracheal deviation.  Cardiovascular:     Rate and Rhythm: Normal rate and regular rhythm.     Pulses: Normal pulses.     Heart sounds:  Normal heart sounds. No murmur heard.    No friction rub. No gallop.  Pulmonary:     Effort: Pulmonary effort is normal. No respiratory distress.     Breath sounds: Normal breath sounds. No stridor. No wheezing or rales.  Chest:     Chest wall: No tenderness.  Breasts:    Breasts are symmetrical.     Right: Normal. No swelling, bleeding, inverted nipple, mass, nipple discharge, skin change or tenderness.     Left: Normal. No swelling, bleeding, inverted nipple, mass, nipple discharge, skin change or tenderness.  Abdominal:     General: Bowel sounds are normal. There is no distension.     Palpations: Abdomen is soft. There is no mass.     Tenderness: There is no abdominal tenderness. There is no guarding or rebound.  Musculoskeletal:        General: No tenderness or deformity. Normal range of motion.     Cervical back: Normal range of motion and neck supple.  Lymphadenopathy:     Cervical: No cervical adenopathy.     Upper Body:     Right upper body: No supraclavicular, axillary or pectoral adenopathy.     Left upper body: No supraclavicular, axillary or pectoral  adenopathy.  Skin:    General: Skin is warm and dry.     Capillary Refill: Capillary refill takes less than 2 seconds.     Coloration: Skin is not pale.     Findings: No erythema or rash.  Neurological:     Mental Status: She is alert and oriented to person, place, and time.     Cranial Nerves: No cranial nerve deficit.     Motor: No abnormal muscle tone.     Coordination: Coordination normal.     Gait: Gait normal.     Deep Tendon Reflexes: Reflexes are normal and symmetric.  Psychiatric:        Mood and Affect: Mood normal.        Behavior: Behavior normal.        Thought Content: Thought content normal.        Judgment: Judgment normal.        Assessment/Plan: 1. Encounter for Medicare annual examination with abnormal findings Age-appropriate preventive screenings and vaccinations discussed. Routine labs for  health maintenance ordered will be ordered. PHM updated.    2. Type 2 diabetes mellitus with other specified complication, without long-term current use of insulin (HCC) (Primary) A1c is stable at 5.7 today. Urine sent for microalbumin/creatinine ratio. Continue rybelsus  as prescribed.  - POCT glycosylated hemoglobin (Hb A1C) - Urine Microalbumin w/creat. ratio  3. Hypertension associated with diabetes (HCC) Continue amlodipine , furosemide  and lisinopril  as prescribed.   4. Hyperlipidemia associated with type 2 diabetes mellitus (HCC) Continue rosuvastatin  as prescribed   5. Irritable bowel syndrome with constipation Continue prn OTC medication for constipation   6. Encounter for screening mammogram for malignant neoplasm of breast Routine mammogram ordered  - MM 3D SCREENING MAMMOGRAM BILATERAL BREAST; Future      General Counseling: Viann verbalizes understanding of the findings of todays visit and agrees with plan of treatment. I have discussed any further diagnostic evaluation that may be needed or ordered today. We also reviewed her medications today. she has been encouraged to call the office with any questions or concerns that should arise related to todays visit.    Orders Placed This Encounter  Procedures   MM 3D SCREENING MAMMOGRAM BILATERAL BREAST   POCT glycosylated hemoglobin (Hb A1C)    No orders of the defined types were placed in this encounter.   Return in about 6 months (around 08/18/2024) for F/U, Recheck A1C, Fredda Clarida PCP.   Total time spent:30 Minutes Time spent includes review of chart, medications, test results, and follow up plan with the patient.   [place] Controlled Substance Database was reviewed by me.  This patient was seen by Mardy Maxin, FNP-C in collaboration with Dr. Sigrid Bathe as a part of collaborative care agreement.  [facility] R. Maxin, MSN, FNP-C Internal medicine

## 2024-02-19 ENCOUNTER — Other Ambulatory Visit: Payer: Self-pay | Admitting: Nurse Practitioner

## 2024-02-19 DIAGNOSIS — E782 Mixed hyperlipidemia: Secondary | ICD-10-CM | POA: Diagnosis not present

## 2024-02-19 DIAGNOSIS — Z1231 Encounter for screening mammogram for malignant neoplasm of breast: Secondary | ICD-10-CM | POA: Diagnosis not present

## 2024-02-19 DIAGNOSIS — E1165 Type 2 diabetes mellitus with hyperglycemia: Secondary | ICD-10-CM | POA: Diagnosis not present

## 2024-02-19 DIAGNOSIS — E559 Vitamin D deficiency, unspecified: Secondary | ICD-10-CM | POA: Diagnosis not present

## 2024-02-19 DIAGNOSIS — E538 Deficiency of other specified B group vitamins: Secondary | ICD-10-CM | POA: Diagnosis not present

## 2024-02-20 LAB — COMPREHENSIVE METABOLIC PANEL WITH GFR
ALT: 24 IU/L (ref 0–32)
AST: 20 IU/L (ref 0–40)
Albumin: 4.2 g/dL (ref 3.8–4.8)
Alkaline Phosphatase: 101 IU/L (ref 49–135)
BUN/Creatinine Ratio: 7 — ABNORMAL LOW (ref 12–28)
BUN: 5 mg/dL — ABNORMAL LOW (ref 8–27)
Bilirubin Total: 0.4 mg/dL (ref 0.0–1.2)
CO2: 23 mmol/L (ref 20–29)
Calcium: 9.6 mg/dL (ref 8.7–10.3)
Chloride: 101 mmol/L (ref 96–106)
Creatinine, Ser: 0.67 mg/dL (ref 0.57–1.00)
Globulin, Total: 3.1 g/dL (ref 1.5–4.5)
Glucose: 95 mg/dL (ref 70–99)
Potassium: 4.2 mmol/L (ref 3.5–5.2)
Sodium: 139 mmol/L (ref 134–144)
Total Protein: 7.3 g/dL (ref 6.0–8.5)
eGFR: 93 mL/min/1.73 (ref >59)

## 2024-02-20 LAB — LIPID PANEL
Chol/HDL Ratio: 2.5 ratio (ref 0.0–4.4)
Cholesterol, Total: 143 mg/dL (ref 100–199)
HDL: 57 mg/dL (ref >39)
LDL Chol Calc (NIH): 73 mg/dL (ref 0–99)
Triglycerides: 65 mg/dL (ref 0–149)
VLDL Cholesterol Cal: 13 mg/dL (ref 5–40)

## 2024-02-20 LAB — CBC WITH DIFFERENTIAL/PLATELET
Basophils Absolute: 0 x10E3/uL (ref 0.0–0.2)
Basos: 1 %
EOS (ABSOLUTE): 0.1 x10E3/uL (ref 0.0–0.4)
Eos: 2 %
Hematocrit: 40.9 % (ref 34.0–46.6)
Hemoglobin: 12.6 g/dL (ref 11.1–15.9)
Immature Grans (Abs): 0 x10E3/uL (ref 0.0–0.1)
Immature Granulocytes: 0 %
Lymphocytes Absolute: 1.8 x10E3/uL (ref 0.7–3.1)
Lymphs: 29 %
MCH: 26.1 pg — ABNORMAL LOW (ref 26.6–33.0)
MCHC: 30.8 g/dL — ABNORMAL LOW (ref 31.5–35.7)
MCV: 85 fL (ref 79–97)
Monocytes Absolute: 0.5 x10E3/uL (ref 0.1–0.9)
Monocytes: 8 %
Neutrophils Absolute: 3.7 x10E3/uL (ref 1.4–7.0)
Neutrophils: 60 %
Platelets: 334 x10E3/uL (ref 150–450)
RBC: 4.82 x10E6/uL (ref 3.77–5.28)
RDW: 14.6 % (ref 11.7–15.4)
WBC: 6 x10E3/uL (ref 3.4–10.8)

## 2024-02-20 LAB — MICROALBUMIN / CREATININE URINE RATIO
Creatinine, Urine: 104.6 mg/dL
Microalb/Creat Ratio: 12 mg/g{creat} (ref 0–29)
Microalbumin, Urine: 12.2 ug/mL

## 2024-02-20 LAB — VITAMIN D 25 HYDROXY (VIT D DEFICIENCY, FRACTURES): Vit D, 25-Hydroxy: 18.5 ng/mL — ABNORMAL LOW (ref 30.0–100.0)

## 2024-02-20 LAB — B12 AND FOLATE PANEL
Folate: 12.1 ng/mL (ref >3.0)
Vitamin B-12: 484 pg/mL (ref 232–1245)

## 2024-02-23 NOTE — Progress Notes (Signed)
   02/23/2024  Patient ID: Holly Forbes, female   DOB: [birth year], 74 y.o.   MRN: [id number]  Pharmacy Quality Measure Review  This patient is appearing on a report for being at risk of failing the adherence measure for cholesterol (statin) and hypertension (ACEi/ARB) medications this calendar year.   Medication: Lisinopril  40mg  and Rosuvastatin  5mg  Last fill date: 02/18/24 for 90 day supply  Insurance report was not up to date. No action needed at this time.   Jon VEAR Lindau, PharmD Clinical Pharmacist [phone]

## 2024-02-29 ENCOUNTER — Encounter: Payer: Self-pay | Admitting: Nurse Practitioner

## 2024-02-29 ENCOUNTER — Ambulatory Visit: Admitting: Nurse Practitioner

## 2024-02-29 VITALS — BP 128/80 | HR 69 | Temp 95.7°F | Resp 16 | Ht 67.0 in | Wt 200.0 lb

## 2024-02-29 DIAGNOSIS — E559 Vitamin D deficiency, unspecified: Secondary | ICD-10-CM | POA: Insufficient documentation

## 2024-02-29 DIAGNOSIS — R7989 Other specified abnormal findings of blood chemistry: Secondary | ICD-10-CM

## 2024-02-29 DIAGNOSIS — R42 Dizziness and giddiness: Secondary | ICD-10-CM

## 2024-02-29 MED ORDER — MECLIZINE HCL 25 MG PO TABS: 12.5000 mg | ORAL_TABLET | Freq: Three times a day (TID) | ORAL | 3 refills | Status: AC | PRN

## 2024-02-29 MED ORDER — VITAMIN D (ERGOCALCIFEROL) 1.25 MG (50000 UNIT) PO CAPS: 50000.0000 [IU] | ORAL_CAPSULE | ORAL | 1 refills | Status: AC

## 2024-02-29 NOTE — Progress Notes (Unsigned)
 [facility] [address] [place], [place] [address]  Internal MEDICINE  Office Visit Note  Patient Name: Holly Forbes  [id number]  [id number]  Date of Service: 02/29/2024  Chief Complaint  Patient presents with  . Acute Visit    Headache and dizzy started on Saturday     HPI Reka presents for an acute sick visit for dizziness.  --onset of dizziness was 2 days ago on Saturday Patient endorses that the room is spinning, persistent constant dizziness  Also reports headache that has been varied in location, and describes the pain as pressure in the back of the head and throbbing in the forehead. She also reports some neck pain on the left side. Reports some pain in the left ear. Impaired balance due to the dizziness and vertigo.  Denies nausea, vomiting, hearing loss.  Reviewed labs -- low vitamin D  level 18.5. normal cholesterol panel. Low MCH and MCHC. The rest of her labs are grossly normal.      Current Medication:  Outpatient Encounter Medications as of 02/29/2024  Medication Sig  . meclizine (ANTIVERT) 25 MG tablet Take 0.5-1 tablets (12.5-25 mg total) by mouth 3 (three) times daily as needed for dizziness.  . amLODipine  (NORVASC ) 2.5 MG tablet TAKE 1 TABLET BY MOUTH EVERY NIGHT AT BEDTIME FOR BLOOD PRESSURE  . aspirin 81 MG tablet Take 81 mg by mouth daily.   . Blood Glucose Monitoring Suppl (ONETOUCH VERIO FLEX SYSTEM) w/Device KIT Use as directed DX E11.65  . Cholecalciferol (DIALYVITE VITAMIN D  5000) 125 MCG (5000 UT) capsule Take 5,000 Units by mouth daily.  . cyanocobalamin  1000 MCG tablet Take 1,000 mcg by mouth daily.  . dorzolamide-timolol  (COSOPT) 22.3-6.8 MG/ML ophthalmic solution Place 1 drop into both eyes every evening.  . furosemide  (LASIX ) 20 MG tablet TAKE 1 TABLET BY MOUTH DAILY  . ibuprofen  (ADVIL ) 400 MG tablet Take one tab po bid prn for pain  . Lancets (ONETOUCH DELICA PLUS LANCET30G) MISC USE AS DIRECTED ONCE DAILY  .  latanoprost (XALATAN) 0.005 % ophthalmic solution Place 1 drop into both eyes at bedtime.   . lisinopril  (ZESTRIL ) 40 MG tablet TAKE 1 TABLET(40 MG) BY MOUTH DAILY  . ONETOUCH VERIO test strip USE FOR TESTING ONCE DAILY AS DIRECTED  . pantoprazole  (PROTONIX ) 20 MG tablet TAKE 1 TABLET BY MOUTH EVERY DAY  . rosuvastatin  (CRESTOR ) 5 MG tablet Take 1 tablet (5 mg total) by mouth daily.  . RYBELSUS  7 MG TABS TAKE 1 TABLET BY MOUTH EVERY DAY WITH BREAKFAST ON AN EMPTY STOMACH, WITH NO MORE THAN 4 OUNCES OF WATER , AND WAIT 30 MINUTES BEFORE EATING,   No facility-administered encounter medications on file as of 02/29/2024.      Medical History: Past Medical History:  Diagnosis Date  . Abdominal pain   . 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 Back pain    Left sciatica, low back pain  . Cataract   . Diabetes mellitus without complication (HCC)   . GERD (gastroesophageal reflux disease)   . Glaucoma   . Hypertension   . Vitamin D  deficiency      Vital Signs: BP 128/80   Pulse 69   Temp (!) 95.7 F (35.4 C)   Resp 16   Ht 5' 7 (1.702 m)   Wt 200 lb (90.7 kg)   LMP  (LMP Unknown)   SpO2 95%   BMI 31.32 kg/m    Review of Systems  Constitutional:  Positive  for fatigue.  HENT:  Positive for ear pain (left ear). Negative for congestion, hearing loss, postnasal drip, rhinorrhea, sinus pressure, sinus pain, sneezing, sore throat and tinnitus.   Eyes:  Negative for visual disturbance.  Respiratory:  Negative for cough, chest tightness, shortness of breath and wheezing.   Cardiovascular: Negative.  Negative for chest pain and palpitations.  Gastrointestinal: Negative.   Genitourinary: Negative.   Musculoskeletal:  Positive for neck pain (left side).  Skin: Negative.   Neurological:  Positive for dizziness, light-headedness and headaches.    Physical Exam Vitals reviewed.  Constitutional:      General: She is not in acute distress.    Appearance: Normal appearance. She is  obese. She is not ill-appearing.  HENT:     Head: Normocephalic and atraumatic.     Right Ear: Hearing, tympanic membrane, ear canal and external ear normal. No decreased hearing noted.     Left Ear: Hearing, tympanic membrane and external ear normal. No decreased hearing noted. Tenderness present.     Ears:     Comments: Slightly redness of the ear canal of the left ear  Eyes:     Pupils: Pupils are equal, round, and reactive to light.  Cardiovascular:     Rate and Rhythm: Normal rate and regular rhythm.  Pulmonary:     Effort: Pulmonary effort is normal. No respiratory distress.  Neurological:     Mental Status: She is alert and oriented to person, place, and time.  Psychiatric:        Mood and Affect: Mood normal.        Behavior: Behavior normal.       Assessment/Plan: 1. Vertigo (Primary) *** - Ambulatory referral to ENT - meclizine (ANTIVERT) 25 MG tablet; Take 0.5-1 tablets (12.5-25 mg total) by mouth 3 (three) times daily as needed for dizziness.  Dispense: 60 tablet; Refill: 3  2. Vitamin D  deficiency *** - Vitamin D , Ergocalciferol , (DRISDOL) 1.25 MG (50000 UNIT) CAPS capsule; Take 1 capsule (50,000 Units total) by mouth every 7 (seven) days.  Dispense: 12 capsule; Refill: 1  3. Abnormal CBC ***   General Counseling: Geroldine verbalizes understanding of the findings of todays visit and agrees with plan of treatment. I have discussed any further diagnostic evaluation that may be needed or ordered today. We also reviewed her medications today. she has been encouraged to call the office with any questions or concerns that should arise related to todays visit.    Counseling:    Orders Placed This Encounter  Procedures  . Ambulatory referral to ENT    Meds ordered this encounter  Medications  . meclizine (ANTIVERT) 25 MG tablet    Sig: Take 0.5-1 tablets (12.5-25 mg total) by mouth 3 (three) times daily as needed for dizziness.    Dispense:  60 tablet     Refill:  3    Fill new script today    Return if symptoms worsen or fail to improve, for ENT referral .  [place] Controlled Substance Database was reviewed by me for overdose risk score (ORS)  Time spent:30 Minutes Time spent with patient included reviewing progress notes, labs, imaging studies, and discussing plan for follow up.   This patient was seen by Mardy Maxin, FNP-C in collaboration with Dr. Sigrid Bathe as a part of collaborative care agreement.  Yeira Gulden R. Maxin, MSN, FNP-C Internal Medicine

## 2024-03-01 ENCOUNTER — Telehealth: Payer: Self-pay | Admitting: Nurse Practitioner

## 2024-03-01 NOTE — Telephone Encounter (Signed)
 Awaiting 02/29/24 office notes for Otolaryngology referral-Toni

## 2024-03-02 ENCOUNTER — Telehealth: Payer: Self-pay | Admitting: Nurse Practitioner

## 2024-03-02 ENCOUNTER — Encounter: Payer: Self-pay | Admitting: Nurse Practitioner

## 2024-03-02 NOTE — Telephone Encounter (Signed)
 Otolaryngology appointment 03/08/24 with [facility]-Toni

## 2024-03-02 NOTE — Telephone Encounter (Signed)
 Urgent Otolaryngology referral sent via [facility] to [facility]. Gave patient telephone # [phone]

## 2024-03-08 DIAGNOSIS — H8123 Vestibular neuronitis, bilateral: Secondary | ICD-10-CM | POA: Diagnosis not present

## 2024-03-08 DIAGNOSIS — H903 Sensorineural hearing loss, bilateral: Secondary | ICD-10-CM | POA: Diagnosis not present

## 2024-03-19 ENCOUNTER — Encounter: Payer: Self-pay | Admitting: Nurse Practitioner

## 2024-04-26 ENCOUNTER — Other Ambulatory Visit: Payer: Self-pay | Admitting: Nurse Practitioner

## 2024-05-31 ENCOUNTER — Ambulatory Visit: Admitting: Nurse Practitioner

## 2024-05-31 ENCOUNTER — Encounter: Payer: Self-pay | Admitting: Nurse Practitioner

## 2024-05-31 VITALS — BP 130/78 | HR 73 | Temp 95.9°F | Resp 16 | Ht 67.0 in | Wt 198.8 lb

## 2024-05-31 DIAGNOSIS — M79645 Pain in left finger(s): Secondary | ICD-10-CM | POA: Diagnosis not present

## 2024-05-31 DIAGNOSIS — K5900 Constipation, unspecified: Secondary | ICD-10-CM

## 2024-05-31 DIAGNOSIS — G8929 Other chronic pain: Secondary | ICD-10-CM | POA: Diagnosis not present

## 2024-05-31 DIAGNOSIS — M67442 Ganglion, left hand: Secondary | ICD-10-CM | POA: Diagnosis not present

## 2024-05-31 DIAGNOSIS — M67441 Ganglion, right hand: Secondary | ICD-10-CM | POA: Diagnosis not present

## 2024-05-31 DIAGNOSIS — M79644 Pain in right finger(s): Secondary | ICD-10-CM

## 2024-05-31 NOTE — Progress Notes (Signed)
 [facility] [address] [place], [place] [address]  Internal MEDICINE  Office Visit Note  Patient Name: Holly Forbes  [id number]  [id number]  Date of Service: 05/31/2024  Chief Complaint  Patient presents with   Acute Visit    Bilateral thumb bone spurs     HPI Holly Forbes presents for an acute sick visit for pain of both thumbs.  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  Has a possibly bone spur or ganglion cyst on the base of the thumb joint on both hands.  Used to only cause pain off and on but now pain is constant. She rates the pain at about a 5 out of 10 most of the time but can have flare ups of pain that is 10 out of 10.  Trouble sleeping due to pain in thumbs as well.   Constipation -- x2 weeks -- has tried a few things over the counter, discussed additional options to try.   Current Medication:  Outpatient Encounter Medications as of 05/31/2024  Medication Sig   amLODipine  (NORVASC ) 2.5 MG tablet TAKE 1 TABLET BY MOUTH EVERY NIGHT AT BEDTIME FOR BLOOD PRESSURE   aspirin 81 MG tablet Take 81 mg by mouth daily.    Blood Glucose Monitoring Suppl (ONETOUCH VERIO FLEX SYSTEM) w/Device KIT Use as directed DX E11.65   Cholecalciferol (DIALYVITE VITAMIN D  5000) 125 MCG (5000 UT) capsule Take 5,000 Units by mouth daily.   cyanocobalamin  1000 MCG tablet Take 1,000 mcg by mouth daily.   dorzolamide-timolol  (COSOPT) 22.3-6.8 MG/ML ophthalmic solution Place 1 drop into both eyes every evening.   furosemide  (LASIX ) 20 MG tablet TAKE 1 TABLET BY MOUTH DAILY   ibuprofen  (ADVIL ) 400 MG tablet Take one tab po bid prn for pain   Lancets (ONETOUCH DELICA PLUS LANCET30G) MISC USE AS DIRECTED ONCE DAILY   latanoprost (XALATAN) 0.005 % ophthalmic solution Place 1 drop into both eyes at bedtime.    lisinopril  (ZESTRIL ) 40 MG tablet TAKE 1 TABLET(40 MG) BY MOUTH DAILY   meclizine  (ANTIVERT ) 25 MG tablet Take 0.5-1 tablets (12.5-25 mg total) by mouth 3 (three) times  daily as needed for dizziness.   ONETOUCH VERIO test strip USE FOR TESTING ONCE DAILY AS DIRECTED   pantoprazole  (PROTONIX ) 20 MG tablet TAKE 1 TABLET BY MOUTH EVERY DAY   rosuvastatin  (CRESTOR ) 5 MG tablet Take 1 tablet (5 mg total) by mouth daily.   RYBELSUS  7 MG TABS TAKE 1 TABLET BY MOUTH EVERY DAY WITH BREAKFAST ON AN EMPTY STOMACH, WITH NO MORE THAN 4 OUNCES OF WATER , AND WAIT 30 MINUTES BEFORE EATING,   Vitamin D , Ergocalciferol , (DRISDOL ) 1.25 MG (50000 UNIT) CAPS capsule Take 1 capsule (50,000 Units total) by mouth every 7 (seven) days.   No facility-administered encounter medications on file as of 05/31/2024.      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  Negative for fatigue.  HENT: Negative.    Respiratory: Negative.  Negative for cough, chest tightness, shortness of breath and wheezing.   Cardiovascular:  Negative for chest pain and palpitations.  Gastrointestinal:  Positive for constipation. Negative for abdominal distention, abdominal pain, diarrhea, nausea and vomiting.  Genitourinary: Negative.   Musculoskeletal:  Positive for arthralgias.    Physical Exam Vitals reviewed.  Constitutional:      General: She is not in acute distress.    Appearance: Normal appearance. She is obese. She is not ill-appearing.  HENT:     Head: Normocephalic and atraumatic.      Nose: Nose normal.     Mouth/Throat:     Mouth: Mucous membranes are moist.     Pharynx: No posterior oropharyngeal erythema.  Eyes:     Extraocular Movements: Extraocular movements intact.     Conjunctiva/sclera: Conjunctivae normal.     Pupils: Pupils are equal, round, and reactive to light.  Cardiovascular:     Rate and Rhythm: Normal rate and regular rhythm.     Pulses: Normal pulses.     Heart sounds: Normal heart sounds.  Pulmonary:     Effort: Pulmonary effort is normal. No respiratory distress.     Breath sounds: Normal breath sounds.  Musculoskeletal:     Right hand: Swelling (slight in thumb), tenderness (thumb) and bony tenderness (thumb joint) present. Decreased range of motion (thumb).     Left hand: Swelling (slight in thumb), tenderness (thumb) and bony tenderness (thumb joint) present. Decreased range of motion (thumb).  Skin:    General: Skin is warm and dry.     Capillary Refill: Capillary refill takes less than 2 seconds.  Neurological:     General: No focal deficit present.     Mental Status: She is alert and oriented to person, place, and time.  Psychiatric:        Mood and Affect: Mood normal.        Behavior: Behavior normal.       Assessment/Plan: 1. Chronic thumb pain, bilateral (Primary) Referred to orthopedic surgery  - Ambulatory referral to Orthopedic Surgery  2. Ganglion cyst of both hands Referred to orthopedic surgery.  - Ambulatory referral to Orthopedic Surgery  3. Constipation, unspecified constipation type Try miralax OTC and/or magnesium citrate liquid. If no improvement, call clinic and will ordered xray to rule out impaction.    General Counseling: Holly Forbes understanding of the findings of todays visit and agrees with plan of treatment. I have discussed any further diagnostic evaluation that may be needed or ordered today. We also reviewed her medications today. she has been encouraged to call the office with any  questions or concerns that should arise related to todays visit.    Counseling:    Orders Placed This Encounter  Procedures   Ambulatory referral to Orthopedic Surgery    No orders of the defined types were placed in this encounter.   Return if symptoms worsen or fail to improve.  [place] Controlled Substance Database was reviewed by me for overdose risk score (ORS)  Time spent:30 Minutes Time spent with patient included reviewing progress notes, labs, imaging studies, and discussing plan for follow up.   This patient was seen by Mardy Maxin, FNP-C in collaboration with Dr.  Sigrid Bathe as a part of collaborative care agreement.  Sameer Teeple R. Liana, MSN, FNP-C Internal Medicine

## 2024-06-01 ENCOUNTER — Ambulatory Visit

## 2024-06-01 DIAGNOSIS — M79642 Pain in left hand: Secondary | ICD-10-CM

## 2024-06-01 DIAGNOSIS — M180 Bilateral primary osteoarthritis of first carpometacarpal joints: Secondary | ICD-10-CM

## 2024-06-01 DIAGNOSIS — M79641 Pain in right hand: Secondary | ICD-10-CM

## 2024-06-01 NOTE — Progress Notes (Signed)
 "  Office Visit Note   Patient: Holly Forbes           Date of Birth: 01-Oct-1950           MRN: [id number] Visit Date: 06/01/2024              Requested by: Liana Fish, NP [address] [place],  [place] [address] PCP: Liana Fish, NP   Assessment & Plan: Visit Diagnoses:  1. Arthritis of carpometacarpal (CMC) joint of both thumbs   2. Pain in left hand   3. Pain in right hand     Plan: Natural history and expected course discussed. Questions answered. Patient has severe CMC arthritis, has previously had injections and been managing conservatively however her pain is worsening. Referred to hand specialist for evaluation.  Orders:  Orders Placed This Encounter  Procedures   DG Hand Complete Left   DG Hand Complete Right     Subjective: Chief Complaint: Bilateral hand pain  HPI Patient is a 74 y.o. year old female who presents with hand pain involving the bilateral hand. Onset of the symptoms was several years ago. Inciting event: none known. Current symptoms include  pain, swelling. Pain is located at the base of both thumbs.  Treatment to date: Injection into the base of the thumb with no improvement.  Objective: Vital Signs: LMP  (LMP Unknown)   Physical Exam Gen: Alert, No Acute Distress right hand: Skin intact, no erythema or induration noted. There is TTP and deformity at the base of the thumb over the [facility] joint. + grind test. NVID left hand: Skin intact, no erythema or induration noted. There is TTP and deformity at the base of the thumb over the [facility] joint. + grind test. NVID  Imaging: Radiographs personally reviewed by me; reveal severe CMC thumb arthritis of the bilateral hand   PMFS History: Patient Active Problem List   Diagnosis Date Noted   Vitamin D  deficiency 02/29/2024   Irritable bowel syndrome with constipation 08/13/2023   Family history of colon cancer    Stenosis of right carotid artery 03/13/2020   Diastolic dysfunction  03/13/2020   Hyperlipidemia associated with type 2 diabetes mellitus (HCC) 02/15/2020   Type 2 diabetes mellitus without complication, without long-term current use of insulin (HCC) 08/16/2018   Primary generalized (osteo)arthritis 05/17/2018   Allergic rhinitis due to pollen 02/15/2018   Acute non-recurrent sinusitis 01/28/2018   Ovarian failure 01/28/2018   Gastroesophageal reflux disease without esophagitis 10/19/2017   Essential hypertension 10/19/2017   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   Back pain    Left sciatica, low back pain   Bilateral groin pain 02/15/2020   Cataract    Diabetes mellitus without complication (HCC)    GERD (gastroesophageal reflux disease)    Glaucoma    Hypertension    Inflammatory pain of left heel 05/17/2018   Palpitations 10/11/2019   Vitamin D  deficiency     Family History  Problem Relation Age of Onset   Heart disease Sister    Diabetes Sister    Hypertension Sister    Heart disease Brother    Diabetes Brother    Hypertension Brother    Breast cancer Daughter 51   Colon cancer Mother    Stroke Mother    Lung cancer Father     Past Surgical History:  Procedure Laterality Date   ABDOMINAL HYSTERECTOMY  BREAST EXCISIONAL BIOPSY Left 2015   cyst removed   CATARACT EXTRACTION W/PHACO Right 03/27/2020   Procedure: CATARACT EXTRACTION PHACO AND INTRAOCULAR LENS PLACEMENT (IOC) RIGHT 5.03 00:50.6 9.9%;  Surgeon: Mittie Gaskin, MD;  Location: [facility];  Service: Ophthalmology;  Laterality: Right;   CATARACT EXTRACTION W/PHACO Left 04/18/2020   Procedure: CATARACT EXTRACTION PHACO AND INTRAOCULAR LENS PLACEMENT (IOC) LEFT DIABETIC 4.56 00:51.7 8.8%;  Surgeon: Mittie Gaskin, MD;  Location: [facility];  Service: Ophthalmology;  Laterality: Left;   CHOLECYSTECTOMY     COLONOSCOPY N/A 09/25/2014   Procedure: COLONOSCOPY;  Surgeon: Rogelia Copping, MD;  Location:  [facility];  Service: Gastroenterology;  Laterality: N/A;  cecum time- [id number]   COLONOSCOPY WITH PROPOFOL  N/A 07/16/2020   Procedure: COLONOSCOPY WITH PROPOFOL ;  Surgeon: Unk Corinn Skiff, MD;  Location: [facility];  Service: Endoscopy;  Laterality: N/A;   CYST REMOVAL NECK     ESOPHAGOGASTRODUODENOSCOPY N/A 09/25/2014   Procedure: ESOPHAGOGASTRODUODENOSCOPY (EGD);  Surgeon: Rogelia Copping, MD;  Location: [facility];  Service: Gastroenterology;  Laterality: N/A;   ESOPHAGOGASTRODUODENOSCOPY (EGD) WITH PROPOFOL  N/A 04/07/2018   Procedure: ESOPHAGOGASTRODUODENOSCOPY (EGD) WITH PROPOFOL ;  Surgeon: Toledo, Ladell POUR, MD;  Location: [facility];  Service: Gastroenterology;  Laterality: N/A;   POLYPECTOMY  09/25/2014   Procedure: POLYPECTOMY INTESTINAL;  Surgeon: Rogelia Copping, MD;  Location: [facility];  Service: Gastroenterology;;   Social History   Occupational History   Not on file  Tobacco Use   Smoking status: Former    Current packs/day: 0.00    Average packs/day: 0.3 packs/day for 15.0 years (3.8 ttl pk-yrs)    Types: Cigarettes    Start date: 52    Quit date: 62    Years since quitting: 31.0   Smokeless tobacco: Never  Vaping Use   Vaping status: Never Used  Substance and Sexual Activity   Alcohol use: Yes    Comment: 3-5 glasses wine/month   Drug use: Not Currently    Types: Marijuana    Comment: previous many years ago, during young adulthood.   Sexual activity: Not on file   Current Outpatient Medications  Medication Instructions   amLODipine  (NORVASC ) 2.5 MG tablet TAKE 1 TABLET BY MOUTH EVERY NIGHT AT BEDTIME FOR BLOOD PRESSURE   aspirin 81 mg, Daily   Blood Glucose Monitoring Suppl (ONETOUCH VERIO FLEX SYSTEM) w/Device KIT Use as directed DX E11.65   cyanocobalamin  1,000 mcg, Daily   Dialyvite Vitamin D  5000 5,000 Units, Daily   dorzolamide-timolol  (COSOPT) 22.3-6.8 MG/ML ophthalmic solution 1 drop, Every evening   furosemide  (LASIX ) 20  mg, Oral, Daily   ibuprofen  (ADVIL ) 400 MG tablet Take one tab po bid prn for pain   Lancets (ONETOUCH DELICA PLUS LANCET30G) MISC Daily, use as directed   latanoprost (XALATAN) 0.005 % ophthalmic solution 1 drop, Daily at bedtime   lisinopril  (ZESTRIL ) 40 MG tablet TAKE 1 TABLET(40 MG) BY MOUTH DAILY   meclizine  (ANTIVERT ) 12.5-25 mg, Oral, 3 times daily PRN   ONETOUCH VERIO test strip USE FOR TESTING ONCE DAILY AS DIRECTED   pantoprazole  (PROTONIX ) 20 mg, Oral, Daily   rosuvastatin  (CRESTOR ) 5 mg, Oral, Daily   RYBELSUS  7 MG TABS TAKE 1 TABLET BY MOUTH EVERY DAY WITH BREAKFAST ON AN EMPTY STOMACH, WITH NO MORE THAN 4 OUNCES OF WATER , AND WAIT 30 MINUTES BEFORE EATING,   Vitamin D  (Ergocalciferol ) (DRISDOL ) 50,000 Units, Oral, Every 7 days   Allergies as of 06/01/2024   (No Known Allergies)   "

## 2024-06-01 NOTE — Patient Instructions (Signed)

## 2024-06-17 NOTE — Telephone Encounter (Signed)
 Attempted to cal pt, no answer LMOM

## 2024-06-20 ENCOUNTER — Ambulatory Visit: Admitting: Orthopedic Surgery

## 2024-06-22 ENCOUNTER — Ambulatory Visit: Admitting: Orthopedic Surgery

## 2024-06-22 DIAGNOSIS — M180 Bilateral primary osteoarthritis of first carpometacarpal joints: Secondary | ICD-10-CM

## 2024-08-16 ENCOUNTER — Ambulatory Visit: Admitting: Nurse Practitioner

## 2024-08-18 ENCOUNTER — Ambulatory Visit: Admitting: Nurse Practitioner

## 2025-02-20 ENCOUNTER — Ambulatory Visit: Admitting: Nurse Practitioner
